# Patient Record
Sex: Male | Born: 1960 | Race: Black or African American | Marital: Married | Smoking: Former smoker
Health system: Southern US, Community
[De-identification: ages and names within clinical notes are randomized; demographics above are authoritative.]

## PROBLEM LIST (undated history)

## (undated) DIAGNOSIS — K649 Unspecified hemorrhoids: Secondary | ICD-10-CM

## (undated) DIAGNOSIS — E785 Hyperlipidemia, unspecified: Secondary | ICD-10-CM

## (undated) DIAGNOSIS — B019 Varicella without complication: Secondary | ICD-10-CM

## (undated) DIAGNOSIS — I00 Rheumatic fever without heart involvement: Secondary | ICD-10-CM

## (undated) DIAGNOSIS — N390 Urinary tract infection, site not specified: Secondary | ICD-10-CM

## (undated) DIAGNOSIS — E119 Type 2 diabetes mellitus without complications: Secondary | ICD-10-CM

## (undated) HISTORY — DX: Varicella without complication: B01.9

## (undated) HISTORY — PX: COLONOSCOPY: SHX174

## (undated) HISTORY — DX: Hyperlipidemia, unspecified: E78.5

## (undated) HISTORY — DX: Type 2 diabetes mellitus without complications: E11.9

## (undated) HISTORY — DX: Rheumatic fever without heart involvement: I00

## (undated) HISTORY — DX: Unspecified hemorrhoids: K64.9

## (undated) HISTORY — DX: Urinary tract infection, site not specified: N39.0

---

## 2012-03-25 ENCOUNTER — Other Ambulatory Visit: Payer: Self-pay | Admitting: Occupational Medicine

## 2012-03-25 ENCOUNTER — Ambulatory Visit: Payer: Self-pay

## 2012-03-25 DIAGNOSIS — Z021 Encounter for pre-employment examination: Secondary | ICD-10-CM

## 2014-06-09 ENCOUNTER — Encounter: Payer: Self-pay | Admitting: Family

## 2014-06-09 ENCOUNTER — Telehealth: Payer: Self-pay | Admitting: Family

## 2014-06-09 ENCOUNTER — Ambulatory Visit (INDEPENDENT_AMBULATORY_CARE_PROVIDER_SITE_OTHER): Payer: 59 | Admitting: Family

## 2014-06-09 ENCOUNTER — Other Ambulatory Visit (INDEPENDENT_AMBULATORY_CARE_PROVIDER_SITE_OTHER): Payer: 59

## 2014-06-09 VITALS — BP 142/92 | HR 73 | Temp 97.5°F | Resp 18 | Wt 226.0 lb

## 2014-06-09 DIAGNOSIS — M79644 Pain in right finger(s): Secondary | ICD-10-CM

## 2014-06-09 DIAGNOSIS — Z800 Family history of malignant neoplasm of digestive organs: Secondary | ICD-10-CM | POA: Insufficient documentation

## 2014-06-09 DIAGNOSIS — R3589 Other polyuria: Secondary | ICD-10-CM | POA: Insufficient documentation

## 2014-06-09 DIAGNOSIS — R358 Other polyuria: Secondary | ICD-10-CM

## 2014-06-09 DIAGNOSIS — M79646 Pain in unspecified finger(s): Secondary | ICD-10-CM | POA: Insufficient documentation

## 2014-06-09 LAB — PSA: PSA: 0.98 ng/mL (ref 0.10–4.00)

## 2014-06-09 LAB — HEMOGLOBIN A1C: Hgb A1c MFr Bld: 6.6 % — ABNORMAL HIGH (ref 4.6–6.5)

## 2014-06-09 NOTE — Telephone Encounter (Signed)
LVM for pt to call back.

## 2014-06-09 NOTE — Patient Instructions (Addendum)
Thank you for choosing [facility].  Summary/Instructions:  Please stop by the lab on the basement level of the building for your blood work. Your results will be released to [facility] (or called to you) after review, usually within 72 hours after test completion. If any changes need to be made, you will be notified at that same time.  Referrals have been made during this visit. You should expect to hear back from our schedulers in about 10-14 days in regards to establishing an appointment with the specialists we discussed.   If your symptoms worsen or fail to improve, please contact our office for further instruction, or in case of emergency go directly to the emergency room at the closest medical facility.    Urinary Frequency The number of times a normal person urinates depends upon how much liquid they take in and how much liquid they are losing. If the temperature is hot and there is high humidity, then the person will sweat more and usually breathe a little more frequently. These factors decrease the amount of frequency of urination that would be considered normal. The amount you drink is easily determined, but the amount of fluid lost is sometimes more difficult to calculate.  Fluid is lost in two ways:  Sensible fluid loss is usually measured by the amount of urine that you get rid of. Losses of fluid can also occur with diarrhea.  Insensible fluid loss is more difficult to measure. It is caused by evaporation. Insensible loss of fluid occurs through breathing and sweating. It usually ranges from a little less than a quart to a little more than a quart of fluid a day. In normal temperatures and activity levels, the average person may urinate 4 to 7 times in a 24-hour period. Needing to urinate more often than that could indicate a problem. If one urinates 4 to 7 times in 24 hours and has large volumes each time, that could indicate a different problem from one who urinates 4 to 7  times a day and has small volumes. The time of urinating is also important. Most urinating should be done during the waking hours. Getting up at night to urinate frequently can indicate some problems. CAUSES  The bladder is the organ in your lower abdomen that holds urine. Like a balloon, it swells some as it fills up. Your nerves sense this and tell you it is time to head for the bathroom. There are a number of reasons that you might feel the need to urinate more often than usual. They include:  Urinary tract infection. This is usually associated with other signs such as burning when you urinate.  In men, problems with the prostate (a walnut-size gland that is located near the tube that carries urine out of your body). There are two reasons why the prostate can cause an increased frequency of urination:  An enlarged prostate that does not let the bladder empty well. If the bladder only half empties when you urinate, then it only has half the capacity to fill before you have to urinate again.  The nerves in the bladder become more hypersensitive with an increased size of the prostate even if the bladder empties completely.  Pregnancy.  Obesity. Excess weight is more likely to cause a problem for women than for men.  Bladder stones or other bladder problems.  Caffeine.  Alcohol.  Medications. For example, drugs that help the body get rid of extra fluid (diuretics) increase urine production. Some other medicines  must be taken with lots of fluids.  Muscle or nerve weakness. This might be the result of a spinal cord injury, a stroke, multiple sclerosis, or Parkinson disease.  Long-standing diabetes can decrease the sensation of the bladder. This loss of sensation makes it harder to sense the bladder needs to be emptied. Over a period of years, the bladder is stretched out by constant overfilling. This weakens the bladder muscles so that the bladder does not empty well and has less capacity to  fill with new urine.  Interstitial cystitis (also called painful bladder syndrome). This condition develops because the tissues that line the inside of the bladder are inflamed (inflammation is the body's way of reacting to injury or infection). It causes pain and frequent urination. It occurs in women more often than in men. DIAGNOSIS   To decide what might be causing your urinary frequency, your health care provider will probably:  Ask about symptoms you have noticed.  Ask about your overall health. This will include questions about any medications you are taking.  Do a physical examination.  Order some tests. These might include:  A blood test to check for diabetes or other health issues that could be contributing to the problem.  Urine testing. This could measure the flow of urine and the pressure on the bladder.  A test of your neurological system (the brain, spinal cord, and nerves). This is the system that senses the need to urinate.  A bladder test to check whether it is emptying completely when you urinate.  Cystoscopy. This test uses a thin tube with a tiny camera on it. It offers a look inside your urethra and bladder to see if there are problems.  Imaging tests. You might be given a contrast dye and then asked to urinate. X-rays are taken to see how your bladder is working. TREATMENT  It is important for you to be evaluated to determine if the amount or frequency that you have is unusual or abnormal. If it is found to be abnormal, the cause should be determined and this can usually be found out easily. Depending upon the cause, treatment could include medication, stimulation of the nerves, or surgery. There are not too many things that you can do as an individual to change your urinary frequency. It is important that you balance the amount of fluid intake needed to compensate for your activity and the temperature. Medical problems will be diagnosed and taken care of by your  physician. There is no particular bladder training such as Kegel exercises that you can do to help urinary frequency. This is an exercise that is usually recommended for people who have leaking of urine when they laugh, cough, or sneeze. HOME CARE INSTRUCTIONS   Take any medications your health care provider prescribed or suggested. Follow the directions carefully.  Practice any lifestyle changes that are recommended. These might include:  Drinking less fluid or drinking at different times of the day. If you need to urinate often during the night, for example, you may need to stop drinking fluids early in the evening.  Cutting down on caffeine or alcohol. They both can make you need to urinate more often than normal. Caffeine is found in coffee, tea, and sodas.  Losing weight, if that is recommended.  Keep a journal or a log. You might be asked to record how much you drink and when and where you feel the need to urinate. This will also help evaluate how well the treatment  provided by your physician is working. SEEK MEDICAL CARE IF:   Your need to urinate often gets worse.  You feel increased pain or irritation when you urinate.  You notice blood in your urine.  You have questions about any medications that your health care provider recommended.  You notice blood, pus, or swelling at the site of any test or treatment procedure.  You develop a fever of more than 100.61F (38.1C). SEEK IMMEDIATE MEDICAL CARE IF:  You develop a fever of more than 102.98F (38.9C). Document Released: 12/16/2008 Document Revised: 07/06/2013 Document Reviewed: 12/16/2008 [facility] Patient Information 2015 [place], [place]. This information is not intended to replace advice given to you by your health care provider. Make sure you discuss any questions you have with your health care provider.

## 2014-06-09 NOTE — Progress Notes (Signed)
Subjective:    [name], male    DOB: 22-Sep-1960, 54 y.o.   MRN: [id number]  Chief Complaint  Patient presents with  . Establish Care    says he needs colonoscopy, wants to make sure he is not diabetic, also has numbness in right thumb and has pain that runs through it when trying to extend arm    HPI:  Austin Meadows is a 54 y.o. male who presents today to establish care and discuss several concerns.  1) Need for colonoscopy - Has a significant family history for colon cancer; Last colonoscopy was bout 7 years ago and notes it was normal. Recommend for him to follow up in about 5 years. Would like to be referred for colonoscopy  2) Concern for diabetes - Associated symptoms of frequent urination and some fatigue has been going on for about the last year. Family history is significant for father with Type II diabetes. Also notes that his vision gets blurry on occasion when he is in front of monitors/TV too long.  3) Thumb pain - Associated symptom of pain located in the his right thumb has been going on for about 2 months. Describes the pain as shooting on occasion and notes it most when extends his thumb and arm. Pain is there for a moment and then goes away. Denies any trauma to his thumb or upper extremity.   Allergies  Allergen Reactions  . Penicillins     No current outpatient prescriptions on file prior to visit.   No current facility-administered medications on file prior to visit.    Past Medical History  Diagnosis Date  . Chicken pox   . Jaundice of newborn   . Hyperlipidemia   . Rheumatic fever   . UTI (lower urinary tract infection)     History reviewed. No pertinent past surgical history.  Family History  Problem Relation Age of Onset  . Arthritis Mother   . Colon cancer Father   . Prostate cancer Father   . Heart disease Father   . Hypertension Father   . Diabetes Father     History   Social History  . Marital Status: Married    Spouse  Name: N/A  . Number of Children: N/A  . Years of Education: N/A   Occupational History  . Chemical Operator    Social History Main Topics  . Smoking status: Former Smoker    Quit date: 06/04/1994  . Smokeless tobacco: Never Used  . Alcohol Use: 0.0 oz/week    0 Standard drinks or equivalent per week     Comment: Occasionally  . Drug Use: No  . Sexual Activity: Not on file   Other Topics Concern  . Not on file   Social History Narrative    Review of Systems  Constitutional: Positive for fatigue. Negative for fever and chills.  Eyes:       Positive for changes in his vision when looking at screens for long periods.   Endocrine: Positive for polyuria. Negative for polydipsia and polyphagia.  Musculoskeletal:       Positive for pain in right thumb.   Neurological: Negative for numbness.      Objective:    BP 142/92 mmHg  Pulse 73  Temp(Src) 97.5 F (36.4 C) (Oral)  Resp 18  Wt 226 lb (102.513 kg)  SpO2 98% Nursing note and vital signs reviewed.  Physical Exam  Constitutional: He is oriented to person, place, and time. He appears well-developed  and well-nourished. No distress.  Cardiovascular: Normal rate, regular rhythm, normal heart sounds and intact distal pulses.   Pulmonary/Chest: Effort normal and breath sounds normal.  Musculoskeletal:  Right thumb: No obvious deformity, discoloration, or edema of right thumb noted. No palpable tenderness able to be elicited. Range of motion is complete. Strength is 5+. Ligamentous testing is negative. Negative Finkelstein's. Unable to elicit symptoms described by patient.  Neurological: He is alert and oriented to person, place, and time.  Skin: Skin is warm and dry.  Psychiatric: He has a normal mood and affect. His behavior is normal. Judgment and thought content normal.       Assessment & Plan:

## 2014-06-09 NOTE — Telephone Encounter (Signed)
Please inform the patient that his PSA was normal and his A1c which checks for diabetes was 6.6 indicating that he does have Type 2 diabetes. For this I have started him on Metformin will be sent to his pharmacy, but we need to know which one. I would like him to follow up in 2 weeks to discuss this diagnosis.

## 2014-06-09 NOTE — Progress Notes (Signed)
Pre visit review using our clinic review tool, if applicable. No additional management support is needed unless otherwise documented below in the visit note. 

## 2014-06-09 NOTE — Assessment & Plan Note (Signed)
Obtain A1c and PSA to rule out diabetes and prostate as cause of urinary frequency. Discussed caffeine usage and to decrease caffeine as much as possible. Follow-up pending lab work.

## 2014-06-09 NOTE — Assessment & Plan Note (Signed)
No obvious cause for patient's sharp right thumb pain. Continue to monitor at this time with no obvious pathology. If symptoms worsen or fail to improve we'll consider imaging.

## 2014-06-09 NOTE — Assessment & Plan Note (Signed)
Patient has family history of colon cancer. Last colonoscopy was approximately 7 years ago. Refer to GI for Colonoscopy.

## 2014-06-10 MED ORDER — METFORMIN HCL 500 MG PO TABS: 500.0000 mg | ORAL_TABLET | Freq: Two times a day (BID) | ORAL | Status: DC

## 2014-06-10 NOTE — Telephone Encounter (Signed)
Pt aware of results. I have put in the pharmacy that he would like metformin sent into.

## 2014-06-10 NOTE — Telephone Encounter (Signed)
Metformin sent to listed pharmacy.

## 2014-06-24 ENCOUNTER — Ambulatory Visit (INDEPENDENT_AMBULATORY_CARE_PROVIDER_SITE_OTHER): Payer: 59 | Admitting: Family

## 2014-06-24 ENCOUNTER — Encounter: Payer: Self-pay | Admitting: Family

## 2014-06-24 DIAGNOSIS — E1165 Type 2 diabetes mellitus with hyperglycemia: Secondary | ICD-10-CM | POA: Insufficient documentation

## 2014-06-24 DIAGNOSIS — E119 Type 2 diabetes mellitus without complications: Secondary | ICD-10-CM

## 2014-06-24 LAB — GLUCOSE, POCT (MANUAL RESULT ENTRY): POC Glucose: 89 mg/dl (ref 70–99)

## 2014-06-24 MED ORDER — GLUCOSE BLOOD VI STRP: ORAL_STRIP | Status: DC

## 2014-06-24 MED ORDER — METFORMIN HCL 500 MG PO TABS: 500.0000 mg | ORAL_TABLET | Freq: Two times a day (BID) | ORAL | Status: DC

## 2014-06-24 NOTE — Assessment & Plan Note (Signed)
In office blood sugar was 89. Patient indicates some improvement with the polyuria. He is due for diabetic eye exam. Referral to ophthalmology placed. Diabetic foot exam completed. One Touch meter provided to patient with instructions on use. Discussed preventative exams and goals of preventing end organ damage by controlling blood sugar. Follow-up A1c in 3 months.

## 2014-06-24 NOTE — Progress Notes (Signed)
   Subjective:    [name], male    DOB: 10-20-1960, 54 y.o.   MRN: [id number]  Chief Complaint  Patient presents with  . Follow-up    Diabetes     HPI:  Austin Meadows is a 54 y.o. male with a PMH of Type 2 diabetes who presents today for follow up.  Previously newly diagnosed with diabetes with an A1c of 6.6, who was experiencing polyuria and started on metformin. Indicates that he takes his medication as prescribed. Denies any adverse effects of the medication. He indicates that he does have some improvement in his polyuria previously experienced.   Allergies  Allergen Reactions  . Penicillins     Current Outpatient Prescriptions on File Prior to Visit  Medication Sig Dispense Refill  . metFORMIN (GLUCOPHAGE) 500 MG tablet Take 1 tablet (500 mg total) by mouth 2 (two) times daily with a meal. 60 tablet 0   No current facility-administered medications on file prior to visit.    Review of Systems  Eyes:       Negative for changes in vision  Respiratory: Negative for apnea and shortness of breath.   Cardiovascular: Negative for chest pain, palpitations and leg swelling.  Endocrine: Negative for polydipsia and polyphagia.  Neurological: Negative for numbness.      Objective:    BP 138/88 mmHg  Pulse 78  Temp(Src) 98.6 F (37 C) (Oral)  Resp 18  Ht 5\' 11"  (1.803 m)  Wt 226 lb 1.9 oz (102.567 kg)  BMI 31.55 kg/m2  SpO2 97% Nursing note and vital signs reviewed.  Physical Exam  Constitutional: He is oriented to person, place, and time. He appears well-developed and well-nourished. No distress.  Cardiovascular: Normal rate, regular rhythm, normal heart sounds and intact distal pulses.   Pulmonary/Chest: Effort normal and breath sounds normal.  Neurological: He is alert and oriented to person, place, and time.  Diabetic foot exam - bilateral feet are free from skin breakdown, cuts, and abrasions. Toenails are neatly trimmed. Pulses are intact and appropriate.  Sensation is intact to monofilament bilaterally.  Skin: Skin is warm and dry.  Psychiatric: He has a normal mood and affect. His behavior is normal. Judgment and thought content normal.       Assessment & Plan:

## 2014-06-24 NOTE — Progress Notes (Signed)
Pre visit review using our clinic review tool, if applicable. No additional management support is needed unless otherwise documented below in the visit note. 

## 2014-06-24 NOTE — Patient Instructions (Addendum)
Thank you for choosing [facility].  Summary/Instructions:  Please take your blood sugar 1-2 times per week in the morning.  Anything less than 130 is good and less than 100 is great!  Type 2 Diabetes Mellitus Type 2 diabetes mellitus, often simply referred to as type 2 diabetes, is a long-lasting (chronic) disease. In type 2 diabetes, the pancreas does not make enough insulin (a hormone), the cells are less responsive to the insulin that is made (insulin resistance), or both. Normally, insulin moves sugars from food into the tissue cells. The tissue cells use the sugars for energy. The lack of insulin or the lack of normal response to insulin causes excess sugars to build up in the blood instead of going into the tissue cells. As a result, high blood sugar (hyperglycemia) develops. The effect of high sugar (glucose) levels can cause many complications. Type 2 diabetes was also previously called adult-onset diabetes, but it can occur at any age.  RISK FACTORS  A person is predisposed to developing type 2 diabetes if someone in the family has the disease and also has one or more of the following primary risk factors:  Overweight.  An inactive lifestyle.  A history of consistently eating high-calorie foods. Maintaining a normal weight and regular physical activity can reduce the chance of developing type 2 diabetes. SYMPTOMS  A person with type 2 diabetes may not show symptoms initially. The symptoms of type 2 diabetes appear slowly. The symptoms include:  Increased thirst (polydipsia).  Increased urination (polyuria).  Increased urination during the night (nocturia).  Weight loss. This weight loss may be rapid.  Frequent, recurring infections.  Tiredness (fatigue).  Weakness.  Vision changes, such as blurred vision.  Fruity smell to your breath.  Abdominal pain.  Nausea or vomiting.  Cuts or bruises which are slow to heal.  Tingling or numbness in the hands or  feet. DIAGNOSIS Type 2 diabetes is frequently not diagnosed until complications of diabetes are present. Type 2 diabetes is diagnosed when symptoms or complications are present and when blood glucose levels are increased. Your blood glucose level may be checked by one or more of the following blood tests:  A fasting blood glucose test. You will not be allowed to eat for at least 8 hours before a blood sample is taken.  A random blood glucose test. Your blood glucose is checked at any time of the day regardless of when you ate.  A hemoglobin A1c blood glucose test. A hemoglobin A1c test provides information about blood glucose control over the previous 3 months.  An oral glucose tolerance test (OGTT). Your blood glucose is measured after you have not eaten (fasted) for 2 hours and then after you drink a glucose-containing beverage. TREATMENT   You may need to take insulin or diabetes medicine daily to keep blood glucose levels in the desired range.  If you use insulin, you may need to adjust the dosage depending on the carbohydrates that you eat with each meal or snack. The treatment goal is to maintain the before meal blood sugar (preprandial glucose) level at 70-130 mg/dL. HOME CARE INSTRUCTIONS   Have your hemoglobin A1c level checked twice a year.  Perform daily blood glucose monitoring as directed by your health care provider.  Monitor urine ketones when you are ill and as directed by your health care provider.  Take your diabetes medicine or insulin as directed by your health care provider to maintain your blood glucose levels in the desired range.  Never run out of diabetes medicine or insulin. It is needed every day.  If you are using insulin, you may need to adjust the amount of insulin given based on your intake of carbohydrates. Carbohydrates can raise blood glucose levels but need to be included in your diet. Carbohydrates provide vitamins, minerals, and fiber which are an  essential part of a healthy diet. Carbohydrates are found in fruits, vegetables, whole grains, dairy products, legumes, and foods containing added sugars.  Eat healthy foods. You should make an appointment to see a registered dietitian to help you create an eating plan that is right for you.  Lose weight if you are overweight.  Carry a medical alert card or wear your medical alert jewelry.  Carry a 15-gram carbohydrate snack with you at all times to treat low blood glucose (hypoglycemia). Some examples of 15-gram carbohydrate snacks include:  Glucose tablets, 3 or 4.  Glucose gel, 15-gram tube.  Raisins, 2 tablespoons (24 grams).  Jelly beans, 6.  Animal crackers, 8.  Regular pop, 4 ounces (120 mL).  Gummy treats, 9.  Recognize hypoglycemia. Hypoglycemia occurs with blood glucose levels of 70 mg/dL and below. The risk for hypoglycemia increases when fasting or skipping meals, during or after intense exercise, and during sleep. Hypoglycemia symptoms can include:  Tremors or shakes.  Decreased ability to concentrate.  Sweating.  Increased heart rate.  Headache.  Dry mouth.  Hunger.  Irritability.  Anxiety.  Restless sleep.  Altered speech or coordination.  Confusion.  Treat hypoglycemia promptly. If you are alert and able to safely swallow, follow the 15:15 rule:  Take 15-20 grams of rapid-acting glucose or carbohydrate. Rapid-acting options include glucose gel, glucose tablets, or 4 ounces (120 mL) of fruit juice, regular soda, or low-fat milk.  Check your blood glucose level 15 minutes after taking the glucose.  Take 15-20 grams more of glucose if the repeat blood glucose level is still 70 mg/dL or below.  Eat a meal or snack within 1 hour once blood glucose levels return to normal.  Be alert to feeling very thirsty and urinating more frequently than usual, which are early signs of hyperglycemia. An early awareness of hyperglycemia allows for prompt  treatment. Treat hyperglycemia as directed by your health care provider.  Engage in at least 150 minutes of moderate-intensity physical activity a week, spread over at least 3 days of the week or as directed by your health care provider. In addition, you should engage in resistance exercise at least 2 times a week or as directed by your health care provider. Try to spend no more than 90 minutes at one time inactive.  Adjust your medicine and food intake as needed if you start a new exercise or sport.  Follow your sick-day plan anytime you are unable to eat or drink as usual.  Do not use any tobacco products including cigarettes, chewing tobacco, or electronic cigarettes. If you need help quitting, ask your health care provider.  Limit alcohol intake to no more than 1 drink per day for nonpregnant women and 2 drinks per day for men. You should drink alcohol only when you are also eating food. Talk with your health care provider whether alcohol is safe for you. Tell your health care provider if you drink alcohol several times a week.  Keep all follow-up visits as directed by your health care provider. This is important.  Schedule an eye exam soon after the diagnosis of type 2 diabetes and then annually.  Perform  daily skin and foot care. Examine your skin and feet daily for cuts, bruises, redness, nail problems, bleeding, blisters, or sores. A foot exam by a health care provider should be done annually.  Brush your teeth and gums at least twice a day and floss at least once a day. Follow up with your dentist regularly.  Share your diabetes management plan with your workplace or school.  Stay up-to-date with immunizations. It is recommended that people with diabetes who are over 27 years old get the pneumonia vaccine. In some cases, two separate shots may be given. Ask your health care provider if your pneumonia vaccination is up-to-date.  Learn to manage stress.  Obtain ongoing diabetes  education and support as needed.  Participate in or seek rehabilitation as needed to maintain or improve independence and quality of life. Request a physical or occupational therapy referral if you are having foot or hand numbness, or difficulties with grooming, dressing, eating, or physical activity. SEEK MEDICAL CARE IF:   You are unable to eat food or drink fluids for more than 6 hours.  You have nausea and vomiting for more than 6 hours.  Your blood glucose level is over 240 mg/dL.  There is a change in mental status.  You develop an additional serious illness.  You have diarrhea for more than 6 hours.  You have been sick or have had a fever for a couple of days and are not getting better.  You have pain during any physical activity.  SEEK IMMEDIATE MEDICAL CARE IF:  You have difficulty breathing.  You have moderate to large ketone levels. MAKE SURE YOU:  Understand these instructions.  Will watch your condition.  Will get help right away if you are not doing well or get worse. Document Released: 02/19/2005 Document Revised: 07/06/2013 Document Reviewed: 09/18/2011 [facility] Patient Information 2015 [place], [place]. This information is not intended to replace advice given to you by your health care provider. Make sure you discuss any questions you have with your health care provider.  Diabetes and Exercise Exercising regularly is important. It is not just about losing weight. It has many health benefits, such as:  Improving your overall fitness, flexibility, and endurance.  Increasing your bone density.  Helping with weight control.  Decreasing your body fat.  Increasing your muscle strength.  Reducing stress and tension.  Improving your overall health. People with diabetes who exercise gain additional benefits because exercise:  Reduces appetite.  Improves the body's use of blood sugar (glucose).  Helps lower or control blood glucose.  Decreases blood  pressure.  Helps control blood lipids (such as cholesterol and triglycerides).  Improves the body's use of the hormone insulin by:  Increasing the body's insulin sensitivity.  Reducing the body's insulin needs.  Decreases the risk for heart disease because exercising:  Lowers cholesterol and triglycerides levels.  Increases the levels of good cholesterol (such as high-density lipoproteins [HDL]) in the body.  Lowers blood glucose levels. YOUR ACTIVITY PLAN  Choose an activity that you enjoy and set realistic goals. Your health care provider or diabetes educator can help you make an activity plan that works for you. Exercise regularly as directed by your health care provider. This includes:  Performing resistance training twice a week such as push-ups, sit-ups, lifting weights, or using resistance bands.  Performing 150 minutes of cardio exercises each week such as walking, running, or playing sports.  Staying active and spending no more than 90 minutes at one time being inactive.  Even short bursts of exercise are good for you. Three 10-minute sessions spread throughout the day are just as beneficial as a single 30-minute session. Some exercise ideas include:  Taking the dog for a walk.  Taking the stairs instead of the elevator.  Dancing to your favorite song.  Doing an exercise video.  Doing your favorite exercise with a friend. RECOMMENDATIONS FOR EXERCISING WITH TYPE 1 OR TYPE 2 DIABETES   Check your blood glucose before exercising. If blood glucose levels are greater than 240 mg/dL, check for urine ketones. Do not exercise if ketones are present.  Avoid injecting insulin into areas of the body that are going to be exercised. For example, avoid injecting insulin into:  The arms when playing tennis.  The legs when jogging.  Keep a record of:  Food intake before and after you exercise.  Expected peak times of insulin action.  Blood glucose levels before and after  you exercise.  The type and amount of exercise you have done.  Review your records with your health care provider. Your health care provider will help you to develop guidelines for adjusting food intake and insulin amounts before and after exercising.  If you take insulin or oral hypoglycemic agents, watch for signs and symptoms of hypoglycemia. They include:  Dizziness.  Shaking.  Sweating.  Chills.  Confusion.  Drink plenty of water while you exercise to prevent dehydration or heat stroke. Body water is lost during exercise and must be replaced.  Talk to your health care provider before starting an exercise program to make sure it is safe for you. Remember, almost any type of activity is better than none. Document Released: 05/12/2003 Document Revised: 07/06/2013 Document Reviewed: 07/29/2012 [facility] Patient Information 2015 [place], [place]. This information is not intended to replace advice given to you by your health care provider. Make sure you discuss any questions you have with your health care provider.

## 2014-06-25 ENCOUNTER — Encounter: Payer: Self-pay | Admitting: Family

## 2014-07-22 ENCOUNTER — Ambulatory Visit (AMBULATORY_SURGERY_CENTER): Payer: Self-pay | Admitting: *Deleted

## 2014-07-22 VITALS — Ht 72.0 in | Wt 223.0 lb

## 2014-07-22 DIAGNOSIS — Z800 Family history of malignant neoplasm of digestive organs: Secondary | ICD-10-CM

## 2014-07-22 MED ORDER — NA SULFATE-K SULFATE-MG SULF 17.5-3.13-1.6 GM/177ML PO SOLN: ORAL | Status: DC

## 2014-07-22 NOTE — Progress Notes (Signed)
Patient denies any allergies to eggs or soy. Patient denies any problems with anesthesia/sedation. Patient denies any oxygen use at home and does not take any diet/weight loss medications. EMMI education assisgned to patient on colonoscopy, this was explained and instructions given to patient. 

## 2014-08-05 ENCOUNTER — Encounter: Payer: Self-pay | Admitting: Gastroenterology

## 2014-08-05 ENCOUNTER — Ambulatory Visit (AMBULATORY_SURGERY_CENTER): Payer: 59 | Admitting: Gastroenterology

## 2014-08-05 VITALS — BP 138/61 | HR 69 | Temp 97.1°F | Resp 16 | Ht 72.0 in | Wt 223.0 lb

## 2014-08-05 DIAGNOSIS — Z800 Family history of malignant neoplasm of digestive organs: Secondary | ICD-10-CM | POA: Diagnosis present

## 2014-08-05 DIAGNOSIS — Z1211 Encounter for screening for malignant neoplasm of colon: Secondary | ICD-10-CM

## 2014-08-05 MED ORDER — SODIUM CHLORIDE 0.9 % IV SOLN: 500.0000 mL | INTRAVENOUS | Status: DC

## 2014-08-05 NOTE — Patient Instructions (Signed)
YOU HAD AN ENDOSCOPIC PROCEDURE TODAY AT THE [facility]:   Refer to the procedure report that was given to you for any specific questions about what was found during the examination.  If the procedure report does not answer your questions, please call your gastroenterologist to clarify.  If you requested that your care partner not be given the details of your procedure findings, then the procedure report has been included in a sealed envelope for you to review at your convenience later.  YOU SHOULD EXPECT: Some feelings of bloating in the abdomen. Passage of more gas than usual.  Walking can help get rid of the air that was put into your GI tract during the procedure and reduce the bloating. If you had a lower endoscopy (such as a colonoscopy or flexible sigmoidoscopy) you may notice spotting of blood in your stool or on the toilet paper. If you underwent a bowel prep for your procedure, you may not have a normal bowel movement for a few days.  Please Note:  You might notice some irritation and congestion in your nose or some drainage.  This is from the oxygen used during your procedure.  There is no need for concern and it should clear up in a day or so.  SYMPTOMS TO REPORT IMMEDIATELY:   Following lower endoscopy (colonoscopy or flexible sigmoidoscopy):  Excessive amounts of blood in the stool  Significant tenderness or worsening of abdominal pains  Swelling of the abdomen that is new, acute  Fever of 100F or higher   For urgent or emergent issues, a gastroenterologist can be reached at any hour by calling [phone].   DIET: Your first meal following the procedure should be a small meal and then it is ok to progress to your normal diet. Heavy or fried foods are harder to digest and may make you feel nauseous or bloated.  Likewise, meals heavy in dairy and vegetables can increase bloating.  Drink plenty of fluids but you should avoid alcoholic beverages for 24  hours.  ACTIVITY:  You should plan to take it easy for the rest of today and you should NOT DRIVE or use heavy machinery until tomorrow (because of the sedation medicines used during the test).    FOLLOW UP: Our staff will call the number listed on your records the next business day following your procedure to check on you and address any questions or concerns that you may have regarding the information given to you following your procedure. If we do not reach you, we will leave a message.  However, if you are feeling well and you are not experiencing any problems, there is no need to return our call.  We will assume that you have returned to your regular daily activities without incident.  If any biopsies were taken you will be contacted by phone or by letter within the next 1-3 weeks.  Please call us at [phone] if you have not heard about the biopsies in 3 weeks.    SIGNATURES/CONFIDENTIALITY: You and/or your care partner have signed paperwork which will be entered into your electronic medical record.  These signatures attest to the fact that that the information above on your After Visit Summary has been reviewed and is understood.  Full responsibility of the confidentiality of this discharge information lies with you and/or your care-partner. 

## 2014-08-05 NOTE — Op Note (Signed)
[place]  [address] [place], [id number]   COLONOSCOPY PROCEDURE REPORT  PATIENT: Austin Meadows, Austin Meadows  MR#: [id number] BIRTHDATE: 19-Mar-1960 , 53  yrs. old GENDER: male ENDOSCOPIST: Ladene Artist, MD, [facility] REFERRED BY: Mauricio Po, FNP PROCEDURE DATE:  08/05/2014 PROCEDURE:   Colonoscopy, screening First Screening Colonoscopy - Avg.  risk and is 50 yrs.  old or older - No.  Prior Negative Screening - Now for repeat screening. 10 or more years since last screening  History of Adenoma - Now for follow-up colonoscopy & has been > or = to 3 yrs.  N/A  Polyps removed today? No Recommend repeat exam, <10 yrs? Yes high risk ASA CLASS:   Class II INDICATIONS:Screening for colonic neoplasia and FH Colon or Rectal Adenocarcinoma. MEDICATIONS: Monitored anesthesia care and Propofol 200 mg IV DESCRIPTION OF PROCEDURE:   After the risks benefits and alternatives of the procedure were thoroughly explained, informed consent was obtained.  The digital rectal exam revealed no abnormalities of the rectum and revealed external hemorrhoids. The LB Olympus Loaner [id number]  endoscope was introduced through the anus and advanced to the cecum, which was identified by both the appendix and ileocecal valve. No adverse events experienced.   The quality of the prep was excellent.  (Suprep was used)  The instrument was then slowly withdrawn as the colon was fully examined. Estimated blood loss is zero unless otherwise noted in this procedure report.    COLON FINDINGS: A normal appearing cecum, ileocecal valve, and appendiceal orifice were identified.  The ascending, transverse, descending, sigmoid colon, and rectum appeared unremarkable. Retroflexed views revealed internal Grade I hemorrhoids and Retroflexed views revealed external hemorrhoids. The time to cecum = 0.9 Withdrawal time = 8.8   The scope was withdrawn and the procedure completed. COMPLICATIONS: There were no immediate  complications.  ENDOSCOPIC IMPRESSION: 1.  Normal colonoscopy 2.  Internal and external hemorrhoids  RECOMMENDATIONS: 1.  Repeat Colonoscopy in 5 years.  eSigned:  Ladene Artist, MD, [facility] 08/05/2014 9:15 AM

## 2014-08-05 NOTE — Progress Notes (Signed)
Report to PACU, RN, vss, BBS= Clear.  

## 2014-08-06 ENCOUNTER — Telehealth: Payer: Self-pay | Admitting: *Deleted

## 2014-08-06 NOTE — Telephone Encounter (Signed)
  Follow up Call-  No answer, left message to call if questions or concerns.    

## 2014-11-02 ENCOUNTER — Other Ambulatory Visit: Payer: Self-pay | Admitting: Family

## 2014-11-30 ENCOUNTER — Encounter: Payer: Self-pay | Admitting: Family

## 2014-11-30 ENCOUNTER — Other Ambulatory Visit (INDEPENDENT_AMBULATORY_CARE_PROVIDER_SITE_OTHER): Payer: 59

## 2014-11-30 ENCOUNTER — Ambulatory Visit (INDEPENDENT_AMBULATORY_CARE_PROVIDER_SITE_OTHER): Payer: 59 | Admitting: Family

## 2014-11-30 VITALS — BP 128/94 | HR 85 | Temp 98.7°F | Resp 18 | Ht 71.0 in | Wt 222.6 lb

## 2014-11-30 DIAGNOSIS — R202 Paresthesia of skin: Secondary | ICD-10-CM | POA: Diagnosis not present

## 2014-11-30 DIAGNOSIS — R200 Anesthesia of skin: Secondary | ICD-10-CM

## 2014-11-30 LAB — CBC
HEMATOCRIT: 45.7 % (ref 39.0–52.0)
HEMOGLOBIN: 15.1 g/dL (ref 13.0–17.0)
MCHC: 33.2 g/dL (ref 30.0–36.0)
MCV: 83.8 fl (ref 78.0–100.0)
PLATELETS: 255 10*3/uL (ref 150.0–400.0)
RBC: 5.45 Mil/uL (ref 4.22–5.81)
RDW: 13.6 % (ref 11.5–15.5)
WBC: 7.1 10*3/uL (ref 4.0–10.5)

## 2014-11-30 LAB — TSH: TSH: 2.03 u[IU]/mL (ref 0.35–4.50)

## 2014-11-30 LAB — IBC PANEL
Iron: 84 ug/dL (ref 42–165)
Saturation Ratios: 20.2 % (ref 20.0–50.0)
Transferrin: 297 mg/dL (ref 212.0–360.0)

## 2014-11-30 LAB — FERRITIN: Ferritin: 76.2 ng/mL (ref 22.0–322.0)

## 2014-11-30 LAB — B12 AND FOLATE PANEL
Folate: 21.5 ng/mL (ref >5.9)
Vitamin B-12: 325 pg/mL (ref 211–911)

## 2014-11-30 LAB — HEMOGLOBIN A1C: Hgb A1c MFr Bld: 6 % (ref 4.6–6.5)

## 2014-11-30 NOTE — Patient Instructions (Addendum)
Thank you for choosing [facility].  Summary/Instructions:   Please stop by the lab on the basement level of the building for your blood work. Your results will be released to [place] (or called to you) after review, usually within 72 hours after test completion. If any changes need to be made, you will be notified at that same time.  If your symptoms worsen or fail to improve, please contact our office for further instruction, or in case of emergency go directly to the emergency room at the closest medical facility.   Vitamin B12 Deficiency Not having enough vitamin B12 is called a deficiency. Vitamin B12 is an important vitamin. Your body needs vitamin B12 to:   Make red blood cells.  Make DNA. This is the genetic material inside all of your cells.  Help your nerves work properly so they can carry messages from your brain to your body. CAUSES  Not eating enough foods that contain vitamin B12.  Not having enough stomach acid and digestive juices. The body needs these to absorb vitamin B12 from the food you eat.  Having certain digestive system diseases that make it hard to absorb vitamin B12. These diseases include Crohn's disease, chronic pancreatitis, and cystic fibrosis.  Having pernicious anemia, which is a condition where the body has too few red blood cells. People with this condition do not make enough of a protein called "intrinsic factor," which is needed to absorb vitamin B12.  Having a surgery in which part of the stomach or small intestine is removed.  Taking certain medicines that make it hard for the body to absorb vitamin B12. These medicines include:  Heartburn medicine (antacids and proton pump inhibitors).  A certain antibiotic medicine called neomycin, which fights infection.  Some medicines used to treat diabetes, tuberculosis, gout, and high cholesterol. RISK FACTORS Risk factors are things that make you more likely to develop a vitamin B12  deficiency. They include:  Being older than 45.  Being a vegetarian.  Being pregnant and a vegetarian or having a poor diet.  Taking certain drugs.  Being an alcoholic. SYMPTOMS You may have a vitamin B12 deficiency with no symptoms. However, a vitamin B12 deficiency can cause health problems like anemia and nerve damage. These health problems can lead to many possible symptoms, including:  Weakness.  Fatigue.  Loss of appetite.  Weight loss.  Numbness or tingling in your hands and feet.  Redness and burning of the tongue.  Confusion or memory problems.  Depression.  Dizziness.  Sensory problems, such as loss of taste, color blindness, and ringing in the ears.  Diarrhea or constipation.  Trouble walking. DIAGNOSIS Various types of tests can be given to help find the cause of your vitamin B12 deficiency. These tests include:  A complete blood count (CBC). This test gives your caregiver an overall picture of what makes up your blood.  A blood test to measure your B12 level.  A blood test to measure intrinsic factor.  An endoscopy. This procedure uses a thin tube with a camera on the end to look into your stomach or intestines. TREATMENT Treatment for vitamin B12 deficiency depends on what is causing it. Common options include:  Changing your eating and drinking habits, such as:  Eating more foods that contain vitamin B12.  Not drinking as much alcohol or any alcohol.  Taking vitamin B12 supplements. Your caregiver will tell you what dose is best for you.  Getting vitamin B12 injections. Some people get these a  few times a week. Others get them once a month. HOME CARE INSTRUCTIONS  Take all supplements as directed by your caregiver. Follow the directions carefully.  Get any injections your caregiver prescribes. Do not miss your appointments.  Eat lots of healthy foods that contain vitamin B12. Ask your caregiver if you should work with a nutritionist.  Good things to include in your diet are:  Meat.  Poultry.  Fish.  Eggs.  Fortified cereal and dairy products. This means vitamin B12 has been added to the food. Check the label on the package to be sure.  Do not abuse alcohol.  Keep all follow-up appointments. Your caregiver will need to perform blood tests to make sure your vitamin B12 deficiency is going away. SEEK MEDICAL CARE IF:  You have any questions about your treatment.  Your symptoms come back. MAKE SURE YOU:  Understand these instructions.  Will watch your condition.  Will get help right away if you are not doing well or get worse. Document Released: 05/14/2011 Document Reviewed: 05/14/2011 [facility] Patient Information 2015 [place]. This information is not intended to replace advice given to you by your health care provider. Make sure you discuss any questions you have with your health care provider.

## 2014-11-30 NOTE — Progress Notes (Signed)
Subjective:    [name], male    DOB: [birth year], 54 y.o.   MRN: [id number]  Chief Complaint  Patient presents with  . Numbness    x2 weeks, has had tingling in fingers and toes, also his tongue, at one point hands and feet were completely numb, the tingling has pretty much been non stop    HPI:  Austin Meadows is a 54 y.o. male who  has a past medical history of Chicken pox; Jaundice of newborn; Hyperlipidemia; Rheumatic fever; UTI (lower urinary tract infection); Hemorrhoid; and Diabetes mellitus without complication. and presents today for a follow up office visit.  Numbness - This is a new problem. Associated symptom of numbness and tingling located in his fingers, toes and tongue has been going on for about 2 weeks. The severity of the symptoms peaked with his feet being completely numb at one point. Reports the symptoms are constant. Modifying factors include trying to eat well and control his diabetes. Reports that his blood sugars are less than 100 on average. Does eat chicken but does not eat a lot of beef.    Lab Results  Component Value Date   HGBA1C 6.6* 06/09/2014     Allergies  Allergen Reactions  . Penicillins Other (See Comments)    Pt can't remember reaction.     Current Outpatient Prescriptions on File Prior to Visit  Medication Sig Dispense Refill  . glucose blood (ONETOUCH VERIO) test strip Use as instructed 100 each 12  . metFORMIN (GLUCOPHAGE) 500 MG tablet TAKE ONE TABLET BY MOUTH TWICE DAILY WITH MEALS 60 tablet 0  . Na Sulfate-K Sulfate-Mg Sulf SOLN Suprep= (no substitutions)-TAKE AS DIRECTED. 354 mL 0   No current facility-administered medications on file prior to visit.     Past Surgical History  Procedure Laterality Date  . Colonoscopy  ages [phone]    done at [facility] and [facility](pt unable to get records)     Review of Systems  Constitutional: Negative for fever and chills.  Eyes:       Negative for changes in  vision.  Respiratory: Negative for chest tightness and shortness of breath.   Cardiovascular: Negative for chest pain, palpitations and leg swelling.  Endocrine: Negative for polydipsia, polyphagia and polyuria.  Neurological: Positive for numbness. Negative for weakness.      Objective:    BP 128/94 mmHg  Pulse 85  Temp(Src) 98.7 F (37.1 C) (Oral)  Resp 18  Ht 5\' 11"  (1.803 m)  Wt 222 lb 9.6 oz (100.971 kg)  BMI 31.06 kg/m2  SpO2 98% Nursing note and vital signs reviewed.  Physical Exam  Constitutional: He is oriented to person, place, and time. He appears well-developed and well-nourished. No distress.  Cardiovascular: Normal rate, regular rhythm, normal heart sounds and intact distal pulses.   Pulmonary/Chest: Effort normal and breath sounds normal.  Neurological: He is alert and oriented to person, place, and time. He has normal reflexes. No cranial nerve deficit.  Upper and lower muscle extremity strength and sensation is normal with some numbess as indicated by patient.   Skin: Skin is warm and dry.  Psychiatric: He has a normal mood and affect. His behavior is normal. Judgment and thought content normal.       Assessment & Plan:   Problem List Items Addressed This Visit      Other   Numbness and tingling - Primary    Numbness and tingling in multiple extremities and his tongue  consistent with possible B12 deficiency, however cannot rule out other metabolic causes including diabetes, thyroid or iron deficiency. Obtain TSH, methylmalonic acid, IBC panel, A1c, Ferritin, CBC and B12/Folate. Continue to monitor blood sugars and current dose of metformin. Follow up pending lab work.       Relevant Orders   B12 and Folate Panel   Methylmalonic acid, serum   IBC panel   TSH   Hemoglobin A1c   CBC   Ferritin

## 2014-11-30 NOTE — Progress Notes (Signed)
Pre visit review using our clinic review tool, if applicable. No additional management support is needed unless otherwise documented below in the visit note. 

## 2014-11-30 NOTE — Assessment & Plan Note (Signed)
Numbness and tingling in multiple extremities and his tongue consistent with possible B12 deficiency, however cannot rule out other metabolic causes including diabetes, thyroid or iron deficiency. Obtain TSH, methylmalonic acid, IBC panel, A1c, Ferritin, CBC and B12/Folate. Continue to monitor blood sugars and current dose of metformin. Follow up pending lab work.

## 2014-12-01 ENCOUNTER — Telehealth: Payer: Self-pay | Admitting: Family

## 2014-12-01 NOTE — Telephone Encounter (Signed)
Pt aware of results is being scheduled for a nurse visit Friday for B12 injection. I told him the results of his A1c. He would like to know since it came down if he still should stay on metformin. Please advise

## 2014-12-01 NOTE — Telephone Encounter (Signed)
He may try a 3 month trial without the metformin and then we can recheck his A1c at that time to determine current need.

## 2014-12-01 NOTE — Telephone Encounter (Signed)
Patient called requesting his B12 lab work because he wants to come in for a B12 injection

## 2014-12-02 NOTE — Telephone Encounter (Signed)
Pt aware.

## 2014-12-03 ENCOUNTER — Ambulatory Visit (INDEPENDENT_AMBULATORY_CARE_PROVIDER_SITE_OTHER): Payer: 59

## 2014-12-03 DIAGNOSIS — E538 Deficiency of other specified B group vitamins: Secondary | ICD-10-CM

## 2014-12-03 DIAGNOSIS — Z23 Encounter for immunization: Secondary | ICD-10-CM | POA: Diagnosis not present

## 2014-12-03 LAB — METHYLMALONIC ACID, SERUM: Methylmalonic Acid, Quant: 176 nmol/L (ref 87–318)

## 2014-12-03 MED ORDER — CYANOCOBALAMIN 1000 MCG/ML IJ SOLN
1000.0000 ug | Freq: Once | INTRAMUSCULAR | Status: AC
  Administered 2014-12-03: 1000 ug via INTRAMUSCULAR

## 2014-12-13 ENCOUNTER — Telehealth: Payer: Self-pay | Admitting: Family

## 2014-12-13 DIAGNOSIS — R200 Anesthesia of skin: Secondary | ICD-10-CM

## 2014-12-13 DIAGNOSIS — R202 Paresthesia of skin: Principal | ICD-10-CM

## 2014-12-13 NOTE — Telephone Encounter (Signed)
I have placed a referral to neurology for his symptoms.We can try one additional injection to see if that helps.

## 2014-12-13 NOTE — Telephone Encounter (Signed)
Pt is still feeling tingling in hands and feet and he stated that you told to come back in for another b12 shot if the tingling still persists.  Just wanted to let you know that I scheduled a nurse visit for this injection tomorrow afternoon.

## 2014-12-14 ENCOUNTER — Ambulatory Visit (INDEPENDENT_AMBULATORY_CARE_PROVIDER_SITE_OTHER): Payer: 59

## 2014-12-14 DIAGNOSIS — E538 Deficiency of other specified B group vitamins: Secondary | ICD-10-CM | POA: Diagnosis not present

## 2014-12-14 MED ORDER — CYANOCOBALAMIN 1000 MCG/ML IJ SOLN
1000.0000 ug | Freq: Once | INTRAMUSCULAR | Status: AC
  Administered 2014-12-14: 1000 ug via INTRAMUSCULAR

## 2014-12-14 NOTE — Telephone Encounter (Signed)
Pt aware.

## 2014-12-15 ENCOUNTER — Telehealth: Payer: Self-pay | Admitting: Family

## 2014-12-15 DIAGNOSIS — E119 Type 2 diabetes mellitus without complications: Secondary | ICD-10-CM

## 2014-12-15 NOTE — Telephone Encounter (Signed)
Referral sent 

## 2014-12-15 NOTE — Telephone Encounter (Signed)
Pt has sent an appointment request through mychart for an Ophthalmology Exam. Can you put a referral in for him

## 2014-12-23 ENCOUNTER — Ambulatory Visit (INDEPENDENT_AMBULATORY_CARE_PROVIDER_SITE_OTHER): Payer: 59

## 2014-12-23 DIAGNOSIS — E538 Deficiency of other specified B group vitamins: Secondary | ICD-10-CM | POA: Diagnosis not present

## 2014-12-23 MED ORDER — CYANOCOBALAMIN 1000 MCG/ML IJ SOLN
1000.0000 ug | Freq: Once | INTRAMUSCULAR | Status: AC
  Administered 2014-12-23: 1000 ug via INTRAMUSCULAR

## 2014-12-31 ENCOUNTER — Ambulatory Visit (INDEPENDENT_AMBULATORY_CARE_PROVIDER_SITE_OTHER): Payer: 59

## 2014-12-31 DIAGNOSIS — E538 Deficiency of other specified B group vitamins: Secondary | ICD-10-CM

## 2014-12-31 MED ORDER — CYANOCOBALAMIN 1000 MCG/ML IJ SOLN
1000.0000 ug | Freq: Once | INTRAMUSCULAR | Status: AC
  Administered 2014-12-31: 1000 ug via INTRAMUSCULAR

## 2015-01-11 LAB — HM DIABETES EYE EXAM

## 2015-01-17 ENCOUNTER — Encounter: Payer: Self-pay | Admitting: Neurology

## 2015-01-17 ENCOUNTER — Ambulatory Visit (INDEPENDENT_AMBULATORY_CARE_PROVIDER_SITE_OTHER): Payer: 59 | Admitting: Neurology

## 2015-01-17 ENCOUNTER — Other Ambulatory Visit (INDEPENDENT_AMBULATORY_CARE_PROVIDER_SITE_OTHER): Payer: 59

## 2015-01-17 VITALS — BP 150/80 | HR 91 | Ht 71.0 in | Wt 221.1 lb

## 2015-01-17 DIAGNOSIS — E538 Deficiency of other specified B group vitamins: Secondary | ICD-10-CM

## 2015-01-17 DIAGNOSIS — M792 Neuralgia and neuritis, unspecified: Secondary | ICD-10-CM

## 2015-01-17 DIAGNOSIS — G609 Hereditary and idiopathic neuropathy, unspecified: Secondary | ICD-10-CM

## 2015-01-17 DIAGNOSIS — R202 Paresthesia of skin: Secondary | ICD-10-CM | POA: Diagnosis not present

## 2015-01-17 LAB — SEDIMENTATION RATE: SED RATE: 9 mm/h (ref 0–22)

## 2015-01-17 LAB — C-REACTIVE PROTEIN: CRP: 0.4 mg/dL — AB (ref 0.5–20.0)

## 2015-01-17 MED ORDER — GABAPENTIN 300 MG PO CAPS: 300.0000 mg | ORAL_CAPSULE | Freq: Three times a day (TID) | ORAL | Status: DC

## 2015-01-17 MED ORDER — CYANOCOBALAMIN 1000 MCG/ML IJ SOLN
1000.0000 ug | Freq: Once | INTRAMUSCULAR | Status: AC
  Administered 2015-01-17: 1000 ug via INTRAMUSCULAR

## 2015-01-17 NOTE — Patient Instructions (Addendum)
1.  Check blood work 2.  NCS/EMG of the right arm and leg 3.  Continue vitamin B12 injections 4.  Start Gabapentin instructions:  Day 1-3:  Morning:            Noon:   Bedtime: 300 mg   Day 4-6:  Morning: 300 mg   Noon:   Bedtime: 300 mg   Day 7-9:  Morning: 300 mg   Noon: 300 mg  Bedtime: 300 mg 5.  Return to clinic in 3 months, or sooner as needed

## 2015-01-17 NOTE — Progress Notes (Signed)
[facility] Clinic Note - Initial Visit   Date: 01/17/2015  Vasili Fok MRN: [id number] DOB: [birth year]   Dear Mauricio Po, NP:  Thank you for your kind referral of Roderic Palau for consultation of generalized paresthesias. Although his history is well known to you, please allow [name] to reiterate it for the purpose of our medical record. The patient was accompanied to the clinic by self.   History of Present Illness: Austin Meadows is a 54 y.o. right-handed African American male with diet-controlled diabetes mellitus presenting for evaluation of whole-body numbness/tingling.    Starting around September 2016, he developed gradual onset of tingling sensation involving fingers, hands, nose, tongue, lateral knees, and feet.  He had associated numbness of the feet.  Symptoms were constant at onset, worse on the right. Burning sensation is worse with pressure, such as walking or using a pen.  He has since received vitamin B12 supplementation which has improved his paresthesias by 80%. He endorses significantly reducing is meat intake over the past few years.  Currently, he only has tingling involving the toes and fingertips.  He no longer has facial paresthesias. He has difficulty with fine motor movements such as trying to use nail clippers. Denies any weakness, falls, or imbalance.    Denies any back pain or bowel/bladder problems.    Out-side paper records, electronic medical record, and images have been reviewed where available and summarized as:  Lab Results  Component Value Date   VITAMINB12 325 11/30/2014   Lab Results  Component Value Date   HGBA1C 6.0 11/30/2014   Lab Results  Component Value Date   TSH 2.03 11/30/2014    Past Medical History  Diagnosis Date  . Chicken pox   . Jaundice of newborn   . Hyperlipidemia   . Rheumatic fever   . UTI (lower urinary tract infection)   . Hemorrhoid   . Diabetes mellitus without complication ([place])     type 2      Past Surgical History  Procedure Laterality Date  . Colonoscopy  ages [phone]    done at [facility](pt unable to get records)     Medications:  Outpatient Encounter Prescriptions as of 01/17/2015  Medication Sig  . glucose blood (ONETOUCH VERIO) test strip Use as instructed  . gabapentin (NEURONTIN) 300 MG capsule Take 1 capsule (300 mg total) by mouth 3 (three) times daily.  . [DISCONTINUED] metFORMIN (GLUCOPHAGE) 500 MG tablet TAKE ONE TABLET BY MOUTH TWICE DAILY WITH MEALS  . [DISCONTINUED] Na Sulfate-K Sulfate-Mg Sulf SOLN Suprep= (no substitutions)-TAKE AS DIRECTED.  . [EXPIRED] cyanocobalamin ((VITAMIN B-12)) injection 1,000 mcg    No facility-administered encounter medications on file as of 01/17/2015.     Allergies:  Allergies  Allergen Reactions  . Penicillins Other (See Comments)    Pt can't remember reaction.    Family History: Family History  Problem Relation Age of Onset  . Arthritis Mother   . Cancer Mother     bone cancer  . Colon cancer Father 75  . Prostate cancer Father   . Heart disease Father   . Hypertension Father   . Diabetes Father   . Colon cancer Brother 70    late 56's when dx  . Cancer Brother     another cancer METS to brain  . Colon cancer Brother 18  . Colon cancer Other 65    Bill's daughter  . Healthy Son     x 4  . Healthy  Daughter     x 1    Social History: Social History  Substance Use Topics  . Smoking status: Former Smoker    Quit date: 06/04/1994  . Smokeless tobacco: Never Used  . Alcohol Use: 0.0 oz/week    0 Standard drinks or equivalent per week     Comment: Occasionally (one drink per week).  Heavier drinking in the [employer] for ~ 8 years.    Social History   Social History Narrative   Currently has 5 children; wife has 3 children.  Lives with his wife; Fun: rides a motorcycle.   Denies any religious beliefs that would effect health care.    Lives in a 2 story home.     Works  as a Child psychotherapist.  Education: associate degree.    Review of Systems:  CONSTITUTIONAL: No fevers, chills, night sweats, or weight loss.   EYES: No visual changes or eye pain ENT: No hearing changes.  No history of nose bleeds.   RESPIRATORY: No cough, wheezing and shortness of breath.   CARDIOVASCULAR: Negative for chest pain, and palpitations.   GI: Negative for abdominal discomfort, blood in stools or black stools.  No recent change in bowel habits.   GU:  No history of incontinence.   MUSCLOSKELETAL: No history of joint pain or swelling.  No myalgias.   SKIN: Negative for lesions, rash, and itching.   HEMATOLOGY/ONCOLOGY: Negative for prolonged bleeding, bruising easily, and swollen nodes.  No history of cancer.   ENDOCRINE: Negative for cold or heat intolerance, polydipsia or goiter.   PSYCH:  No depression or anxiety symptoms.   NEURO: As Above.   Vital Signs:  BP 150/80 mmHg  Pulse 91  Ht 5\' 11"  (1.803 m)  Wt 221 lb 2 oz (100.302 kg)  BMI 30.85 kg/m2  SpO2 97%   General Medical Exam:   General:  Well appearing, comfortable.   Eyes/ENT: see cranial nerve examination.   Neck: No masses appreciated.  Full range of motion without tenderness.  No carotid bruits. Respiratory:  Clear to auscultation, good air entry bilaterally.   Cardiac:  Regular rate and rhythm, no murmur.   Extremities:  No deformities, edema, or skin discoloration.  Skin:  No rashes or lesions.  Neurological Exam: MENTAL STATUS including orientation to time, place, person, recent and remote memory, attention span and concentration, language, and fund of knowledge is normal.  Speech is not dysarthric.  CRANIAL NERVES: II:  No visual field defects.  Unremarkable fundi.   III-IV-VI: Pupils equal round and reactive to light.  Normal conjugate, extra-ocular eye movements in all directions of gaze.  No nystagmus.  No ptosis.   V:  Normal facial sensation.     VII:  Normal facial symmetry and  movements. VIII:  Normal hearing and vestibular function.   IX-X:  Normal palatal movement.   XI:  Normal shoulder shrug and head rotation.   XII:  Normal tongue strength and range of motion, no deviation or fasciculation.  MOTOR:  No atrophy, fasciculations or abnormal movements.  No pronator drift.  Tone is normal.    Right Upper Extremity:    Left Upper Extremity:    Deltoid  5/5   Deltoid  5/5   Biceps  5/5   Biceps  5/5   Triceps  5/5   Triceps  5/5   Wrist extensors  5/5   Wrist extensors  5/5   Wrist flexors  5/5   Wrist flexors  5/5  Finger extensors  5/5   Finger extensors  5/5   Finger flexors  5/5   Finger flexors  5/5   Dorsal interossei  5/5   Dorsal interossei  5/5   Abductor pollicis  5/5   Abductor pollicis  5/5   Tone (Ashworth scale)  0  Tone (Ashworth scale)  0   Right Lower Extremity:    Left Lower Extremity:    Hip flexors  5/5   Hip flexors  5/5   Hip extensors  5/5   Hip extensors  5/5   Knee flexors  5/5   Knee flexors  5/5   Knee extensors  5/5   Knee extensors  5/5   Dorsiflexors  5/5   Dorsiflexors  5/5   Plantarflexors  5/5   Plantarflexors  5/5   Toe extensors  5/5   Toe extensors  5/5   Toe flexors  5/5   Toe flexors  5/5   Tone (Ashworth scale)  0  Tone (Ashworth scale)  0   MSRs:  Right                                                                 Left brachioradialis 2+  brachioradialis 2+  biceps 1+  biceps 1+  triceps 1+  triceps 1+  patellar 1+  patellar 1+  ankle jerk 0+  ankle jerk 0  Hoffman no  Hoffman no  plantar response down  plantar response down   SENSORY:  Reduced vibration at the left great toe, otherwise sensation to all modalities is intact.  No sensory level. Romberg's sign absent.   COORDINATION/GAIT: Normal finger-to- nose-finger and heel-to-shin.  Intact rapid alternating movements bilaterally.  Able to rise from a chair without using arms.  Gait mildly antalgic appearing because of feet paresthesias, stable.  Tandem  and stressed gait intact.    IMPRESSION: Mr. Trott is a 54 year-old gentleman referred for evaluation of subacute onset of generalized paresthesias.  His symptoms started relatively abruptly in September and have improved by 80% with the initiation of vitamin B12 injections (level was 325), but he admits to rarely eating red meat.  He continues to have painful paresthesias involving the fingers tips, feet, and lateral knee.  His exam is notable for generalized hyporeflexia, except for brachioradialis which is normal, and left foot sensory deficit.  Being that paresthesias do not confirm to a length dependent pattern, NCS/EMG will be ordered to be sure a polyradiculoneuropathy is not missed.  Additionally, I will check for treatable causes of neuropathy.  In the meantime, it is reasonable to continue vitamin B12 supplementation.    PLAN/RECOMMENDATIONS:  1.  Check ESR, CRP, vitamin B1, vitamin B6, copper, zinc, SPEP with IFE 2.  NCS/EMG of the right arm and leg 3.  Continue vitamin B12 supplementation, will administer injection today as he missed his dose last week 4.  Start gabapentin $RemoveBeforeDE'300mg'YnarAvyOTRFmyDi$  at bedtime and titrate to $RemoveBe'300mg'fWjeHDRUa$  three times daily 5.  Return to clinic in 3 months.   The duration of this appointment visit was 40 minutes of face-to-face time with the patient.  Greater than 50% of this time was spent in counseling, explanation of diagnosis, planning of further management, and coordination of care.   Thank you for allowing  me to participate in patient's care.  If I can answer any additional questions, I would be pleased to do so.    Sincerely,    Marynell Bies K. Posey Pronto, DO

## 2015-01-19 LAB — SPEP & IFE WITH QIG
ALPHA-1-GLOBULIN: 0.3 g/dL (ref 0.2–0.3)
Albumin ELP: 4.5 g/dL (ref 3.8–4.8)
Alpha-2-Globulin: 0.6 g/dL (ref 0.5–0.9)
Beta 2: 0.4 g/dL (ref 0.2–0.5)
Beta Globulin: 0.5 g/dL (ref 0.4–0.6)
GAMMA GLOBULIN: 1.1 g/dL (ref 0.8–1.7)
IGM, SERUM: 118 mg/dL (ref 41–251)
IgA: 235 mg/dL (ref 68–379)
IgG (Immunoglobin G), Serum: 1180 mg/dL (ref 650–1600)
Total Protein, Serum Electrophoresis: 7.3 g/dL (ref 6.1–8.1)

## 2015-01-20 ENCOUNTER — Telehealth: Payer: Self-pay | Admitting: Neurology

## 2015-01-20 LAB — VITAMIN B1: VITAMIN B1 (THIAMINE): 19 nmol/L (ref 8–30)

## 2015-01-20 LAB — COPPER, SERUM: COPPER: 99 ug/dL (ref 70–175)

## 2015-01-20 LAB — VITAMIN B6: VITAMIN B6: 30.9 ng/mL — AB (ref 2.1–21.7)

## 2015-01-20 LAB — ZINC: ZINC: 89 ug/dL (ref 60–130)

## 2015-01-20 NOTE — Telephone Encounter (Signed)
Spoke with patient. Only 2 of 7 test results have been returned. No note seen from provider on the two results that are back. Informed pt we would call him once the other tests have been resulted. Pt verbalized understanding and denied questions.

## 2015-01-20 NOTE — Telephone Encounter (Signed)
PT called and wanted to know his blood work test results/Dawn  CB# [phone]

## 2015-02-04 ENCOUNTER — Telehealth: Payer: Self-pay | Admitting: Neurology

## 2015-02-04 ENCOUNTER — Ambulatory Visit (INDEPENDENT_AMBULATORY_CARE_PROVIDER_SITE_OTHER): Payer: 59 | Admitting: Neurology

## 2015-02-04 ENCOUNTER — Other Ambulatory Visit: Payer: Self-pay | Admitting: *Deleted

## 2015-02-04 DIAGNOSIS — E538 Deficiency of other specified B group vitamins: Secondary | ICD-10-CM | POA: Diagnosis not present

## 2015-02-04 DIAGNOSIS — G610 Guillain-Barre syndrome: Secondary | ICD-10-CM

## 2015-02-04 DIAGNOSIS — G609 Hereditary and idiopathic neuropathy, unspecified: Secondary | ICD-10-CM

## 2015-02-04 DIAGNOSIS — M792 Neuralgia and neuritis, unspecified: Secondary | ICD-10-CM

## 2015-02-04 DIAGNOSIS — R202 Paresthesia of skin: Secondary | ICD-10-CM

## 2015-02-04 NOTE — Telephone Encounter (Signed)
Results have the EMG was discussed with the patient which is most suggestive of polyradiculoneuropathy needs further investigation with CSF fluid analysis.   Austin Meadows, please order CSF testing: CSF cell count and diff, protein, glucose, routine cultures, fungal cultures, cryptococcus antigen, IgG index, oligoclonal bands, myelin basic protein, flow cytology, ACE.  [name] K. Posey Pronto, DO

## 2015-02-04 NOTE — Procedures (Signed)
[facility]  [address]  [place], [place] [address] Tel: [phone] Fax:  [phone] Test Date:  02/04/2015  Patient: Austin Meadows DOB: [birth year] Physician: Narda Amber  Sex: Male Height: 6' " Ref Phys: Narda Amber  ID#: [id number] Temp: 32.4C Technician: Jerilynn Mages. Dean   Patient Complaints: This is a 54 year old gentleman presenting for evaluation of 28-month history of whole body numbness and tingling.  NCV & EMG Findings: Extensive electrodiagnostic testing of the right upper and lower extremities shows: 1. Nearly all sensory responses including the median, ulnar, sural, and superficial peroneal nerves are absent. The right median sensory response is prolonged latency (2.8 ms) and reduced amplitude (3.9 V).  2. Nearly all the motor responses including the median, ulnar, and peroneal (EDB) shows reduced amplitude (2.0 mV, 6.4 mV, 1.5 mV) and conduction velocity slowing (range 31-45 m/s). Additionally, the right median and ulnar motor responses show significantly prolonged latency (10.5 ms, 4.4 ms). The peroneal motor response recording at the tibialis anterior is within normal limits. 3. Tibial and ulnar F wave studies are markedly prolonged. Tibial H reflex studies within normal limits. 4. Chronic motor axon loss changes are patchy in distribution and seen affecting the abductor pollicis brevis, flexor digitorum longus, and anterior tibialis muscles.  There is no evidence of active denervation affecting any of the tested muscles.  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   _____________________________ Narda Amber, D.O.    Nerve Conduction Studies Anti Sensory Summary Table   Stim Site NR Peak (ms) Norm Peak (ms) P-T Amp (V) Norm P-T Amp  Right Median Anti Sensory (2nd Digit)  32.4C  Wrist NR  <3.6  >15  Right Radial Anti Sensory (Base 1st  Digit)  32.4C  Wrist    2.8 <2.7 3.9 >14  Site 2    2.8  3.6   Right Sup Peroneal Anti Sensory (Ant Lat Mall)  32.4C  12 cm NR  <4.6  >4  Right Sural Anti Sensory (Lat Mall)  32.4C  Calf NR  <4.6  >4  Right Ulnar Anti Sensory (5th Digit)  32.4C  Wrist NR  <3.1  >10   Motor Summary Table   Stim Site NR Onset (ms) Norm Onset (ms) O-P Amp (mV) Norm O-P Amp Site1 Site2 Delta-0 (ms) Dist (cm) Vel (m/s) Norm Vel (m/s)  Right Median Motor (Abd Poll Brev)  32.4C  Wrist    10.5 <4.0 2.0 >6 Elbow Wrist 6.6 30.0 45 >50  Elbow    17.1  2.1         Right Peroneal Motor (Ext Dig Brev)  32.4C  Ankle    5.8 <6.0 1.5 >2.5 B Fib Ankle 10.8 38.0 35 >40  B Fib    16.6  1.1  Poplt B Fib 3.2 10.0 31 >40  Poplt    19.8  1.0         Right Peroneal TA Motor (Tib Ant)  32.4C  Fib Head    2.7 <4.5 4.1 >3 Poplit Fib Head 1.8 10.0 56 >40  Poplit    4.5  3.7         Right Tibial Motor (Abd Hall Brev)  32.4C  Ankle    4.2 <6.0 4.5 >4 Knee Ankle 12.0 43.0 36 >40  Knee    16.2  2.6         Right Ulnar Motor (Abd Dig Minimi)  32.4C  Wrist  4.4 <3.1 6.4 >7 B Elbow Wrist 4.5 27.0 60 >50  B Elbow    8.9  6.0  A Elbow B Elbow 2.2 10.0 45 >50  A Elbow    11.1  5.9          F Wave Studies   NR F-Lat (ms) Lat Norm (ms) L-R F-Lat (ms)  Right Tibial (Mrkrs) (Abd Hallucis)  32.4C     65.76 <55   Right Ulnar (Mrkrs) (Abd Dig Min)  32.4C     36.76 <33    H Reflex Studies   NR H-Lat (ms) Lat Norm (ms) L-R H-Lat (ms)  Right Tibial (Gastroc)  32.4C     34.83 <35    EMG   Side Muscle Ins Act Fibs Psw Fasc Number Recrt Dur Dur. Amp Amp. Poly Poly. Comment  Right 1stDorInt Nml Nml Nml Nml Nml Nml Nml Nml Nml Nml Nml Nml N/A  Right AntTibialis Nml Nml Nml Nml 1- Rapid Few 1+ Few 1+ Nml Nml N/A  Right Gastroc Nml Nml Nml Nml Nml Nml Nml Nml Nml Nml Nml Nml N/A  Right Flex Dig Long Nml Nml Nml Nml 1- Rapid Some 1+ Some 1+ Nml Nml N/A  Right RectFemoris Nml Nml Nml Nml Nml Nml Nml Nml Nml Nml Nml Nml N/A    Right BicepsFemS Nml Nml Nml Nml Nml Nml Nml Nml Nml Nml Nml Nml N/A  Right GluteusMed Nml Nml Nml Nml Nml Nml Nml Nml Nml Nml Nml Nml N/A  Right Lumbo Parasp Low Nml Nml Nml Nml Nml Nml Nml Nml Nml Nml Nml Nml N/A  Right Abd Poll Brev Nml Nml Nml Nml 2- Rapid Some 1+ Some 1+ Nml Nml N/A  Right Ext Indicis Nml Nml Nml Nml Nml Nml Nml Nml Nml Nml Nml Nml N/A  Right PronatorTeres Nml Nml Nml Nml Nml Nml Nml Nml Nml Nml Nml Nml N/A  Right Biceps Nml Nml Nml Nml Nml Nml Nml Nml Nml Nml Nml Nml N/A  Right Triceps Nml Nml Nml Nml Nml Nml Nml Nml Nml Nml Nml Nml N/A  Right Deltoid Nml Nml Nml Nml Nml Nml Nml Nml Nml Nml Nml Nml N/A  Right Cervical Parasp Low 1+ Nml Nml Nml Nml Nml Nml Nml Nml Nml Nml Nml N/A      Waveforms:

## 2015-02-04 NOTE — Telephone Encounter (Signed)
Order placed and labs faxed to Hector at [facility].

## 2015-02-10 ENCOUNTER — Ambulatory Visit (INDEPENDENT_AMBULATORY_CARE_PROVIDER_SITE_OTHER): Payer: 59

## 2015-02-10 DIAGNOSIS — E538 Deficiency of other specified B group vitamins: Secondary | ICD-10-CM

## 2015-02-10 MED ORDER — CYANOCOBALAMIN 1000 MCG/ML IJ SOLN
1000.0000 ug | Freq: Once | INTRAMUSCULAR | Status: AC
  Administered 2015-02-10: 1000 ug via INTRAMUSCULAR

## 2015-02-15 ENCOUNTER — Ambulatory Visit
Admission: RE | Admit: 2015-02-15 | Discharge: 2015-02-15 | Disposition: A | Discharge status: Home / Self Care | Payer: 59 | Source: Ambulatory Visit | Attending: Neurology | Admitting: Neurology

## 2015-02-15 ENCOUNTER — Other Ambulatory Visit: Payer: Self-pay | Admitting: Neurology

## 2015-02-15 ENCOUNTER — Other Ambulatory Visit (HOSPITAL_COMMUNITY)
Admission: RE | Admit: 2015-02-15 | Discharge: 2015-02-15 | Disposition: A | Discharge status: Home / Self Care | Payer: 59 | Source: Ambulatory Visit | Attending: Neurology | Admitting: Neurology

## 2015-02-15 DIAGNOSIS — G610 Guillain-Barre syndrome: Secondary | ICD-10-CM | POA: Insufficient documentation

## 2015-02-15 DIAGNOSIS — R202 Paresthesia of skin: Secondary | ICD-10-CM

## 2015-02-15 DIAGNOSIS — G609 Hereditary and idiopathic neuropathy, unspecified: Secondary | ICD-10-CM | POA: Insufficient documentation

## 2015-02-15 DIAGNOSIS — G629 Polyneuropathy, unspecified: Secondary | ICD-10-CM | POA: Insufficient documentation

## 2015-02-15 DIAGNOSIS — M792 Neuralgia and neuritis, unspecified: Secondary | ICD-10-CM

## 2015-02-15 LAB — CSF CELL COUNT WITH DIFFERENTIAL
RBC Count, CSF: 0 cu mm
TUBE #: 3
WBC, CSF: 1 cu mm (ref 0–5)

## 2015-02-15 LAB — GLUCOSE, CSF: GLUCOSE CSF: 63 mg/dL (ref 43–76)

## 2015-02-15 LAB — PROTEIN, CSF: TOTAL PROTEIN, CSF: 121 mg/dL — AB (ref 15–45)

## 2015-02-15 NOTE — Progress Notes (Addendum)
1 SST tube drawn from left AC. Site is unremarkable and pt tolerated procedure well.  JKL RN  Discharge instructions explained to pt by [facility] RN

## 2015-02-15 NOTE — Discharge Instructions (Signed)

## 2015-02-18 LAB — CSF CULTURE: ORGANISM ID, BACTERIA: NO GROWTH

## 2015-02-18 LAB — CNS IGG SYNTHESIS RATE, CSF+BLOOD
ALBUMIN CSF: 80.2 mg/dL — AB (ref 8.0–42.0)
Albumin, Serum(Neph): 4.5 g/dL (ref 3.5–4.9)
IGG INDEX, CSF: 0.47 (ref <0.66)
IGG, CSF: 9.3 mg/dL — AB (ref 0.8–7.7)
IgG, Serum: 1110 mg/dL (ref 694–1618)
MS CNS IGG SYNTHESIS RATE: -0.7 mg/(24.h) (ref <=3.3)

## 2015-02-18 LAB — CRYPTOCOCCAL AG, LTX SCR RFLX TITER: CRYPTOCOCCAL AG SCREEN: NOT DETECTED

## 2015-02-18 LAB — CSF CULTURE W GRAM STAIN: Gram Stain: NONE SEEN

## 2015-02-18 LAB — ANGIOTENSIN CONVERTING ENZYME, CSF: ACE, CSF: 6 U/L (ref <=15)

## 2015-02-20 LAB — OLIGOCLONAL BANDS, CSF + SERM

## 2015-02-20 LAB — MYELIN BASIC PROTEIN, CSF

## 2015-02-24 ENCOUNTER — Ambulatory Visit (INDEPENDENT_AMBULATORY_CARE_PROVIDER_SITE_OTHER): Payer: 59

## 2015-02-24 DIAGNOSIS — E538 Deficiency of other specified B group vitamins: Secondary | ICD-10-CM | POA: Diagnosis not present

## 2015-02-24 MED ORDER — CYANOCOBALAMIN 1000 MCG/ML IJ SOLN
1000.0000 ug | Freq: Once | INTRAMUSCULAR | Status: AC
  Administered 2015-02-24: 1000 ug via INTRAMUSCULAR

## 2015-02-25 ENCOUNTER — Telehealth: Payer: Self-pay | Admitting: Neurology

## 2015-02-25 NOTE — Telephone Encounter (Signed)
I attempted to contact patient via phone today regarding the results of CSF testing, however there was no answer so a message was left for the patient to return my call.   Marvel Sapp K. Posey Pronto, DO

## 2015-03-01 ENCOUNTER — Telehealth: Payer: Self-pay | Admitting: Neurology

## 2015-03-01 ENCOUNTER — Other Ambulatory Visit: Payer: Self-pay | Admitting: *Deleted

## 2015-03-01 DIAGNOSIS — R202 Paresthesia of skin: Secondary | ICD-10-CM

## 2015-03-01 DIAGNOSIS — G610 Guillain-Barre syndrome: Secondary | ICD-10-CM

## 2015-03-01 DIAGNOSIS — M792 Neuralgia and neuritis, unspecified: Secondary | ICD-10-CM

## 2015-03-01 DIAGNOSIS — G609 Hereditary and idiopathic neuropathy, unspecified: Secondary | ICD-10-CM

## 2015-03-01 MED ORDER — METHYLPREDNISOLONE SODIUM SUCC 1000 MG IJ SOLR: 1000.0000 mg | Freq: Every day | INTRAMUSCULAR | Status: AC

## 2015-03-01 NOTE — Telephone Encounter (Signed)
PT called and wanted to know the test results from his spinal tap/Dawn CB# [phone]

## 2015-03-01 NOTE — Telephone Encounter (Signed)
Patient is scheduled for Jan. 4 to start infusions.  Patient notified of appointment time and date.  Instructed him to call me or short stay for any questions or concerns.

## 2015-03-01 NOTE — Telephone Encounter (Signed)
Please advise 

## 2015-03-01 NOTE — Telephone Encounter (Signed)
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  Diagnosis was diagnosis was discussed and recommendations made to start steroid infusions.  Risks and benefits discussed.  Proceed with solumedrol 1g IV x 5 days.    I will set him up for return visit in the next 4-6 weeks to determine whether he needs maintenance therapy.  Donika K. Posey Pronto, DO

## 2015-03-08 ENCOUNTER — Other Ambulatory Visit: Payer: Self-pay | Admitting: *Deleted

## 2015-03-08 ENCOUNTER — Encounter (HOSPITAL_COMMUNITY)
Admission: RE | Admit: 2015-03-08 | Discharge: 2015-03-08 | Disposition: A | Discharge status: Home / Self Care | Payer: 59 | Source: Ambulatory Visit | Attending: Neurology | Admitting: Neurology

## 2015-03-08 ENCOUNTER — Telehealth: Payer: Self-pay | Admitting: *Deleted

## 2015-03-08 DIAGNOSIS — G610 Guillain-Barre syndrome: Secondary | ICD-10-CM

## 2015-03-08 DIAGNOSIS — M792 Neuralgia and neuritis, unspecified: Secondary | ICD-10-CM

## 2015-03-08 DIAGNOSIS — R202 Paresthesia of skin: Secondary | ICD-10-CM

## 2015-03-08 DIAGNOSIS — G609 Hereditary and idiopathic neuropathy, unspecified: Secondary | ICD-10-CM | POA: Diagnosis not present

## 2015-03-08 MED ORDER — METHYLPREDNISOLONE SODIUM SUCC 1000 MG IJ SOLR: 1000.0000 mg | Freq: Once | INTRAMUSCULAR | Status: DC

## 2015-03-08 MED ORDER — METHYLPREDNISOLONE SODIUM SUCC 1000 MG IJ SOLR
1000.0000 mg | Freq: Once | INTRAMUSCULAR | Status: AC
  Administered 2015-03-08: 1000 mg via INTRAVENOUS
  Filled 2015-03-08: qty 8

## 2015-03-08 MED ORDER — METHYLPREDNISOLONE SODIUM SUCC 1000 MG IJ SOLR: 1000.0000 mg | Freq: Four times a day (QID) | INTRAMUSCULAR | Status: DC

## 2015-03-08 NOTE — Telephone Encounter (Signed)
Kylie from short stay PA office called for a PA on patient's solumedrol.  I called [employer] and they said that they do not need a PA.  Left message on short stay voicemail informing them.

## 2015-03-09 ENCOUNTER — Encounter (HOSPITAL_COMMUNITY)
Admission: RE | Admit: 2015-03-09 | Discharge: 2015-03-09 | Disposition: A | Discharge status: Home / Self Care | Payer: 59 | Source: Ambulatory Visit | Attending: Neurology | Admitting: Neurology

## 2015-03-09 DIAGNOSIS — G609 Hereditary and idiopathic neuropathy, unspecified: Secondary | ICD-10-CM | POA: Diagnosis not present

## 2015-03-09 MED ORDER — SODIUM CHLORIDE 0.9 % IV SOLN
1000.0000 mg | INTRAVENOUS | Status: DC
  Administered 2015-03-09: 1000 mg via INTRAVENOUS
  Filled 2015-03-09: qty 8

## 2015-03-10 ENCOUNTER — Encounter (HOSPITAL_COMMUNITY)
Admission: RE | Admit: 2015-03-10 | Discharge: 2015-03-10 | Disposition: A | Discharge status: Home / Self Care | Payer: 59 | Source: Ambulatory Visit | Attending: Neurology | Admitting: Neurology

## 2015-03-10 DIAGNOSIS — G609 Hereditary and idiopathic neuropathy, unspecified: Secondary | ICD-10-CM | POA: Diagnosis not present

## 2015-03-10 MED ORDER — SODIUM CHLORIDE 0.9 % IV SOLN
1000.0000 mg | INTRAVENOUS | Status: DC
  Administered 2015-03-10: 1000 mg via INTRAVENOUS
  Filled 2015-03-10: qty 8

## 2015-03-11 ENCOUNTER — Ambulatory Visit (HOSPITAL_COMMUNITY)
Admission: RE | Admit: 2015-03-11 | Discharge: 2015-03-11 | Disposition: A | Discharge status: Home / Self Care | Payer: 59 | Source: Ambulatory Visit | Attending: Neurology | Admitting: Neurology

## 2015-03-11 DIAGNOSIS — G610 Guillain-Barre syndrome: Secondary | ICD-10-CM | POA: Diagnosis not present

## 2015-03-11 DIAGNOSIS — G609 Hereditary and idiopathic neuropathy, unspecified: Secondary | ICD-10-CM | POA: Diagnosis present

## 2015-03-11 MED ORDER — METHYLPREDNISOLONE SODIUM SUCC 1000 MG IJ SOLR
1000.0000 mg | INTRAMUSCULAR | Status: DC
  Administered 2015-03-11: 1000 mg via INTRAVENOUS
  Filled 2015-03-11: qty 8

## 2015-03-14 ENCOUNTER — Ambulatory Visit (HOSPITAL_COMMUNITY)
Admission: RE | Admit: 2015-03-14 | Discharge: 2015-03-14 | Disposition: A | Discharge status: Home / Self Care | Payer: 59 | Source: Ambulatory Visit | Attending: Neurology | Admitting: Neurology

## 2015-03-14 DIAGNOSIS — G609 Hereditary and idiopathic neuropathy, unspecified: Secondary | ICD-10-CM | POA: Diagnosis not present

## 2015-03-14 DIAGNOSIS — G610 Guillain-Barre syndrome: Secondary | ICD-10-CM | POA: Diagnosis not present

## 2015-03-14 LAB — FUNGUS CULTURE W SMEAR: Smear Result: NONE SEEN

## 2015-03-14 MED ORDER — SODIUM CHLORIDE 0.9 % IV SOLN
1000.0000 mg | INTRAVENOUS | Status: DC
  Administered 2015-03-14: 1000 mg via INTRAVENOUS
  Filled 2015-03-14: qty 8

## 2015-04-11 ENCOUNTER — Encounter: Payer: Self-pay | Admitting: Neurology

## 2015-04-11 ENCOUNTER — Ambulatory Visit (INDEPENDENT_AMBULATORY_CARE_PROVIDER_SITE_OTHER): Payer: 59 | Admitting: Neurology

## 2015-04-11 VITALS — BP 150/84 | HR 77 | Wt 225.1 lb

## 2015-04-11 DIAGNOSIS — G6181 Chronic inflammatory demyelinating polyneuritis: Secondary | ICD-10-CM

## 2015-04-11 MED ORDER — METHYLPREDNISOLONE SODIUM SUCC 1000 MG IJ SOLR: 1000.0000 mg | Freq: Once | INTRAMUSCULAR | Status: DC

## 2015-04-11 NOTE — Progress Notes (Signed)
Follow-up Visit   Date: 04/11/2015   Austin Meadows MRN: [id number] DOB: 12-23-1960   Interim History: Austin Meadows is a 55 y.o. 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  The patient was accompanied to the clinic by self.  History of present illness: Starting around September 2016, he developed gradual onset of tingling sensation involving fingers, hands, nose, tongue, lateral knees, and feet. He had associated numbness of the feet. Symptoms were constant at onset, worse on the right. Burning sensation is worse with pressure, such as walking or using a pen. He has since received vitamin B12 supplementation which has improved his paresthesias by 80%. He endorses significantly reducing is meat intake over the past few years. Currently, he only has tingling involving the toes and fingertips. He no longer has facial paresthesias. He has difficulty with fine motor movements such as trying to use nail clippers. Denies any weakness, falls, or imbalance.  UPDATE 04/10/2014:  Patient underwent NCS/EMG and CSF testing which was most consistent with CIDP.  He completed 5-day course of IV methylprednisone and reports that "everything's better".   He has very tiny amount of tingling at the tips of the fingers, none in the feet.  He takes gabapentin 374m at bedtime and has noticed mild swelling of the base of his right foot. Denies any weakness or imbalance.  He has no new complaints.    Medications:  Current Outpatient Prescriptions on File Prior to Visit  Medication Sig Dispense Refill  . gabapentin (NEURONTIN) 300 MG capsule Take 1 capsule (300 mg total) by mouth 3 (three) times daily. 90 capsule 5   Current Facility-Administered Medications on File Prior to Visit  Medication Dose Route Frequency Provider Last Rate Last Dose  . methylPREDNISolone sodium succinate (SOLU-MEDROL) injection 1,000 mg  1,000 mg Intravenous Q6H  Donika K Patel, DO      . methylPREDNISolone sodium succinate (SOLU-MEDROL) injection 1,000 mg  1,000 mg Intravenous Once DAlda Berthold DO        Allergies:  Allergies  Allergen Reactions  . Influenza Vaccines Other (See Comments)    Guillain barre syndrome  . Penicillins Other (See Comments)    Pt can't remember reaction.    Review of Systems:  CONSTITUTIONAL: No fevers, chills, night sweats, or weight loss.  EYES: No visual changes or eye pain ENT: No hearing changes.  No history of nose bleeds.   RESPIRATORY: No cough, wheezing and shortness of breath.   CARDIOVASCULAR: Negative for chest pain, and palpitations.   GI: Negative for abdominal discomfort, blood in stools or black stools.  No recent change in bowel habits.   GU:  No history of incontinence.   MUSCLOSKELETAL: No history of joint pain or swelling.  No myalgias.   SKIN: Negative for lesions, rash, and itching.   ENDOCRINE: Negative for cold or heat intolerance, polydipsia or goiter.   PSYCH:  No depression or anxiety symptoms.   NEURO: As Above.   Vital Signs:  BP 150/84 mmHg  Pulse 77  Wt 225 lb 1 oz (102.088 kg)  SpO2 97% Pain Scale: 0 on a scale of 0-10   Neurological Exam: MENTAL STATUS including orientation to time, place, person, recent and remote memory, attention span and concentration, language, and fund of knowledge is normal.  Speech is not dysarthric.  CRANIAL NERVES: Pupils equal round and reactive to light.  Normal conjugate, extra-ocular eye movements in all directions of gaze. Face is  symmetric.  MOTOR:  Motor strength is 5/5 in all extremities.  No pronator drift.  Tone is normal.    MSRs:  Right                                                                 Left brachioradialis 2+  brachioradialis 2+  biceps 1+  biceps 1+  triceps 1+  triceps 1+  patellar 1+  patellar 1+  ankle jerk 0  ankle jerk 0   SENSORY:  Vibration intact throughout (improved).  COORDINATION/GAIT:  Gait narrow  based and stable.  Tandem gait intact.  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  Lab Results  Component Value Date   [id number] 532 11/30/2014   Lab Results  Component Value Date   HGBA1C 6.0 11/30/2014   Labs 01/17/2015:  ESR 9, CRP 0.4, No M protein on SPEP with IFE, zinc 89, copper 99, vitamin B6 30.9, vitamin B1 19  CSF 02/15/2015:  W1 R0 G63 P121*  No OCB, IgG index 0.47, MBP <2.0, ACE neg, crypto neg, cytology neg   IMPRESSION: Mr. Krone is a 55 year-old gentleman returning for follow-up of CIDP (diagnosed 02/2015) manifesting with subacute onset of generalized paresthesias.NCS/EMG shows severe sensorimotor polyradiculoneuropathy and CSF testing showed albuminocytologic dissociation, consistent with CIDP.  He reports having marked improvement of paresthesias with his induction dose of methylprednisolone.  He continues to have generalized hyporeflexia, except for brachioradialis which is normal, but otherwise neurological exam is great.   PLAN/RECOMMENDATIONS:  1.  Stop gabapentin to see if his leg swelling improves.  If no improvement, f/u with PCP.  2.  Start maintenance methylprednisone 1g IV monthly x 3 doses  Return to clinic in 3 months   The duration of this appointment visit was 25 minutes of face-to-face time with the patient.  Greater than 50% of this time was spent in counseling, explanation of diagnosis, planning of further management, and coordination of care.   Thank you for allowing me to participate in patient's care.  If I can answer any additional questions, I would be pleased to do so.    Sincerely,    Donika K. Posey Pronto, DO

## 2015-04-11 NOTE — Patient Instructions (Addendum)
1.  Start monthly steroid infusions 2.  Stop gabapentin.  If your swelling does not improve, follow-up with your primary care provider  Return to clinic in 3 months

## 2015-04-15 ENCOUNTER — Other Ambulatory Visit: Payer: Self-pay | Admitting: *Deleted

## 2015-04-15 DIAGNOSIS — G6181 Chronic inflammatory demyelinating polyneuritis: Secondary | ICD-10-CM

## 2015-04-15 MED ORDER — METHYLPREDNISOLONE SODIUM SUCC 1000 MG IJ SOLR: 1000.0000 mg | Freq: Once | INTRAMUSCULAR | Status: DC

## 2015-04-18 ENCOUNTER — Ambulatory Visit (HOSPITAL_COMMUNITY)
Admission: RE | Admit: 2015-04-18 | Discharge: 2015-04-18 | Disposition: A | Discharge status: Home / Self Care | Payer: 59 | Source: Ambulatory Visit | Attending: Neurology | Admitting: Neurology

## 2015-04-18 DIAGNOSIS — G6181 Chronic inflammatory demyelinating polyneuritis: Secondary | ICD-10-CM | POA: Diagnosis not present

## 2015-04-18 MED ORDER — SODIUM CHLORIDE 0.9 % IV SOLN
1000.0000 mg | Freq: Once | INTRAVENOUS | Status: DC
  Administered 2015-04-18: 1000 mg via INTRAVENOUS
  Filled 2015-04-18: qty 8

## 2015-05-13 ENCOUNTER — Other Ambulatory Visit (HOSPITAL_COMMUNITY): Payer: Self-pay | Admitting: *Deleted

## 2015-05-16 ENCOUNTER — Ambulatory Visit (HOSPITAL_COMMUNITY)
Admission: RE | Admit: 2015-05-16 | Discharge: 2015-05-16 | Disposition: A | Discharge status: Home / Self Care | Payer: 59 | Source: Ambulatory Visit | Attending: Neurology | Admitting: Neurology

## 2015-05-16 ENCOUNTER — Encounter (HOSPITAL_COMMUNITY): Payer: 59

## 2015-05-16 DIAGNOSIS — G6181 Chronic inflammatory demyelinating polyneuritis: Secondary | ICD-10-CM | POA: Diagnosis not present

## 2015-05-16 MED ORDER — SODIUM CHLORIDE 0.9 % IV SOLN
1000.0000 mg | Freq: Once | INTRAVENOUS | Status: AC
  Administered 2015-05-16: 1000 mg via INTRAVENOUS
  Filled 2015-05-16: qty 8

## 2015-06-01 ENCOUNTER — Encounter: Payer: Self-pay | Admitting: Family

## 2015-06-01 ENCOUNTER — Ambulatory Visit (INDEPENDENT_AMBULATORY_CARE_PROVIDER_SITE_OTHER): Payer: 59 | Admitting: Family

## 2015-06-01 VITALS — BP 138/84 | HR 79 | Temp 98.4°F | Ht 72.0 in | Wt 221.0 lb

## 2015-06-01 DIAGNOSIS — E119 Type 2 diabetes mellitus without complications: Secondary | ICD-10-CM | POA: Diagnosis not present

## 2015-06-01 MED ORDER — METFORMIN HCL ER 500 MG PO TB24: 500.0000 mg | ORAL_TABLET | Freq: Every day | ORAL | Status: DC

## 2015-06-01 NOTE — Assessment & Plan Note (Signed)
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 Restart metformin. Continue to monitor blood sugar at home as able. Follow-up in 3 months or sooner if needed.

## 2015-06-01 NOTE — Progress Notes (Signed)
   Subjective:    [name], male    DOB: 31-Aug-1960, 55 y.o.   MRN: [id number]  Chief Complaint  Patient presents with  . Blood Sugar Problem    A1C was 6.8 05/03/2015, pt hasn't checked CBG in over a month b/c he is out of lancets    HPI:  Austin Meadows is a 55 y.o. male who  has a past medical history of Chicken pox; Jaundice of newborn; Hyperlipidemia; Rheumatic fever; UTI (lower urinary tract infection); Hemorrhoid; and Diabetes mellitus without complication ([place]). and presents today for a follow up office visit.   1.) Elevated A1c - Recently completed blood work following Guillan-Barre which he is being treated for with IV steroid infusions and was noted to have an elevated hemoglobin A1c of 6.8. Does experience increased hunger, thirst and urination. No fatigue or numbness or tingling.  Lab Results  Component Value Date   HGBA1C 6.0 11/30/2014   Allergies  Allergen Reactions  . Influenza Vaccines Other (See Comments)    Guillain barre syndrome  . Penicillins Other (See Comments)    Pt can't remember reaction.     No current outpatient prescriptions on file prior to visit.   Current Facility-Administered Medications on File Prior to Visit  Medication Dose Route Frequency Provider Last Rate Last Dose  . methylPREDNISolone sodium succinate (SOLU-MEDROL) injection 1,000 mg  1,000 mg Intravenous Q6H Donika K Patel, DO      . methylPREDNISolone sodium succinate (SOLU-MEDROL) injection 1,000 mg  1,000 mg Intravenous Once Donika K Patel, DO      . methylPREDNISolone sodium succinate (SOLU-MEDROL) injection 1,000 mg  1,000 mg Intravenous Once Alda Berthold, DO        Review of Systems  Constitutional: Negative for fever and chills.  Endocrine: Negative for polydipsia, polyphagia and polyuria.      Objective:    BP 138/84 mmHg  Pulse 79  Temp(Src) 98.4 F (36.9 C) (Oral)  Ht 6' (1.829 m)  Wt 221 lb (100.245 kg)  BMI 29.97 kg/m2  SpO2 97% Nursing note and  vital signs reviewed.  Physical Exam  Constitutional: He is oriented to person, place, and time. He appears well-developed and well-nourished. No distress.  Cardiovascular: Normal rate, regular rhythm, normal heart sounds and intact distal pulses.   Pulmonary/Chest: Effort normal and breath sounds normal.  Neurological: He is alert and oriented to person, place, and time.  Skin: Skin is warm and dry.  Psychiatric: He has a normal mood and affect. His behavior is normal. Judgment and thought content normal.       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 Restart metformin. Continue to monitor blood sugar at home as able. Follow-up in 3 months or sooner if needed.      Relevant Medications   metFORMIN (GLUCOPHAGE XR) 500 MG 24 hr tablet

## 2015-06-01 NOTE — Patient Instructions (Signed)
Thank you for choosing [facility].  Summary/Instructions:  Please start taking the metformin.   Your prescription(s) have been submitted to your pharmacy or been printed and provided for you. Please take as directed and contact our office if you believe you are having problem(s) with the medication(s) or have any questions.  If your symptoms worsen or fail to improve, please contact our office for further instruction, or in case of emergency go directly to the emergency room at the closest medical facility.

## 2015-06-10 ENCOUNTER — Other Ambulatory Visit (HOSPITAL_COMMUNITY): Payer: Self-pay | Admitting: *Deleted

## 2015-06-13 ENCOUNTER — Encounter (HOSPITAL_COMMUNITY)
Admission: RE | Admit: 2015-06-13 | Discharge: 2015-06-13 | Disposition: A | Discharge status: Home / Self Care | Payer: 59 | Source: Ambulatory Visit | Attending: Neurology | Admitting: Neurology

## 2015-06-13 DIAGNOSIS — G610 Guillain-Barre syndrome: Secondary | ICD-10-CM | POA: Diagnosis not present

## 2015-06-13 DIAGNOSIS — G609 Hereditary and idiopathic neuropathy, unspecified: Secondary | ICD-10-CM | POA: Insufficient documentation

## 2015-06-13 MED ORDER — SODIUM CHLORIDE 0.9 % IV SOLN
1000.0000 mg | INTRAVENOUS | Status: DC
Start: 2015-06-13 — End: 2015-06-17
  Administered 2015-06-13: 1000 mg via INTRAVENOUS
  Filled 2015-06-13: qty 8

## 2015-07-20 ENCOUNTER — Ambulatory Visit: Payer: 59 | Admitting: Neurology

## 2015-07-21 ENCOUNTER — Ambulatory Visit: Payer: 59 | Admitting: Neurology

## 2015-11-18 ENCOUNTER — Other Ambulatory Visit: Payer: Self-pay | Admitting: Family

## 2016-03-14 ENCOUNTER — Other Ambulatory Visit (HOSPITAL_COMMUNITY): Payer: Self-pay | Admitting: Emergency Medicine

## 2016-03-14 DIAGNOSIS — R52 Pain, unspecified: Secondary | ICD-10-CM

## 2016-03-19 ENCOUNTER — Encounter: Payer: Self-pay | Admitting: Family

## 2016-03-19 ENCOUNTER — Ambulatory Visit (INDEPENDENT_AMBULATORY_CARE_PROVIDER_SITE_OTHER): Payer: 59 | Admitting: Family

## 2016-03-19 DIAGNOSIS — S39012A Strain of muscle, fascia and tendon of lower back, initial encounter: Secondary | ICD-10-CM | POA: Diagnosis not present

## 2016-03-19 MED ORDER — DICLOFENAC SODIUM 2 % TD SOLN: 1.0000 "application " | Freq: Two times a day (BID) | TRANSDERMAL | 1 refills | Status: DC | PRN

## 2016-03-19 MED ORDER — IBUPROFEN-FAMOTIDINE 800-26.6 MG PO TABS: 1.0000 | ORAL_TABLET | Freq: Three times a day (TID) | ORAL | 1 refills | Status: DC | PRN

## 2016-03-19 MED ORDER — TIZANIDINE HCL 4 MG PO TABS: 4.0000 mg | ORAL_TABLET | Freq: Four times a day (QID) | ORAL | 0 refills | Status: DC | PRN

## 2016-03-19 NOTE — Assessment & Plan Note (Addendum)
Symptoms and exam consistent with low back strain. Treat conservatively with ice/moist heat, home exercise therapy, and start Pennsaid and Duexis. Start Zanaflex as needed for muscle spasm.  Encouraged proper lifting technique. Follow-up if symptoms worsen or do not improve for potential therapy and/or imaging if indicated.

## 2016-03-19 NOTE — Patient Instructions (Addendum)
Thank you for choosing [facility]!  Ice / moist heat x 20 minutes every 2 hours and as needed or following activity  Pennsaid - Approximately 1/2 packet to the affected site twice daily.  Duexis - 1 tablet 3 times per day for the next 5-7 days and then as needed.  Exercises 1-2 times per day as instructed.   You will receive a call from [facility] regarding your Pennsaid/Duexis/Vimovo. The medication will be mailed to you and should cost you no more than $10 per item or possibly free depending upon your insurance.   You will receive a call to schedule your imaging, appointment or physical therapy.  Your prescription(s) have been submitted to your pharmacy or been printed and provided for you. Please take as directed and contact our office if you believe you are having problem(s) with the medication(s) or have any questions.  If your symptoms worsen or fail to improve, please contact our office for further instruction, or in case of emergency go directly to the emergency room at the closest medical facility.    Low Back Strain Rehab Ask your health care provider which exercises are safe for you. Do exercises exactly as told by your health care provider and adjust them as directed. It is normal to feel mild stretching, pulling, tightness, or discomfort as you do these exercises, but you should stop right away if you feel sudden pain or your pain gets worse. Do not begin these exercises until told by your health care provider. Stretching and range of motion exercises These exercises warm up your muscles and joints and improve the movement and flexibility of your back. These exercises also help to relieve pain, numbness, and tingling. Exercise A: Single knee to chest 1. Lie on your back on a firm surface with both legs straight. 2. Bend one of your knees. Use your hands to move your knee up toward your chest until you feel a gentle stretch in your lower back and buttock.  Hold  your leg in this position by holding onto the front of your knee.  Keep your other leg as straight as possible. 3. Hold for __________ seconds. 4. Slowly return to the starting position. 5. Repeat with your other leg. Repeat __________ times. Complete this exercise __________ times a day. Exercise B: Prone extension on elbows 1. Lie on your abdomen on a firm surface. 2. Prop yourself up on your elbows. 3. Use your arms to help lift your chest up until you feel a gentle stretch in your abdomen and your lower back.  This will place some of your body weight on your elbows. If this is uncomfortable, try stacking pillows under your chest.  Your hips should stay down, against the surface that you are lying on. Keep your hip and back muscles relaxed. 4. Hold for __________ seconds. 5. Slowly relax your upper body and return to the starting position. Repeat __________ times. Complete this exercise __________ times a day. Strengthening exercises These exercises build strength and endurance in your back. Endurance is the ability to use your muscles for a long time, even after they get tired. Exercise C: Pelvic tilt 1. Lie on your back on a firm surface. Bend your knees and keep your feet flat. 2. Tense your abdominal muscles. Tip your pelvis up toward the ceiling and flatten your lower back into the floor.  To help with this exercise, you may place a small towel under your lower back and try to push your back  into the towel. 3. Hold for __________ seconds. 4. Let your muscles relax completely before you repeat this exercise. Repeat __________ times. Complete this exercise __________ times a day. Exercise D: Alternating arm and leg raises 1. Get on your hands and knees on a firm surface. If you are on a hard floor, you may want to use padding to cushion your knees, such as an exercise mat. 2. Line up your arms and legs. Your hands should be below your shoulders, and your knees should be below your  hips. 3. Lift your left leg behind you. At the same time, raise your right arm and straighten it in front of you.  Do not lift your leg higher than your hip.  Do not lift your arm higher than your shoulder.  Keep your abdominal and back muscles tight.  Keep your hips facing the ground.  Do not arch your back.  Keep your balance carefully, and do not hold your breath. 4. Hold for __________ seconds. 5. Slowly return to the starting position and repeat with your right leg and your left arm. Repeat __________ times. Complete this exercise __________times a day. Exercise J: Single leg lower with bent knees 1. Lie on your back on a firm surface. 2. Tense your abdominal muscles and lift your feet off the floor, one foot at a time, so your knees and hips are bent in an "L" shape (at about 90 degrees).  Your knees should be over your hips and your lower legs should be parallel to the floor. 3. Keeping your abdominal muscles tense and your knee bent, slowly lower one of your legs so your toe touches the ground. 4. Lift your leg back up to return to the starting position.  Do not hold your breath.  Do not let your back arch. Keep your back flat against the ground. 5. Repeat with your other leg. Repeat __________ times. Complete this exercise __________ times a day. Posture and body mechanics   Body mechanics refers to the movements and positions of your body while you do your daily activities. Posture is part of body mechanics. Good posture and healthy body mechanics can help to relieve stress in your body's tissues and joints. Good posture means that your spine is in its natural S-curve position (your spine is neutral), your shoulders are pulled back slightly, and your head is not tipped forward. The following are general guidelines for applying improved posture and body mechanics to your everyday activities. Standing   When standing, keep your spine neutral and your feet about hip-width  apart. Keep a slight bend in your knees. Your ears, shoulders, and hips should line up.  When you do a task in which you stand in one place for a long time, place one foot up on a stable object that is 2-4 inches (5-10 cm) high, such as a footstool. This helps keep your spine neutral. Sitting  When sitting, keep your spine neutral and keep your feet flat on the floor. Use a footrest, if necessary, and keep your thighs parallel to the floor. Avoid rounding your shoulders, and avoid tilting your head forward.  When working at a desk or a computer, keep your desk at a height where your hands are slightly lower than your elbows. Slide your chair under your desk so you are close enough to maintain good posture.  When working at a computer, place your monitor at a height where you are looking straight ahead and you do not have to  tilt your head forward or downward to look at the screen. Resting   When lying down and resting, avoid positions that are most painful for you.  If you have pain with activities such as sitting, bending, stooping, or squatting (flexion-based activities), lie in a position in which your body does not bend very much. For example, avoid curling up on your side with your arms and knees near your chest (fetal position).  If you have pain with activities such as standing for a long time or reaching with your arms (extension-based activities), lie with your spine in a neutral position and bend your knees slightly. Try the following positions:  Lying on your side with a pillow between your knees.  Lying on your back with a pillow under your knees. Lifting   When lifting objects, keep your feet at least shoulder-width apart and tighten your abdominal muscles.  Bend your knees and hips and keep your spine neutral. It is important to lift using the strength of your legs, not your back. Do not lock your knees straight out.  Always ask for help to lift heavy or awkward  objects. This information is not intended to replace advice given to you by your health care provider. Make sure you discuss any questions you have with your health care provider. Document Released: 02/19/2005 Document Revised: 10/27/2015 Document Reviewed: 12/01/2014 Elsevier Interactive Patient Education  2017 [employer].

## 2016-03-19 NOTE — Progress Notes (Signed)
Subjective:    [name], male    DOB: Jun 01, 1960, 56 y.o.   MRN: [id number]  Chief Complaint  Patient presents with  . Back Pain    x2 days has had bad back pain in lower right side, pennsaid that his wife had was helping    HPI:  Austin Meadows is a 56 y.o. male who  has a past medical history of Chicken pox; Diabetes mellitus without complication ([place]); Hemorrhoid; Hyperlipidemia; Jaundice of newborn; Rheumatic fever; and UTI (lower urinary tract infection). and presents today for an office visit.    This is a new problem. Associated symptom of pain located in his lower back on the right side has been going on for about 2 days. No trauma or injury. Pain is described as achy.  Modifying factors include Motrin and Pennsaid which seemed to help. No radiculopathy. Denies any changes to bowel or saddle anesthesia.   Allergies  Allergen Reactions  . Influenza Vaccines Other (See Comments)    Guillain barre syndrome  . Penicillins Other (See Comments)    Pt can't remember reaction.      Outpatient Medications Prior to Visit  Medication Sig Dispense Refill  . metFORMIN (GLUCOPHAGE-XR) 500 MG 24 hr tablet TAKE ONE TABLET BY MOUTH ONCE DAILY WITH BREAKFAST 90 tablet 1  . methylPREDNISolone sodium succinate (SOLU-MEDROL) injection 1,000 mg     . methylPREDNISolone sodium succinate (SOLU-MEDROL) injection 1,000 mg     . methylPREDNISolone sodium succinate (SOLU-MEDROL) injection 1,000 mg      No facility-administered medications prior to visit.      Past Surgical History:  Procedure Laterality Date  . COLONOSCOPY  ages [age]   done at [facility] and [facility](pt unable to get records)    Past Medical History:  Diagnosis Date  . Chicken pox   . Diabetes mellitus without complication ([place])    type 2  . Hemorrhoid   . Hyperlipidemia   . Jaundice of newborn   . Rheumatic fever   . UTI (lower urinary tract infection)       Review of Systems    Constitutional: Negative for chills and fever.  Musculoskeletal: Positive for back pain.  Neurological: Negative for weakness and numbness.      Objective:    BP 138/78 (BP Location: Left Arm, Patient Position: Sitting, Cuff Size: Large)   Pulse 81   Temp 98.4 F (36.9 C) (Oral)   Resp 16   Ht 6' (1.829 m)   Wt 229 lb (103.9 kg)   SpO2 96%   BMI 31.06 kg/m  Nursing note and vital signs reviewed.  Physical Exam  Constitutional: He is oriented to person, place, and time. He appears well-developed and well-nourished. No distress.  Cardiovascular: Normal rate, regular rhythm, normal heart sounds and intact distal pulses.   Pulmonary/Chest: Effort normal and breath sounds normal.  Musculoskeletal:  Low back - no obvious deformity, discoloration, or edema. Mild tenderness and spasm located on right side lumbar paraspinal musculature. No crepitus or deformity. Range of motion within normal limits in all directions with some mild discomfort noted in lateral bending. Distal pulses and sensation are intact and appropriate. Negative straight leg raising Faber's. Positive Thomas test on the left.  Neurological: He is alert and oriented to person, place, and time.  Skin: Skin is warm and dry.  Psychiatric: He has a normal mood and affect. His behavior is normal. Judgment and thought content normal.       Assessment &  Plan:   Problem List Items Addressed This Visit      Musculoskeletal and Integument   Low back strain    Symptoms and exam consistent with low back strain. Treat conservatively with ice/moist heat, home exercise therapy, and start Pennsaid and Duexis. Start Zanaflex as needed for muscle spasm.  Encouraged proper lifting technique. Follow-up if symptoms worsen or do not improve for potential therapy and/or imaging if indicated.      Relevant Medications   Ibuprofen-Famotidine 800-26.6 MG TABS   Diclofenac Sodium (PENNSAID) 2 % SOLN   tiZANidine (ZANAFLEX) 4 MG tablet        I am having [name] start on Ibuprofen-Famotidine, Diclofenac Sodium, and tiZANidine. I am also having him maintain his metFORMIN. We will stop administering methylPREDNISolone sodium succinate, methylPREDNISolone sodium succinate, and methylPREDNISolone sodium succinate.   Meds ordered this encounter  Medications  . Ibuprofen-Famotidine 800-26.6 MG TABS    Sig: Take 1 tablet by mouth 3 (three) times daily as needed.    Dispense:  90 tablet    Refill:  1    Order Specific Question:   Supervising Provider    Answer:   Pricilla Holm A [id number]  . Diclofenac Sodium (PENNSAID) 2 % SOLN    Sig: Place 1 application onto the skin 2 (two) times daily as needed.    Dispense:  112 g    Refill:  1    Order Specific Question:   Supervising Provider    Answer:   Pricilla Holm A [id number]  . tiZANidine (ZANAFLEX) 4 MG tablet    Sig: Take 1 tablet (4 mg total) by mouth every 6 (six) hours as needed for muscle spasms.    Dispense:  30 tablet    Refill:  0    Order Specific Question:   Supervising Provider    Answer:   Pricilla Holm A [id number]     Follow-up: Return in about 3 weeks (around 04/09/2016), or if symptoms worsen or fail to improve.  Mauricio Po, FNP

## 2016-03-23 ENCOUNTER — Encounter (HOSPITAL_COMMUNITY): Payer: Self-pay

## 2016-03-23 ENCOUNTER — Ambulatory Visit (HOSPITAL_COMMUNITY)
Admission: RE | Admit: 2016-03-23 | Discharge: 2016-03-23 | Disposition: A | Discharge status: Home / Self Care | Payer: 59 | Source: Ambulatory Visit | Attending: Emergency Medicine | Admitting: Emergency Medicine

## 2016-03-26 ENCOUNTER — Inpatient Hospital Stay (HOSPITAL_COMMUNITY): Admission: RE | Admit: 2016-03-26 | Payer: 59 | Source: Ambulatory Visit

## 2016-04-23 ENCOUNTER — Other Ambulatory Visit: Payer: Self-pay | Admitting: Family

## 2016-04-23 DIAGNOSIS — S39012A Strain of muscle, fascia and tendon of lower back, initial encounter: Secondary | ICD-10-CM

## 2016-04-24 IMAGING — XA DG FLUORO GUIDE LUMBAR PUNCTURE
1 series · 1 of 1 positions shown · non-contrast
Comparison: none

CLINICAL DATA: Paresthesias. Neuropathic pain.
Polyradiculoneuropathy.

[Series 1: ortho standard · 1 of 1 slices shown]
[im 1/1]
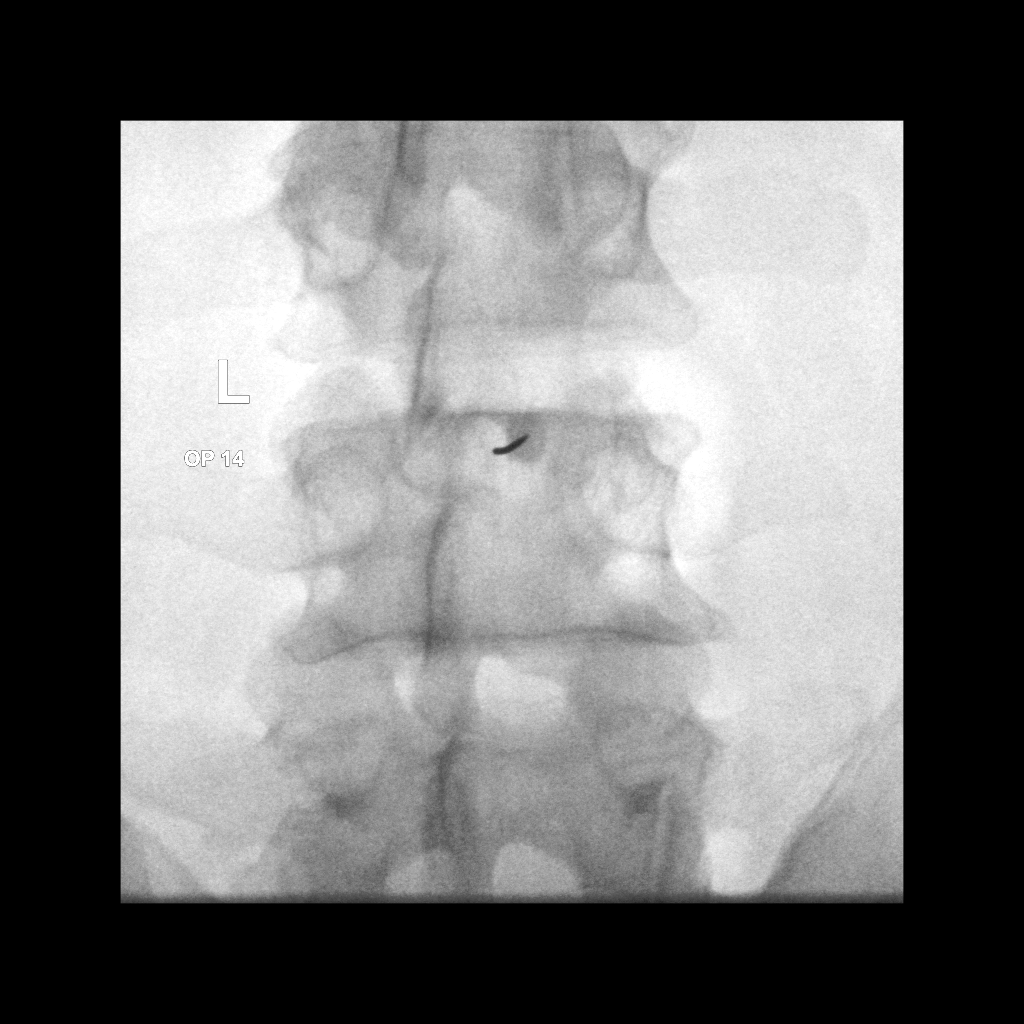

[1 of 1 positions shown; findings below may reference images not displayed]

EXAM:
DIAGNOSTIC LUMBAR PUNCTURE UNDER FLUOROSCOPIC GUIDANCE

FLUOROSCOPY TIME:  Radiation Exposure Index (as provided by the
fluoroscopic device): 39.51 microGray*m^2

Fluoroscopy Time (in minutes and seconds):  0 minutes 8 seconds

PROCEDURE:
Informed consent was obtained from the patient prior to the
procedure, including potential complications of headache, allergy,
and pain. With the patient prone, the lower back was prepped with
Betadine. 1% Lidocaine was used for local anesthesia. Lumbar
puncture was performed at the L3-4 level using a 3.5 inch, 20 gauge
needle via a right interlaminar approach with return of clear CSF
with an opening pressure of 14 cm water (measured in the left
lateral decubitus position). 13.5 ml of CSF were obtained for
laboratory studies. The patient tolerated the procedure well and
there were no apparent complications.
IMPRESSION: Successful fluoroscopic guided lumbar puncture.

## 2016-06-01 ENCOUNTER — Other Ambulatory Visit: Payer: Self-pay | Admitting: Family

## 2016-06-19 ENCOUNTER — Telehealth: Payer: Self-pay | Admitting: Family

## 2016-06-19 NOTE — Telephone Encounter (Signed)
Austin Meadows from [facility] called with questions regarding the pts medication for DUEXIS 800-26.6 MG TABS. She needed to know what it was prescribed and if the pt has used anything prior. Please advise.  Phone: [phone] Fax: [phone]

## 2016-06-20 ENCOUNTER — Encounter: Payer: Self-pay | Admitting: Family

## 2016-06-20 ENCOUNTER — Other Ambulatory Visit (INDEPENDENT_AMBULATORY_CARE_PROVIDER_SITE_OTHER): Payer: 59

## 2016-06-20 ENCOUNTER — Ambulatory Visit (INDEPENDENT_AMBULATORY_CARE_PROVIDER_SITE_OTHER): Payer: 59 | Admitting: Family

## 2016-06-20 VITALS — BP 124/88 | HR 76 | Temp 98.4°F | Resp 16 | Ht 72.0 in | Wt 225.6 lb

## 2016-06-20 DIAGNOSIS — S39012A Strain of muscle, fascia and tendon of lower back, initial encounter: Secondary | ICD-10-CM

## 2016-06-20 DIAGNOSIS — E119 Type 2 diabetes mellitus without complications: Secondary | ICD-10-CM | POA: Diagnosis not present

## 2016-06-20 DIAGNOSIS — Z7289 Other problems related to lifestyle: Secondary | ICD-10-CM

## 2016-06-20 LAB — COMPREHENSIVE METABOLIC PANEL
ALK PHOS: 50 U/L (ref 39–117)
ALT: 28 U/L (ref 0–53)
AST: 18 U/L (ref 0–37)
Albumin: 4.7 g/dL (ref 3.5–5.2)
BILIRUBIN TOTAL: 0.5 mg/dL (ref 0.2–1.2)
BUN: 20 mg/dL (ref 6–23)
CALCIUM: 10 mg/dL (ref 8.4–10.5)
CO2: 29 meq/L (ref 19–32)
CREATININE: 0.85 mg/dL (ref 0.40–1.50)
Chloride: 101 mEq/L (ref 96–112)
GFR: 120.94 mL/min (ref >60.00)
GLUCOSE: 102 mg/dL — AB (ref 70–99)
Potassium: 4.7 mEq/L (ref 3.5–5.1)
Sodium: 137 mEq/L (ref 135–145)
TOTAL PROTEIN: 7.7 g/dL (ref 6.0–8.3)

## 2016-06-20 LAB — MICROALBUMIN / CREATININE URINE RATIO
Creatinine,U: 141.3 mg/dL
MICROALB/CREAT RATIO: 0.5 mg/g (ref 0.0–30.0)
Microalb, Ur: <0.7 mg/dL (ref 0.0–1.9)

## 2016-06-20 LAB — HEMOGLOBIN A1C: Hgb A1c MFr Bld: 6.8 % — ABNORMAL HIGH (ref 4.6–6.5)

## 2016-06-20 MED ORDER — IBUPROFEN-FAMOTIDINE 800-26.6 MG PO TABS: 1.0000 | ORAL_TABLET | Freq: Three times a day (TID) | ORAL | 3 refills | Status: DC | PRN

## 2016-06-20 MED ORDER — METFORMIN HCL ER 500 MG PO TB24: ORAL_TABLET | ORAL | 1 refills | Status: DC

## 2016-06-20 NOTE — Assessment & Plan Note (Signed)
Low back strain with occasional exacerbations well managed with current dosage of Duexis. No adverse side effects. Encouraged continued ice/moist heat, home exercise therapy, and medications as needed. Follow-up if symptoms worsen or do not improve.

## 2016-06-20 NOTE — Assessment & Plan Note (Signed)
Type 2 diabetes appears adequately controlled current medication regimen and no adverse side effects or hypoglycemic readings. Diabetic foot exam completed today. Declines Pneumovax. Encouraged complete diabetic eye exam independently. Obtain hemoglobin A1c and urine microalbumin. Obtain complete metabolic profile to check kidney and liver and electrolytes function. Continue to monitor blood sugars at home. Continue current dosage of metformin pending A1c results.

## 2016-06-20 NOTE — Patient Instructions (Signed)
Thank you for choosing [facility].  SUMMARY AND INSTRUCTIONS:  Please continue to take your medications as prescribed.   Medication:  Your prescription(s) have been submitted to your pharmacy or been printed and provided for you. Please take as directed and contact our office if you believe you are having problem(s) with the medication(s) or have any questions.  Labs:  Please stop by the lab on the lower level of the building for your blood work. Your results will be released to [facility] (or called to you) after review, usually within 72 hours after test completion. If any changes need to be made, you will be notified at that same time.  1.) The lab is open from 7:30am to 5:30 pm Monday-Friday 2.) No appointment is necessary 3.) Fasting (if needed) is 6-8 hours after food and drink; black coffee and water are okay   Follow up:  If your symptoms worsen or fail to improve, please contact our office for further instruction, or in case of emergency go directly to the emergency room at the closest medical facility.

## 2016-06-20 NOTE — Progress Notes (Signed)
 Subjective:    [name], male    DOB: 02/26/1961, 55 y.o.   MRN: [id number]  Chief Complaint  Patient presents with  . Medication Refill    metformin and duexis     HPI:  Austin Meadows is a 55 y.o. male who  has a past medical history of Chicken pox; Diabetes mellitus without complication (HCC); Hemorrhoid; Hyperlipidemia; Jaundice of newborn; Rheumatic fever; and UTI (lower urinary tract infection). and presents today for a follow up office visit.   1.) Diabetes - Currently maintained on metformin. Reports taking medication as prescribed and denies adverse side effects. Blood sugars at home have been well controlled. Working on following a low/carbohydrate modify diet. No numbness or tingling. No hypoglycemia.   Lab Results  Component Value Date   HGBA1C 6.0 11/30/2014    2.) Low back - Continues to experience the associated symptom of low back pain that is currently managed with Duexis that he reports taking as prescribed and denies adverse side effects. Symptoms are well controlled with the current medication regimen. He is able to function normally and complete his activities of daily living.   Allergies  Allergen Reactions  . Influenza Vaccines Other (See Comments)    Guillain barre syndrome  . Penicillins Other (See Comments)    Pt can't remember reaction.      Outpatient Medications Prior to Visit  Medication Sig Dispense Refill  . tiZANidine (ZANAFLEX) 4 MG tablet Take 1 tablet (4 mg total) by mouth every 6 (six) hours as needed for muscle spasms. 30 tablet 0  . Diclofenac Sodium (PENNSAID) 2 % SOLN Place 1 application onto the skin 2 (two) times daily as needed. 112 g 1  . DUEXIS 800-26.6 MG TABS TAKE ONE TABLET BY MOUTH THREE TIMES DAILY AS NEEDED 90 tablet 0  . metFORMIN (GLUCOPHAGE-XR) 500 MG 24 hr tablet Take one tablet by mouth once daily with. Needs office visit for more refills 30 tablet 0   No facility-administered medications prior to visit.      Review of Systems  Constitutional: Negative for chills and fever.  Eyes:       Negative for changes in vision.  Respiratory: Negative for chest tightness and shortness of breath.   Cardiovascular: Negative for chest pain, palpitations and leg swelling.  Endocrine: Negative for polydipsia, polyphagia and polyuria.  Musculoskeletal: Positive for back pain.  Neurological: Negative for dizziness, weakness, light-headedness, numbness and headaches.      Objective:    BP 124/88 (BP Location: Left Arm, Patient Position: Sitting, Cuff Size: Large)   Pulse 76   Temp 98.4 F (36.9 C) (Oral)   Resp 16   Ht 6' (1.829 m)   Wt 225 lb 9.6 oz (102.3 kg)   SpO2 94%   BMI 30.60 kg/m  Nursing note and vital signs reviewed.  Physical Exam  Constitutional: He is oriented to person, place, and time. He appears well-developed and well-nourished. No distress.  Cardiovascular: Normal rate, regular rhythm, normal heart sounds and intact distal pulses.   Pulmonary/Chest: Effort normal and breath sounds normal.  Neurological: He is alert and oriented to person, place, and time.  Diabetic foot exam - bilateral feet are free from skin breakdown, cuts, and abrasions. Toenails are neatly trimmed. Pulses are intact and appropriate. Sensation is intact to monofilament bilaterally.  Skin: Skin is warm and dry.  Psychiatric: He has a normal mood and affect. His behavior is normal. Judgment and thought content normal.         Assessment & Plan:   Problem List Items Addressed This Visit      Endocrine   Type 2 diabetes mellitus (HCC) - Primary    Type 2 diabetes appears adequately controlled current medication regimen and no adverse side effects or hypoglycemic readings. Diabetic foot exam completed today. Declines Pneumovax. Encouraged complete diabetic eye exam independently. Obtain hemoglobin A1c and urine microalbumin. Obtain complete metabolic profile to check kidney and liver and electrolytes function.  Continue to monitor blood sugars at home. Continue current dosage of metformin pending A1c results.      Relevant Medications   metFORMIN (GLUCOPHAGE-XR) 500 MG 24 hr tablet   Other Relevant Orders   Comp Met (CMET)   Urine Microalbumin w/creat. ratio   Hemoglobin A1c     Musculoskeletal and Integument   Low back strain    Low back strain with occasional exacerbations well managed with current dosage of Duexis. No adverse side effects. Encouraged continued ice/moist heat, home exercise therapy, and medications as needed. Follow-up if symptoms worsen or do not improve.      Relevant Medications   Ibuprofen-Famotidine (DUEXIS) 800-26.6 MG TABS    Other Visit Diagnoses    Other problems related to lifestyle       Relevant Orders   Hepatitis C antibody       I have discontinued Mr. Swanton's Diclofenac Sodium. I have also changed his DUEXIS to Ibuprofen-Famotidine. Additionally, I am having him maintain his tiZANidine and metFORMIN.   Meds ordered this encounter  Medications  . metFORMIN (GLUCOPHAGE-XR) 500 MG 24 hr tablet    Sig: Take one tablet by mouth once daily with. Needs office visit for more refills    Dispense:  90 tablet    Refill:  1    Order Specific Question:   Supervising Provider    Answer:   CRAWFORD, ELIZABETH A [[id number]]  . Ibuprofen-Famotidine (DUEXIS) 800-26.6 MG TABS    Sig: Take 1 tablet by mouth 3 (three) times daily as needed.    Dispense:  90 tablet    Refill:  3    Order Specific Question:   Supervising Provider    Answer:   CRAWFORD, ELIZABETH A [[id number]]     Follow-up: Return in about 6 months (around 12/20/2016), or if symptoms worsen or fail to improve.  Calone, Gregory, FNP   

## 2016-06-21 ENCOUNTER — Encounter: Payer: Self-pay | Admitting: Family

## 2016-06-21 LAB — HEPATITIS C ANTIBODY: HCV Ab: NEGATIVE

## 2016-06-21 NOTE — Telephone Encounter (Signed)
Medication has been sent to another pharmacy.

## 2017-01-11 ENCOUNTER — Other Ambulatory Visit: Payer: Self-pay | Admitting: Family

## 2017-01-11 NOTE — Telephone Encounter (Signed)
Pt called in and needs refills on his metFORMIN (GLUCOPHAGE-XR) 500 MG 24 hr tablet [[id number]]  Has an est appt with [facility] in jan

## 2017-01-14 MED ORDER — METFORMIN HCL ER 500 MG PO TB24: ORAL_TABLET | ORAL | 0 refills | Status: DC

## 2017-01-14 NOTE — Telephone Encounter (Signed)
erx done

## 2017-02-04 ENCOUNTER — Other Ambulatory Visit (INDEPENDENT_AMBULATORY_CARE_PROVIDER_SITE_OTHER): Payer: 59

## 2017-02-04 ENCOUNTER — Ambulatory Visit (INDEPENDENT_AMBULATORY_CARE_PROVIDER_SITE_OTHER)
Admission: RE | Admit: 2017-02-04 | Discharge: 2017-02-04 | Disposition: A | Discharge status: Home / Self Care | Payer: 59 | Source: Ambulatory Visit | Attending: Nurse Practitioner | Admitting: Nurse Practitioner

## 2017-02-04 ENCOUNTER — Encounter: Payer: Self-pay | Admitting: Nurse Practitioner

## 2017-02-04 ENCOUNTER — Ambulatory Visit (INDEPENDENT_AMBULATORY_CARE_PROVIDER_SITE_OTHER): Payer: 59 | Admitting: Nurse Practitioner

## 2017-02-04 VITALS — BP 140/82 | HR 74 | Temp 98.5°F | Resp 16 | Ht 72.0 in | Wt 231.0 lb

## 2017-02-04 DIAGNOSIS — M25512 Pain in left shoulder: Secondary | ICD-10-CM

## 2017-02-04 DIAGNOSIS — Z1159 Encounter for screening for other viral diseases: Secondary | ICD-10-CM

## 2017-02-04 DIAGNOSIS — Z23 Encounter for immunization: Secondary | ICD-10-CM

## 2017-02-04 DIAGNOSIS — Z114 Encounter for screening for human immunodeficiency virus [HIV]: Secondary | ICD-10-CM | POA: Diagnosis not present

## 2017-02-04 DIAGNOSIS — E119 Type 2 diabetes mellitus without complications: Secondary | ICD-10-CM | POA: Diagnosis not present

## 2017-02-04 LAB — HEMOGLOBIN A1C: HEMOGLOBIN A1C: 6.7 % — AB (ref 4.6–6.5)

## 2017-02-04 MED ORDER — LANCETS MISC: 3 refills | Status: DC

## 2017-02-04 MED ORDER — MELOXICAM 7.5 MG PO TABS: 7.5000 mg | ORAL_TABLET | Freq: Every day | ORAL | 0 refills | Status: DC

## 2017-02-04 NOTE — Assessment & Plan Note (Signed)
-   Hemoglobin A1c; Future - Basic metabolic panel; Future Lancets refilled. Continue metformin at current dosage pending lab results.

## 2017-02-04 NOTE — Progress Notes (Signed)
Subjective:    [name], male    DOB: Jul 07, 1960, 56 y.o.   MRN: [id number]  HPI Austin Meadows is a 56 yo male who presents today to establish care. He is transferring to me from another provider in the same clinic. He presents today with chief complaint of shoulder pain.  Left shoulder pain- This is an acute problem. The problem began about a week and a half ago. The pain is a constant aching pain in his left shoulder. The pain does not radiate. The pain is worse when he lays on the shoulder or with activity. He denies any injuries or heavy lifting prior. He does report lifting heavy weights in the gym this past year, but has not been to the gym in about 2 months. He had some leftover duexis and pennsaid which he used with some pain reduction.  He denies weakness, numbness, tingling, radiation of pain.  Diabetes- Maintained on metformin. Reports daily compliance with no adverse effects. He does check his blood sugars occasionally at home but no recently as he ran out of lancet. He reports polyuria. He denies diaphoresis, palpitations, tremor, polydipsia, polyphagia, dysuria, hematuria. He has not been for routine eye exams but does plan to follow up with an ophthalmologist when he gets new insurance coverage this coming January.  Review of Systems  See HPI  Past Medical History:  Diagnosis Date  . Chicken pox   . Diabetes mellitus without complication ([place])    type 2  . Hemorrhoid   . Hyperlipidemia   . Jaundice of newborn   . Rheumatic fever   . UTI (lower urinary tract infection)      Social History   Socioeconomic History  . Marital status: Married    Spouse name: Not on file  . Number of children: 5  . Years of education: 47  . Highest education level: Not on file  Social Needs  . Financial resource strain: Not on file  . Food insecurity - worry: Not on file  . Food insecurity - inability: Not on file  . Transportation needs - medical: Not on file  .  Transportation needs - non-medical: Not on file  Occupational History  . Occupation: Diplomatic Services operational officer  Tobacco Use  . Smoking status: Former Smoker    Last attempt to quit: 06/04/1994    Years since quitting: 22.6  . Smokeless tobacco: Never Used  Substance and Sexual Activity  . Alcohol use: Yes    Alcohol/week: 0.0 oz    Comment: Occasionally (one drink per week).  Heavier drinking in the [employer] for ~ 8 years.   . Drug use: No  . Sexual activity: Not on file  Other Topics Concern  . Not on file  Social History Narrative   Currently has 5 children; wife has 3 children.  Lives with his wife; Fun: rides a motorcycle.   Denies any religious beliefs that would effect health care.    Lives in a 2 story home.     Works as a Diplomatic Services operational officer.  Education: associate degree.    Past Surgical History:  Procedure Laterality Date  . COLONOSCOPY  ages [age]   done at [facility] and [facility](pt unable to get records)    Family History  Problem Relation Age of Onset  . Arthritis Mother   . Cancer Mother        bone cancer  . Colon cancer Father 22  . Prostate cancer Father   . Heart  disease Father   . Hypertension Father   . Diabetes Father   . Colon cancer Brother 62       late 66's when dx  . Cancer Brother        another cancer METS to brain  . Colon cancer Brother 19  . Colon cancer Other 71       Bill's daughter  . Healthy Son        x 4  . Healthy Daughter        x 1    Allergies  Allergen Reactions  . Influenza Vaccines Other (See Comments)    Guillain barre syndrome  . Penicillins Other (See Comments)    Pt can't remember reaction.    Current Outpatient Medications on File Prior to Visit  Medication Sig Dispense Refill  . metFORMIN (GLUCOPHAGE-XR) 500 MG 24 hr tablet Take one tablet by mouth once daily with. Needs office visit for more refills 90 tablet 0   No current facility-administered medications on file prior to visit.     BP 140/82  (BP Location: Right Arm, Patient Position: Sitting, Cuff Size: Large)   Pulse 74   Temp 98.5 F (36.9 C) (Oral)   Resp 16   Ht 6' (1.829 m)   Wt 231 lb (104.8 kg)   SpO2 98%   BMI 31.33 kg/m       Objective:   Physical Exam  Constitutional: He is oriented to person, place, and time. He appears well-developed and well-nourished. No distress.  HENT:  Head: Normocephalic and atraumatic.  Cardiovascular: Normal rate, regular rhythm, normal heart sounds and intact distal pulses.  Pulmonary/Chest: Effort normal and breath sounds normal.  Musculoskeletal:       Left shoulder: He exhibits decreased range of motion. He exhibits no bony tenderness, no swelling, no deformity and normal strength.  ROM limited by pain.  Neurological: He is alert and oriented to person, place, and time. Coordination normal.  Skin: Skin is warm and dry.  Psychiatric: He has a normal mood and affect. Judgment and thought content normal.      Assessment & Plan:  Return for CPE in January.  Screening for HIV (human immunodeficiency virus) - HIV antibody; Future  Acute pain of left shoulder - meloxicam (MOBIC) 7.5 MG tablet; Take 1 tablet (7.5 mg total) by mouth daily.  Dispense: 14 tablet; Refill: 0 - DG Shoulder Left; Future Instructed not to combine mobic with duexis, pennsaid. He will follow up with sports medicine provider in our clinic for shoulder pain.  Need for Tdap vaccination - Tdap vaccine greater than or equal to 7yo IM  Need for 23-polyvalent pneumococcal polysaccharide vaccine - Pneumococcal polysaccharide vaccine 23-valent greater than or equal to 2yo subcutaneous/IM  Need for hepatitis C screening test - Hepatitis C Antibody; Future

## 2017-02-04 NOTE — Patient Instructions (Addendum)
Please head downstairs for lab work/x-rays.  For your shoulder pain, I have sent a prescription for mobic 7.5 mg daily to your pharmacy. You can take 1 tablet daily for up to 2 weeks, stop sooner if your pain gets better. I would recommend you make an appointment with Dr Tamala Julian, our sports medicine doctor in the clinic, for further management of your shoulder pain.  DO NOT USE duexis, pennsaid and mobic together.  I have sent a refill of your lancets so that you can check your blood sugars at home.  Ill see you back in January for your annual physical, or sooner if you need me.  It was nice to meet you. Thanks for letting me take care of you today :)

## 2017-02-05 ENCOUNTER — Encounter: Payer: Self-pay | Admitting: Family Medicine

## 2017-02-05 ENCOUNTER — Ambulatory Visit (INDEPENDENT_AMBULATORY_CARE_PROVIDER_SITE_OTHER): Payer: 59 | Admitting: Family Medicine

## 2017-02-05 VITALS — BP 142/82 | HR 76 | Temp 98.6°F | Ht 72.0 in | Wt 231.0 lb

## 2017-02-05 DIAGNOSIS — M7502 Adhesive capsulitis of left shoulder: Secondary | ICD-10-CM | POA: Diagnosis not present

## 2017-02-05 LAB — BASIC METABOLIC PANEL
BUN: 16 mg/dL (ref 6–23)
CHLORIDE: 103 meq/L (ref 96–112)
CO2: 26 mEq/L (ref 19–32)
Calcium: 9.6 mg/dL (ref 8.4–10.5)
Creatinine, Ser: 0.86 mg/dL (ref 0.40–1.50)
GFR: 119.04 mL/min (ref >60.00)
Glucose, Bld: 111 mg/dL — ABNORMAL HIGH (ref 70–99)
POTASSIUM: 4.3 meq/L (ref 3.5–5.1)
Sodium: 138 mEq/L (ref 135–145)

## 2017-02-05 LAB — HIV ANTIBODY (ROUTINE TESTING W REFLEX): HIV: NONREACTIVE

## 2017-02-05 LAB — HEPATITIS C ANTIBODY
Hepatitis C Ab: NONREACTIVE
SIGNAL TO CUT-OFF: 0.02 (ref <1.00)

## 2017-02-05 NOTE — Progress Notes (Signed)
Austin Meadows - 56 y.o. male MRN [id number]  Date of birth: 1960-04-30  SUBJECTIVE:  Including CC & ROS.  Chief Complaint  Patient presents with  . Left Shoulder Pain    present for over 2 weeks-denies injury. Denies loss of motion. He sleeps primarly on his left side.     Austin Meadows is a 56 y.o. male that is .presenting with left shoulder pain. The pain started about 2 weeks ago. Denies any inciting event. Has a history of diabetes. The pain was localized to the anterior lateral shoulder. He reports that he had troubles with his range of motion but has had some improvement since taking the medication. The pain is minimal currently. He had some radiation to his elbow. Denies any radicular type symptoms. Denies any history of surgery on his shoulder. No numbness or tingling.  Patient was seen yesterday and started on meloxicam with duexis and provided Pennsaid.  Independent review of the left shoulder x-ray from 12/3 shows mild degenerative changes within the glenohumeral joint.   Review of Systems  Constitutional: Negative for fever.  Musculoskeletal: Negative for gait problem and joint swelling.  Skin: Negative for color change.  Neurological: Negative for weakness and numbness.  Hematological: Negative for adenopathy.  Psychiatric/Behavioral: Negative for agitation.    HISTORY: Past Medical, Surgical, Social, and Family History Reviewed & Updated per EMR.   Pertinent Historical Findings include:  Past Medical History:  Diagnosis Date  . Chicken pox   . Diabetes mellitus without complication ([place])    type 2  . Hemorrhoid   . Hyperlipidemia   . Jaundice of newborn   . Rheumatic fever   . UTI (lower urinary tract infection)     Past Surgical History:  Procedure Laterality Date  . COLONOSCOPY  ages [id number]   done at [facility](pt unable to get records)    Allergies  Allergen Reactions  . Influenza Vaccines Other (See Comments)    Guillain barre  syndrome  . Penicillins Other (See Comments)    Pt can't remember reaction.    Family History  Problem Relation Age of Onset  . Arthritis Mother   . Cancer Mother        bone cancer  . Colon cancer Father 43  . Prostate cancer Father   . Heart disease Father   . Hypertension Father   . Diabetes Father   . Colon cancer Brother 70       late 75's when dx  . Cancer Brother        another cancer METS to brain  . Colon cancer Brother 61  . Colon cancer Other 44       Bill's daughter  . Healthy Son        x 4  . Healthy Daughter        x 1     Social History   Socioeconomic History  . Marital status: Married    Spouse name: Not on file  . Number of children: 5  . Years of education: 75  . Highest education level: Not on file  Social Needs  . Financial resource strain: Not on file  . Food insecurity - worry: Not on file  . Food insecurity - inability: Not on file  . Transportation needs - medical: Not on file  . Transportation needs - non-medical: Not on file  Occupational History  . Occupation: Diplomatic Services operational officer  Tobacco Use  . Smoking status: Former Smoker    Last  attempt to quit: 06/04/1994    Years since quitting: 22.6  . Smokeless tobacco: Never Used  Substance and Sexual Activity  . Alcohol use: Yes    Alcohol/week: 0.0 oz    Comment: Occasionally (one drink per week).  Heavier drinking in the [employer] for ~ 8 years.   . Drug use: No  . Sexual activity: Not on file  Other Topics Concern  . Not on file  Social History Narrative   Currently has 5 children; wife has 3 children.  Lives with his wife; Fun: rides a motorcycle.   Denies any religious beliefs that would effect health care.    Lives in a 2 story home.     Works as a Diplomatic Services operational officer.  Education: associate degree.     PHYSICAL EXAM:  VS: BP (!) 142/82 (BP Location: Left Arm, Patient Position: Sitting, Cuff Size: Normal)   Pulse 76   Temp 98.6 F (37 C) (Oral)   Ht 6' (1.829 m)   Wt 231 lb  (104.8 kg)   SpO2 98%   BMI 31.33 kg/m  Physical Exam Gen: NAD, alert, cooperative with exam, well-appearing ENT: normal lips, normal nasal mucosa,  Eye: normal EOM, normal conjunctiva and lids CV:  no edema, +2 pedal pulses   Resp: no accessory muscle use, non-labored,  Skin: no rashes, no areas of induration  Neuro: normal tone, normal sensation to touch Psych:  normal insight, alert and oriented MSK:  Left Shoulder: Inspection reveals no abnormalities, atrophy or asymmetry. Palpation is normal with no tenderness over [facility] joint External rotation is limited compared to the right side. Rotator cuff strength normal throughout. No signs of impingement with negative Hawkin's tests, empty can sign. Speeds and Yergason's tests normal. Neurovascularly intact   ASSESSMENT & PLAN:   Adhesive capsulitis of left shoulder X-ray does show some mild glenohumeral arthritis but his symptoms are more suggestive of an early capsulitis. He has normal strength with rotator cuff testing - Counseled on exercises - If pain worsens or he has limitations in his range of motion then would consider an intra-articular injection. Could consider physical therapy go forward.

## 2017-02-05 NOTE — Assessment & Plan Note (Signed)
X-ray does show some mild glenohumeral arthritis but his symptoms are more suggestive of an early capsulitis. He has normal strength with rotator cuff testing - Counseled on exercises - If pain worsens or he has limitations in his range of motion then would consider an intra-articular injection. Could consider physical therapy go forward.

## 2017-02-05 NOTE — Patient Instructions (Signed)
Thank you for coming in,   Please try the exercises on a regular basis.   If the range of motion is getting worse or the pain returns then we can do an injection.    Please feel free to call with any questions or concerns at any time, at [phone]. --Dr. Raeford Razor

## 2017-02-14 ENCOUNTER — Ambulatory Visit: Payer: 59 | Admitting: Family Medicine

## 2017-03-15 ENCOUNTER — Ambulatory Visit: Payer: Self-pay | Admitting: Nurse Practitioner

## 2017-04-12 ENCOUNTER — Other Ambulatory Visit (INDEPENDENT_AMBULATORY_CARE_PROVIDER_SITE_OTHER): Payer: BLUE CROSS/BLUE SHIELD

## 2017-04-12 ENCOUNTER — Encounter: Payer: Self-pay | Admitting: Nurse Practitioner

## 2017-04-12 ENCOUNTER — Ambulatory Visit: Payer: BLUE CROSS/BLUE SHIELD | Admitting: Nurse Practitioner

## 2017-04-12 VITALS — BP 130/80 | HR 76 | Temp 98.4°F | Resp 16 | Ht 72.0 in | Wt 229.0 lb

## 2017-04-12 DIAGNOSIS — Z125 Encounter for screening for malignant neoplasm of prostate: Secondary | ICD-10-CM

## 2017-04-12 DIAGNOSIS — Z5181 Encounter for therapeutic drug level monitoring: Secondary | ICD-10-CM

## 2017-04-12 DIAGNOSIS — E119 Type 2 diabetes mellitus without complications: Secondary | ICD-10-CM

## 2017-04-12 DIAGNOSIS — Z0000 Encounter for general adult medical examination without abnormal findings: Secondary | ICD-10-CM | POA: Insufficient documentation

## 2017-04-12 LAB — LIPID PANEL
Cholesterol: 248 mg/dL — ABNORMAL HIGH (ref 0–200)
HDL: 44.1 mg/dL (ref >39.00)
NonHDL: 203.44
Total CHOL/HDL Ratio: 6
Triglycerides: 242 mg/dL — ABNORMAL HIGH (ref 0.0–149.0)
VLDL: 48.4 mg/dL — ABNORMAL HIGH (ref 0.0–40.0)

## 2017-04-12 LAB — CBC WITH DIFFERENTIAL/PLATELET
BASOS PCT: 1 %
Basophils Absolute: 60 cells/uL (ref 0–200)
Eosinophils Absolute: 180 cells/uL (ref 15–500)
Eosinophils Relative: 3 %
HCT: 42.7 % (ref 38.5–50.0)
Hemoglobin: 14.7 g/dL (ref 13.2–17.1)
Lymphs Abs: 1722 cells/uL (ref 850–3900)
MCH: 28.1 pg (ref 27.0–33.0)
MCHC: 34.4 g/dL (ref 32.0–36.0)
MCV: 81.5 fL (ref 80.0–100.0)
MONOS PCT: 10 %
MPV: 11.1 fL (ref 7.5–12.5)
Neutro Abs: 3438 cells/uL (ref 1500–7800)
Neutrophils Relative %: 57.3 %
Platelets: 255 10*3/uL (ref 140–400)
RBC: 5.24 10*6/uL (ref 4.20–5.80)
RDW: 13.3 % (ref 11.0–15.0)
Total Lymphocyte: 28.7 %
WBC: 6 10*3/uL (ref 3.8–10.8)
WBCMIX: 600 {cells}/uL (ref 200–950)

## 2017-04-12 LAB — PSA: PSA: 0.8 ng/mL (ref 0.10–4.00)

## 2017-04-12 LAB — LDL CHOLESTEROL, DIRECT: LDL DIRECT: 176 mg/dL

## 2017-04-12 MED ORDER — METFORMIN HCL ER 500 MG PO TB24: ORAL_TABLET | ORAL | 1 refills | Status: DC

## 2017-04-12 NOTE — Patient Instructions (Addendum)
Please head downstairs for lab work.  Please return in about 6 months for routine follow up.  Please work on exercise as we discussed. I'd like for you to start incorporating exercise into your daily schedule. Start at 10 minutes a day, working up to 30 minutes five times a week. Keep up the good work on eating a healthy diet!  It was nice to see you today. Thanks for letting me take care of you:)   Preventive Care 40-64 Years, Male Preventive care refers to lifestyle choices and visits with your health care provider that can promote health and wellness. What does preventive care include?  A yearly physical exam. This is also called an annual well check.  Dental exams once or twice a year.  Routine eye exams. Ask your health care provider how often you should have your eyes checked.  Personal lifestyle choices, including: ? Daily care of your teeth and gums. ? Regular physical activity. ? Eating a healthy diet. ? Avoiding tobacco and drug use. ? Limiting alcohol use. ? Practicing safe sex. ? Taking low-dose aspirin every day starting at age 58. What happens during an annual well check? The services and screenings done by your health care provider during your annual well check will depend on your age, overall health, lifestyle risk factors, and family history of disease. Counseling Your health care provider may ask you questions about your:  Alcohol use.  Tobacco use.  Drug use.  Emotional well-being.  Home and relationship well-being.  Sexual activity.  Eating habits.  Work and work Statistician.  Screening You may have the following tests or measurements:  Height, weight, and BMI.  Blood pressure.  Lipid and cholesterol levels. These may be checked every 5 years, or more frequently if you are over 86 years old.  Skin check.  Lung cancer screening. You may have this screening every year starting at age 31 if you have a 30-pack-year history of smoking and  currently smoke or have quit within the past 15 years.  Fecal occult blood test (FOBT) of the stool. You may have this test every year starting at age 12.  Flexible sigmoidoscopy or colonoscopy. You may have a sigmoidoscopy every 5 years or a colonoscopy every 10 years starting at age 70.  Prostate cancer screening. Recommendations will vary depending on your family history and other risks.  Hepatitis C blood test.  Hepatitis B blood test.  Sexually transmitted disease (STD) testing.  Diabetes screening. This is done by checking your blood sugar (glucose) after you have not eaten for a while (fasting). You may have this done every 1-3 years.  Discuss your test results, treatment options, and if necessary, the need for more tests with your health care provider. Vaccines Your health care provider may recommend certain vaccines, such as:  Influenza vaccine. This is recommended every year.  Tetanus, diphtheria, and acellular pertussis (Tdap, Td) vaccine. You may need a Td booster every 10 years.  Varicella vaccine. You may need this if you have not been vaccinated.  Zoster vaccine. You may need this after age 43.  Measles, mumps, and rubella (MMR) vaccine. You may need at least one dose of MMR if you were born in 1957 or later. You may also need a second dose.  Pneumococcal 13-valent conjugate (PCV13) vaccine. You may need this if you have certain conditions and have not been vaccinated.  Pneumococcal polysaccharide (PPSV23) vaccine. You may need one or two doses if you smoke cigarettes or if you  have certain conditions.  Meningococcal vaccine. You may need this if you have certain conditions.  Hepatitis A vaccine. You may need this if you have certain conditions or if you travel or work in places where you may be exposed to hepatitis A.  Hepatitis B vaccine. You may need this if you have certain conditions or if you travel or work in places where you may be exposed to hepatitis  B.  Haemophilus influenzae type b (Hib) vaccine. You may need this if you have certain risk factors.  Talk to your health care provider about which screenings and vaccines you need and how often you need them. This information is not intended to replace advice given to you by your health care provider. Make sure you discuss any questions you have with your health care provider. Document Released: 03/18/2015 Document Revised: 11/09/2015 Document Reviewed: 12/21/2014 Elsevier Interactive Patient Education  [employer].

## 2017-04-12 NOTE — Progress Notes (Signed)
Name: Austin Meadows   MRN: [id number]    DOB: 24-Aug-1960   Date:04/12/2017       Progress Note  Subjective  Chief Complaint  Chief Complaint  Patient presents with  . Establish Care    CPE refill of metformin    HPI  Patient presents for annual CPE.  USPSTF grade A and B recommendations:  Diet: Breakfast: spinach and eggs, Lunch: salad, grilled chicken, Dinner: soup, stew, casserole; Snacks- peanut butter crackers; Drinks-mostly water, occasionally soda Exercise: no routine exercise , was going to the gym but stopped  Depression: no concerns for anxiety or depression Depression screen [facility] 2/9 04/12/2017  Decreased Interest 0  Down, Depressed, Hopeless 0  PHQ - 2 Score 0   Hypertension: BP Readings from Last 3 Encounters:  04/12/17 130/80  02/05/17 (!) 142/82  02/04/17 140/82   Obesity: Wt Readings from Last 3 Encounters:  04/12/17 229 lb (103.9 kg)  02/05/17 231 lb (104.8 kg)  02/04/17 231 lb (104.8 kg)   BMI Readings from Last 3 Encounters:  04/12/17 31.06 kg/m  02/05/17 31.33 kg/m  02/04/17 31.33 kg/m    Lipids:  No results found for: CHOL No results found for: HDL No results found for: LDLCALC No results found for: TRIG No results found for: CHOLHDL No results found for: LDLDIRECT Glucose:  Glucose, Bld  Date Value Ref Range Status  02/04/2017 111 (H) 70 - 99 mg/dL Final  06/20/2016 102 (H) 70 - 99 mg/dL Final    Alcohol: social drink every few weeks  Tobacco use: NO, quit over 20 years ago  STD testing and prevention (chl/gon/syphilis):  Married, declines STD testing today HIV, hep C: up to date  Skin cancer: No concerning lesions or moles Colorectal cancer: colonoscopy up to date Prostate cancer: no history of prostate cancer or enlargment Lab Results  Component Value Date   PSA 0.98 06/09/2014   IPSS Questionnaire (AUA-7): Over the past month.   1)  How often have you had a sensation of not emptying your bladder completely after you finish  urinating?  0 - Not at all  2)  How often have you had to urinate again less than two hours after you finished urinating? 0 - Not at all  3)  How often have you found you stopped and started again several times when you urinated?  3- about half the time  4) How difficult have you found it to postpone urination?  0 - Not at all  5) How often have you had a weak urinary stream?  3 - About half the time  6) How often have you had to push or strain to begin urination?  0 - Not at all  7) How many times did you most typically get up to urinate from the time you went to bed until the time you got up in the morning?  1 - 1 time  Total score:  0-7 mildly symptomatic   8-19 moderately symptomatic-   20-35 severely symptomatic   Aspirin: not indicated ECG:  Not indicated  Vaccinations: up to date  Advanced Care Planning: A voluntary discussion about advance care planning including the explanation and discussion of advance directives.  Discussed health care proxy and Living will, and the patient Does not have a living will at present time. If patient does have living will, I have requested they bring this to the clinic to be scanned in to their chart.  Patient Active Problem List   Diagnosis Date Noted  .  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 Low back strain 03/19/2016  . CIDP (chronic inflammatory demyelinating polyneuropathy) ([place]) 04/11/2015  . Numbness and tingling 11/30/2014  . Type 2 diabetes mellitus ([place]) 06/24/2014  . Polyuria 06/09/2014  . Family history of colon cancer 06/09/2014  . Thumb pain 06/09/2014    Past Surgical History:  Procedure Laterality Date  . COLONOSCOPY  ages [age]   done at [facility] and [facility](pt unable to get records)    Family History  Problem Relation Age of Onset  . Arthritis Mother   . Cancer Mother        bone cancer  . Colon cancer Father 34  . Prostate cancer Father   . Heart disease Father   . Hypertension Father    . Diabetes Father   . Colon cancer Brother 24       late 43's when dx  . Cancer Brother        another cancer METS to brain  . Colon cancer Brother 60  . Colon cancer Other 58       Bill's daughter  . Healthy Son        x 4  . Healthy Daughter        x 1    Social History   Socioeconomic History  . Marital status: Married    Spouse name: Not on file  . Number of children: 5  . Years of education: 22  . Highest education level: Not on file  Social Needs  . Financial resource strain: Not on file  . Food insecurity - worry: Not on file  . Food insecurity - inability: Not on file  . Transportation needs - medical: Not on file  . Transportation needs - non-medical: Not on file  Occupational History  . Occupation: Diplomatic Services operational officer  Tobacco Use  . Smoking status: Former Smoker    Last attempt to quit: 06/04/1994    Years since quitting: 22.8  . Smokeless tobacco: Never Used  Substance and Sexual Activity  . Alcohol use: Yes    Alcohol/week: 0.0 oz    Comment: Occasionally (one drink per week).  Heavier drinking in the [employer] for ~ 8 years.   . Drug use: No  . Sexual activity: Not on file  Other Topics Concern  . Not on file  Social History Narrative   Currently has 5 children; wife has 3 children.  Lives with his wife; Fun: rides a motorcycle.   Denies any religious beliefs that would effect health care.    Lives in a 2 story home.     Works as a Diplomatic Services operational officer.  Education: associate degree.     Current Outpatient Medications:  .  Lancets MISC, Use to test blood sugars 2 times daily., Disp: 100 each, Rfl: 3 .  meloxicam (MOBIC) 7.5 MG tablet, Take 1 tablet (7.5 mg total) by mouth daily., Disp: 14 tablet, Rfl: 0 .  metFORMIN (GLUCOPHAGE-XR) 500 MG 24 hr tablet, Take one tablet by mouth once daily with. Needs office visit for more refills, Disp: 90 tablet, Rfl: 0  Allergies  Allergen Reactions  . Influenza Vaccines Other (See Comments)    Guillain barre  syndrome  . Penicillins Other (See Comments)    Pt can't remember reaction.     ROS Constitutional: Negative for fever or weight change.  Respiratory: Negative for cough and shortness of breath.   Cardiovascular: Negative for chest pain or palpitations.  Gastrointestinal: Negative for abdominal pain, no  bowel changes.  Musculoskeletal: Negative for gait problem or joint swelling.  Skin: Negative for rash.  Neurological: Negative for dizziness or headache.  No other specific complaints in a complete review of systems (except as listed in HPI above).   Objective  Vitals:   04/12/17 1054  BP: 130/80  Pulse: 76  Resp: 16  Temp: 98.4 F (36.9 C)  TempSrc: Oral  SpO2: 97%  Weight: 229 lb (103.9 kg)  Height: 6' (1.829 m)    Body mass index is 31.06 kg/m.  Physical Exam Vital signs reviewed Constitutional: Patient appears well-developed and well-nourished. No distress.  HENT: Head: Normocephalic and atraumatic. Ears: Bialteral TMs ok, no erythema or effusion; Nose: Nose normal. Mouth/Throat: Oropharynx is clear and moist. No oropharyngeal exudate.  Eyes: Conjunctivae and EOM are normal. Pupils are equal, round, and reactive to light. No scleral icterus.  Neck: Normal range of motion. Neck supple. No JVD present. No thyromegaly present.  Cardiovascular: Normal rate, regular rhythm and normal heart sounds.  No murmur heard. No BLE edema. Pulmonary/Chest: Effort normal and breath sounds normal. No respiratory distress. Abdominal: Soft. Bowel sounds are normal, no distension. There is no tenderness. no masses Musculoskeletal: Normal range of motion, no joint effusions. No gross deformities Neurological: he is alert and oriented to person, place, and time. No cranial nerve deficit. Coordination, balance, strength, speech and gait are normal.  Skin: Skin is warm and dry. No rash noted. No erythema.  Psychiatric: Patient has a normal mood and affect. behavior is normal. Judgment and  thought content normal.  PHQ2/9: Depression screen PHQ 2/9 04/12/2017  Decreased Interest 0  Down, Depressed, Hopeless 0  PHQ - 2 Score 0   Fall Risk: Fall Risk  04/12/2017 04/11/2015 01/17/2015  Falls in the past year? No No No   Assessment & Plan RTC in about 6 months for routine follow up-diabetes  Encounter for medication monitoring Annual med monitoring - CBC with Differential/Platelet; Future

## 2017-04-12 NOTE — Assessment & Plan Note (Signed)
-   Lipid panel; Future - metFORMIN (GLUCOPHAGE-XR) 500 MG 24 hr tablet; Take one tablet by mouth once daily with. Needs office visit for more refills  Dispense: 90 tablet; Refill: 1

## 2017-04-12 NOTE — Assessment & Plan Note (Signed)
-  Prostate cancer screening and PSA options (with potential risks and benefits of testing vs not testing) were discussed along with recent recs/guidelines. -USPSTF grade A and B recommendations reviewed with patient; age-appropriate recommendations, preventive care, screening tests, etc discussed and encouraged; healthy living encouraged; see AVS for patient education given to patient -Discussed importance of 150 minutes of physical activity weekly, eat two servings of fish weekly, eat one serving of tree nuts ( cashews, pistachios, pecans, almonds.[name] Kitchen) every other day, eat 6 servings of fruit/vegetables daily and drink plenty of water and avoid sweet beverages.  -Reviewed Health Maintenance: he plans to schedule annual eye exam on his own, otherwise up to date 1. Screening for prostate cancer - PSA; Future

## 2017-04-15 ENCOUNTER — Other Ambulatory Visit: Payer: Self-pay | Admitting: Nurse Practitioner

## 2017-04-15 DIAGNOSIS — E782 Mixed hyperlipidemia: Secondary | ICD-10-CM

## 2017-04-15 MED ORDER — ATORVASTATIN CALCIUM 20 MG PO TABS: 20.0000 mg | ORAL_TABLET | Freq: Every day | ORAL | 2 refills | Status: DC

## 2017-05-02 ENCOUNTER — Ambulatory Visit: Payer: Self-pay

## 2017-05-02 NOTE — Telephone Encounter (Signed)
Phone call from pt.  Reported he has had change in bowel pattern since starting Atorvastatin.  Stated that he normally has daily BM without difficulty.  Reported he started the Atorvastatin about 8 days ago, and has only been able to have 2-3 BM's over the past week.  Stated the Atorvastatin is the only change in his medication.  Reported his diet is rich with vegetables; stated he eats more vegetables than meats.  Feels he drinks about 48+ oz. of H20 today.  Only drinks one cup of coffee/ day, and rarely drinks sodas.  Reports has a job that he is very physically active in, and typically exercises regularly.  Did report his exercise routine has not been consistent just in past 2 weeks.  Reviewed home care suggestions per protocol.  Will make PCP aware of his concern with constipation, since starting the Atorvastatin. Encourage to call back if symptoms worsen.  Verb. Understanding and agrees with plan.      Reason for Disposition . Treating constipation with Over-The-Counter (OTC) medicines, questions about  Answer Assessment - Initial Assessment Questions 1. STOOL PATTERN OR FREQUENCY: "How often do you pass bowel movements (BMs)?"  (Normal range: tid to q 3 days)  "When was the last BM passed?"       Usually goes daily 2. STRAINING: "Do you have to strain to have a BM?"      Some straining at this time, which is unusual 3. RECTAL PAIN: "Does your rectum hurt when the stool comes out?" If so, ask: "Do you have hemorrhoids? How bad is the pain?"  (Scale 1-10; or mild, moderate, severe)     Some rectal pain when he starts to have stool, then eases off 4. STOOL COMPOSITION: "Are the stools hard?"      Consistency is hard 5. BLOOD ON STOOLS: "Has there been any blood on the toilet tissue or on the surface of the BM?" If so, ask: "When was the last time?"      No bleeding 6. CHRONIC CONSTIPATION: "Is this a new problem for you?"  If no, ask: How long have you had this problem?" (days, weeks, months)   New problem since started the statin 7. CHANGES IN DIET: "Have there been any recent changes in your diet?"      No new changes in diet ; usually eats more vegetables than meat  8. MEDICATIONS: "Have you been taking any new medications?"     Only Atorvastatin 9. LAXATIVES: "Have you been using any laxatives or enemas?"  If yes, ask "What, how often, and when was the last time?"     No 10. CAUSE: "What do you think is causing the constipation?"        New medication - statin 11. OTHER SYMPTOMS: "Do you have any other symptoms?" (e.g., abdominal pain, fever, vomiting)       Denies abdominal pain, bloating, nausea or vomiting.  12. PREGNANCY: "Is there any chance you are pregnant?" "When was your last menstrual period?"       n/a  Protocols used: CONSTIPATION-A-AH

## 2017-05-09 NOTE — Telephone Encounter (Signed)
Can we call him to see how his constipation is doing? If he is still feeling constipated, I would like for him to stop the atorvastatin to see if this might be the cause

## 2017-05-09 NOTE — Telephone Encounter (Signed)
LVM with the response below.

## 2017-06-12 ENCOUNTER — Other Ambulatory Visit: Payer: BLUE CROSS/BLUE SHIELD

## 2017-07-29 ENCOUNTER — Other Ambulatory Visit: Payer: Self-pay | Admitting: Nurse Practitioner

## 2017-07-29 DIAGNOSIS — E782 Mixed hyperlipidemia: Secondary | ICD-10-CM

## 2017-07-30 ENCOUNTER — Telehealth: Payer: Self-pay | Admitting: Nurse Practitioner

## 2017-07-30 NOTE — Telephone Encounter (Signed)
Copied from [place] [id number]. Topic: Quick Communication - See Telephone Encounter >> Jul 30, 2017  4:10 PM Arletha Grippe wrote: CRM for notification. See Telephone encounter for: 07/30/17. Pt is requesting rx duexis  [facility]  Cb is [phone] Not on med list

## 2017-07-31 NOTE — Telephone Encounter (Signed)
Patient requesting refill for Duexis. This medication was last refilled/ ordered by Dr. Elna Breslow on 06/20/16 and is currently not on his medication profile.  LOV 04/22/17 PCP Caesar Chestnut, NP This medication was mentioned but not prescribed by her at the time, related to left shoulder pain.  Pharmacy [facility] Routing to office for consideration.

## 2017-08-01 NOTE — Telephone Encounter (Signed)
Pls advise if ok to send,.../lmb

## 2017-08-02 MED ORDER — IBUPROFEN-FAMOTIDINE 800-26.6 MG PO TABS: 1.0000 | ORAL_TABLET | Freq: Three times a day (TID) | ORAL | 0 refills | Status: DC

## 2017-08-02 NOTE — Telephone Encounter (Signed)
Notified pt w/Ashleigh response.[name] KitchenJohny Meadows

## 2017-08-02 NOTE — Telephone Encounter (Signed)
Sent one refill to [facility]. Please make sure he is taking this on as needed schedule only and follow up for OV for worsening pain

## 2017-11-05 ENCOUNTER — Other Ambulatory Visit: Payer: Self-pay | Admitting: Nurse Practitioner

## 2017-11-10 ENCOUNTER — Other Ambulatory Visit: Payer: Self-pay | Admitting: Nurse Practitioner

## 2017-11-10 DIAGNOSIS — E782 Mixed hyperlipidemia: Secondary | ICD-10-CM

## 2017-11-10 DIAGNOSIS — E119 Type 2 diabetes mellitus without complications: Secondary | ICD-10-CM

## 2017-12-02 ENCOUNTER — Telehealth: Payer: Self-pay | Admitting: Nurse Practitioner

## 2017-12-02 NOTE — Telephone Encounter (Signed)
Pt informed of below. He would like something similar to replace Duexis. He states he has acid reflux and heartburn in the evening at times when he eats pizza or spaghetti and he uses Prilosec for this. Please advise.

## 2017-12-02 NOTE — Telephone Encounter (Signed)
Received notification that his insurance will no longer pay for duexis. Does he need something in place?

## 2017-12-04 NOTE — Telephone Encounter (Signed)
Since he is having symptoms, I would like for him to come in for OV so we can discuss and come up with best treatment plan for him.

## 2017-12-05 MED ORDER — OMEPRAZOLE 40 MG PO CPDR: 40.0000 mg | DELAYED_RELEASE_CAPSULE | Freq: Every day | ORAL | 0 refills | Status: DC

## 2017-12-05 MED ORDER — MELOXICAM 15 MG PO TABS: 15.0000 mg | ORAL_TABLET | Freq: Every day | ORAL | 0 refills | Status: DC | PRN

## 2017-12-05 NOTE — Telephone Encounter (Signed)
I have sent a prescription for mobic, antiinflammatory, and prilosec for acid. He is overdue for diabetes follow up, please schedule follow up visit

## 2017-12-05 NOTE — Telephone Encounter (Addendum)
Pt informed of below. OV scheduled for 12/09/17 @ 9:30. Pt aware.

## 2017-12-05 NOTE — Addendum Note (Signed)
Addended by: Lance Sell on: 12/05/2017 10:03 AM   Modules accepted: Orders

## 2017-12-05 NOTE — Telephone Encounter (Signed)
He is not having new sxs. He just wants a replacement for Duexis.

## 2017-12-12 ENCOUNTER — Encounter: Payer: Self-pay | Admitting: Nurse Practitioner

## 2017-12-12 ENCOUNTER — Ambulatory Visit: Payer: BLUE CROSS/BLUE SHIELD | Admitting: Nurse Practitioner

## 2017-12-12 ENCOUNTER — Other Ambulatory Visit (INDEPENDENT_AMBULATORY_CARE_PROVIDER_SITE_OTHER): Payer: BLUE CROSS/BLUE SHIELD

## 2017-12-12 VITALS — BP 132/80 | HR 83 | Ht 72.0 in | Wt 230.0 lb

## 2017-12-12 DIAGNOSIS — E119 Type 2 diabetes mellitus without complications: Secondary | ICD-10-CM | POA: Diagnosis not present

## 2017-12-12 DIAGNOSIS — N529 Male erectile dysfunction, unspecified: Secondary | ICD-10-CM

## 2017-12-12 DIAGNOSIS — E7800 Pure hypercholesterolemia, unspecified: Secondary | ICD-10-CM

## 2017-12-12 DIAGNOSIS — E785 Hyperlipidemia, unspecified: Secondary | ICD-10-CM | POA: Insufficient documentation

## 2017-12-12 LAB — COMPREHENSIVE METABOLIC PANEL
ALT: 33 U/L (ref 0–53)
AST: 19 U/L (ref 0–37)
Albumin: 4.4 g/dL (ref 3.5–5.2)
Alkaline Phosphatase: 60 U/L (ref 39–117)
BUN: 20 mg/dL (ref 6–23)
CALCIUM: 10 mg/dL (ref 8.4–10.5)
CHLORIDE: 100 meq/L (ref 96–112)
CO2: 30 mEq/L (ref 19–32)
Creatinine, Ser: 0.93 mg/dL (ref 0.40–1.50)
GFR: 108.42 mL/min (ref >60.00)
GLUCOSE: 165 mg/dL — AB (ref 70–99)
Potassium: 4.5 mEq/L (ref 3.5–5.1)
Sodium: 138 mEq/L (ref 135–145)
Total Bilirubin: 0.3 mg/dL (ref 0.2–1.2)
Total Protein: 7.7 g/dL (ref 6.0–8.3)

## 2017-12-12 LAB — LIPID PANEL
Cholesterol: 194 mg/dL (ref 0–200)
HDL: 48.8 mg/dL (ref >39.00)
LDL CALC: 106 mg/dL — AB (ref 0–99)
NONHDL: 145.07
TRIGLYCERIDES: 194 mg/dL — AB (ref 0.0–149.0)
Total CHOL/HDL Ratio: 4
VLDL: 38.8 mg/dL (ref 0.0–40.0)

## 2017-12-12 LAB — MICROALBUMIN / CREATININE URINE RATIO
Creatinine,U: 90.5 mg/dL
MICROALB/CREAT RATIO: 0.8 mg/g (ref 0.0–30.0)

## 2017-12-12 LAB — CBC
HEMATOCRIT: 44.4 % (ref 39.0–52.0)
HEMOGLOBIN: 14.8 g/dL (ref 13.0–17.0)
MCHC: 33.3 g/dL (ref 30.0–36.0)
MCV: 83.2 fl (ref 78.0–100.0)
PLATELETS: 231 10*3/uL (ref 150.0–400.0)
RBC: 5.34 Mil/uL (ref 4.22–5.81)
RDW: 13.9 % (ref 11.5–15.5)
WBC: 6.1 10*3/uL (ref 4.0–10.5)

## 2017-12-12 LAB — TSH: TSH: 1.41 u[IU]/mL (ref 0.35–4.50)

## 2017-12-12 LAB — TESTOSTERONE: TESTOSTERONE: 199.76 ng/dL — AB (ref 300.00–890.00)

## 2017-12-12 LAB — HEMOGLOBIN A1C: Hgb A1c MFr Bld: 7.7 % — ABNORMAL HIGH (ref 4.6–6.5)

## 2017-12-12 NOTE — Progress Notes (Signed)
Austin Meadows is a 57 y.o. male with the following history as recorded in EpicCare:  Patient Active Problem List   Diagnosis Date Noted  . Routine general medical examination at a health care facility 04/12/2017  . 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 CIDP (chronic inflammatory demyelinating polyneuropathy) ([place]) 04/11/2015  . Type 2 diabetes mellitus ([place]) 06/24/2014  . Family history of colon cancer 06/09/2014    Current Outpatient Medications  Medication Sig Dispense Refill  . atorvastatin (LIPITOR) 20 MG tablet TAKE 1 TABLET BY MOUTH ONCE DAILY 90 tablet 0  . DUEXIS 800-26.6 MG TABS Take 1 tablet by mouth 3 (three) times daily. 90 tablet 1  . Lancets MISC Use to test blood sugars 2 times daily. 100 each 3  . meloxicam (MOBIC) 15 MG tablet Take 1 tablet (15 mg total) by mouth daily as needed for pain. 30 tablet 0  . metFORMIN (GLUCOPHAGE-XR) 500 MG 24 hr tablet TAKE 1 TABLET BY MOUTH ONCE DAILY -  NEEDS  OFFICE  VISIT  FOR  MORE  REFILLS 90 tablet 0  . omeprazole (PRILOSEC) 40 MG capsule Take 1 capsule (40 mg total) by mouth daily. (Patient taking differently: Take 40 mg by mouth as needed. ) 30 capsule 0   No current facility-administered medications for this visit.     Allergies: Influenza vaccines and Penicillins  Past Medical History:  Diagnosis Date  . Chicken pox   . Diabetes mellitus without complication ([place])    type 2  . Hemorrhoid   . Hyperlipidemia   . Jaundice of newborn   . Rheumatic fever   . UTI (lower urinary tract infection)     Past Surgical History:  Procedure Laterality Date  . COLONOSCOPY  ages [age]   done at [facility] and [facility](pt unable to get records)    Family History  Problem Relation Age of Onset  . Arthritis Mother   . Cancer Mother        bone cancer  . Colon cancer Father 88  . Prostate cancer Father   . Heart disease Father   . Hypertension Father   . Diabetes Father   . Colon cancer Brother 26        late 71's when dx  . Cancer Brother        another cancer METS to brain  . Colon cancer Brother 64  . Colon cancer Other 61       Bill's daughter  . Healthy Son        x 4  . Healthy Daughter        x 1    Social History   Tobacco Use  . Smoking status: Former Smoker    Last attempt to quit: 06/04/1994    Years since quitting: 23.5  . Smokeless tobacco: Never Used  Substance Use Topics  . Alcohol use: Yes    Alcohol/week: 0.0 standard drinks    Comment: Occasionally (one drink per week).  Heavier drinking in the [employer] for ~ 8 years.      Subjective:  Austin Meadows is here today for regular diabetes follow up, will follow up on cholesterol and discuss complaint of ED as well.  Diabetes- maintained on metformin 500 daily Reports daily medication compliance without adverse medication effects. Reports sometimes he checks glucose at home, recent readings are around 90s  Denies tremor, diaphoresis, polyuria, polydipsia, polyphagia.  Lab Results  Component Value Date   HGBA1C 6.7 (H) 02/04/2017   HLD-  maintained on atorvastatin 20 daily, which started after his last lipid panel showed elevated cholesterol levels at CPE in February, he  Did not return for repeat labs. He has been taking the atorvastatin daily as directed without noted adverse effects, initially had some constipation when he first started the statin but has improved  Lab Results  Component Value Date   CHOL 248 (H) 04/12/2017   HDL 44.10 04/12/2017   LDLDIRECT 176.0 04/12/2017   TRIG 242.0 (H) 04/12/2017   CHOLHDL 6 04/12/2017   ED- this is not a new problem, has noticed for some time, years maybe, but has been worse over past year or so. No past evaluation for ED, asking to have his testosterone level checked today No fevers, chills, abdominal pain, nausea, vomiting, dysuria, hematuria, urinary hesitancy, rectal bleeding  ROS- See HPI  Objective:  Vitals:   12/12/17 0919  BP: 132/80  Pulse: 83  SpO2: 96%   Weight: 230 lb (104.3 kg)  Height: 6' (1.829 m)    General: Well developed, well nourished, in no acute distress  Skin : Warm and dry.  Head: Normocephalic and atraumatic  Eyes: Sclera and conjunctiva clear; pupils round and reactive to light; extraocular movements intact  Oropharynx: Pink, moist, supple.  Neck: Supple Lungs: Respirations unlabored; clear to auscultation bilaterally without wheeze, rales, rhonchi  CVS exam: normal rate, regular rhythm, normal S1, S2, no murmurs, rubs, clicks or gallops.   Extremities: No edema, cyanosis  Vessels: Symmetric bilaterally  Neurologic: Alert and oriented; speech intact; face symmetrical; moves all extremities well; CNII-XII intact without focal deficit    Assessment:  1. Type 2 diabetes mellitus without complication, without long-term current use of insulin ([place])   2. Hypercholesteremia   3. Erectile dysfunction, unspecified erectile dysfunction type     Plan:   Return in about 6 months (around 06/13/2018) for CPE, DM follow up.  Orders Placed This Encounter  Procedures  . Hemoglobin A1c    Standing Status:   Future    Number of Occurrences:   1    Standing Expiration Date:   12/13/2018  . CBC    Standing Status:   Future    Number of Occurrences:   1    Standing Expiration Date:   12/13/2018  . Microalbumin / creatinine urine ratio    Standing Status:   Future    Number of Occurrences:   1    Standing Expiration Date:   12/13/2018  . Testosterone    Standing Status:   Future    Number of Occurrences:   1    Standing Expiration Date:   12/12/2018  . TSH    Standing Status:   Future    Number of Occurrences:   1    Standing Expiration Date:   12/13/2018  . Ambulatory referral to Nutrition and Diabetic Education    Referral Priority:   Routine    Referral Type:   Consultation    Referral Reason:   Specialty Services Required    Number of Visits Requested:   1    Requested Prescriptions    No prescriptions requested or  ordered in this encounter

## 2017-12-12 NOTE — Assessment & Plan Note (Signed)
We discussed the multifactorial etiology of ED and that his chronic medical conditions and daily medications could contribute to this problem. Return precautions and additional information printed on AVS Encouraged to work on healthy diet and exercise  Labs today PSA WNl 04/12/17 F/U with further recommendations pending lab results, could trial viagra if labs are all normal - CBC; Future - Testosterone; Future - TSH; Future

## 2017-12-12 NOTE — Patient Instructions (Addendum)
Please head downstairs for lab work If any of your test results are critically abnormal, you will be contacted right away. Otherwise, I will contact you as soon as possible with any changes or recommendations.   Erectile Dysfunction Erectile dysfunction (ED) is the inability to get or keep an erection in order to have sexual intercourse. Erectile dysfunction may include:  Inability to get an erection.  Lack of enough hardness of the erection to allow penetration.  Loss of the erection before sex is finished.  What are the causes? This condition may be caused by:  Certain medicines, such as: ? Pain relievers. ? Antihistamines. ? Antidepressants. ? Blood pressure medicines. ? Water pills (diuretics). ? Ulcer medicines. ? Muscle relaxants. ? Drugs.  Excessive drinking.  Psychological causes, such as: ? Anxiety. ? Depression. ? Sadness. ? Exhaustion. ? Performance fear. ? Stress.  Physical causes, such as: ? Artery problems. This may include diabetes, smoking, liver disease, or atherosclerosis. ? High blood pressure. ? Hormonal problems, such as low testosterone. ? Obesity. ? Nerve problems. This may include back or pelvic injuries, diabetes mellitus, multiple sclerosis, or Parkinson disease.  What are the signs or symptoms? Symptoms of this condition include:  Inability to get an erection.  Lack of enough hardness of the erection to allow penetration.  Loss of the erection before sex is finished.  Normal erections at some times, but with frequent unsatisfactory episodes.  Low sexual satisfaction in either partner due to erection problems.  A curved penis occurring with erection. The curve may cause pain or the penis may be too curved to allow for intercourse.  Never having nighttime erections.  How is this diagnosed? This condition is often diagnosed by:  Performing a physical exam to find other diseases or specific problems with the penis.  Asking you  detailed questions about the problem.  Performing blood tests to check for diabetes mellitus or to measure hormone levels.  Performing other tests to check for underlying health conditions.  Performing an ultrasound exam to check for scarring.  Performing a test to check blood flow to the penis.  Doing a sleep study at home to measure nighttime erections.  How is this treated? This condition may be treated by:  Medicine taken by mouth to help you achieve an erection (oral medicine).  Hormone replacement therapy to replace low testosterone levels.  Medicine that is injected into the penis. Your health care provider may instruct you how to give yourself these injections at home.  Vacuum pump. This is a pump with a ring on it. The pump and ring are placed on the penis and used to create pressure that helps the penis become erect.  Penile implant surgery. In this procedure, you may receive: ? An inflatable implant. This consists of cylinders, a pump, and a reservoir. The cylinders can be inflated with a fluid that helps to create an erection, and they can be deflated after intercourse. ? A semi-rigid implant. This consists of two silicone rubber rods. The rods provide some rigidity. They are also flexible, so the penis can both curve downward in its normal position and become straight for sexual intercourse.  Blood vessel surgery, to improve blood flow to the penis. During this procedure, a blood vessel from a different part of the body is placed into the penis to allow blood to flow around (bypass) damaged or blocked blood vessels.  Lifestyle changes, such as exercising more, losing weight, and quitting smoking.  Follow these instructions at home:  Medicines  Take over-the-counter and prescription medicines only as told by your health care provider. Do not increase the dosage without first discussing it with your health care provider.  If you are using self-injections, perform  injections as directed by your health care provider. Make sure to avoid any veins that are on the surface of the penis. After giving an injection, apply pressure to the injection site for 5 minutes. General instructions  Exercise regularly, as directed by your health care provider. Work with your health care provider to lose weight, if needed.  Do not use any products that contain nicotine or tobacco, such as cigarettes and e-cigarettes. If you need help quitting, ask your health care provider.  Before using a vacuum pump, read the instructions that come with the pump and discuss any questions with your health care provider.  Keep all follow-up visits as told by your health care provider. This is important. Contact a health care provider if:  You feel nauseous.  You vomit. Get help right away if:  You are taking oral or injectable medicines and you have an erection that lasts longer than 4 hours. If your health care provider is unavailable, go to the nearest emergency room for evaluation. An erection that lasts much longer than 4 hours can result in permanent damage to your penis.  You have severe pain in your groin or abdomen.  You develop redness or severe swelling of your penis.  You have redness spreading up into your groin or lower abdomen.  You are unable to urinate.  You experience chest pain or a rapid heart beat (palpitations) after taking oral medicines. Summary  Erectile dysfunction (ED) is the inability to get or keep an erection during sexual intercourse. This problem can usually be treated successfully.  This condition is diagnosed based on a physical exam, your symptoms, and tests to determine the cause. Treatment varies depending on the cause, and may include medicines, hormone therapy, surgery, or vacuum pump.  You may need follow-up visits to make sure that you are using your medicines or devices correctly.  Get help right away if you are taking or injecting  medicines and you have an erection that lasts longer than 4 hours. This information is not intended to replace advice given to you by your health care provider. Make sure you discuss any questions you have with your health care provider. Document Released: 02/17/2000 Document Revised: 03/07/2016 Document Reviewed: 03/07/2016 Elsevier Interactive Patient Education  2017 [employer].

## 2017-12-12 NOTE — Assessment & Plan Note (Signed)
Continue metformin at current dosage Update labs Recommend annual eye exam with ophthalmology Agreeable to NTR referral - Hemoglobin A1c; Future - CBC; Future - Microalbumin / creatinine urine ratio; Future - Ambulatory referral to Nutrition and Diabetic Education

## 2017-12-12 NOTE — Assessment & Plan Note (Signed)
Update lipid panel, CMET- orders have already been placed Banana and glass of OJ this am Agreeable to NTR referral - Ambulatory referral to Nutrition and Diabetic Education

## 2017-12-13 ENCOUNTER — Other Ambulatory Visit: Payer: Self-pay | Admitting: Nurse Practitioner

## 2017-12-13 DIAGNOSIS — R7989 Other specified abnormal findings of blood chemistry: Secondary | ICD-10-CM

## 2017-12-13 DIAGNOSIS — E119 Type 2 diabetes mellitus without complications: Secondary | ICD-10-CM

## 2017-12-13 DIAGNOSIS — N529 Male erectile dysfunction, unspecified: Secondary | ICD-10-CM

## 2017-12-13 MED ORDER — METFORMIN HCL ER 500 MG PO TB24: 500.0000 mg | ORAL_TABLET | Freq: Two times a day (BID) | ORAL | 2 refills | Status: DC

## 2017-12-19 ENCOUNTER — Encounter: Payer: Self-pay | Admitting: Nurse Practitioner

## 2018-01-02 ENCOUNTER — Encounter: Payer: Self-pay | Admitting: Nurse Practitioner

## 2018-01-28 DIAGNOSIS — N5201 Erectile dysfunction due to arterial insufficiency: Secondary | ICD-10-CM | POA: Diagnosis not present

## 2018-02-24 ENCOUNTER — Other Ambulatory Visit: Payer: Self-pay | Admitting: Nurse Practitioner

## 2018-02-24 DIAGNOSIS — E782 Mixed hyperlipidemia: Secondary | ICD-10-CM

## 2018-02-24 MED ORDER — ATORVASTATIN CALCIUM 20 MG PO TABS: 20.0000 mg | ORAL_TABLET | Freq: Every day | ORAL | 1 refills | Status: DC

## 2018-02-24 NOTE — Telephone Encounter (Signed)
Requested medication (s) are due for refill today: yes  Requested medication (s) are on the active medication list: yes  Last refill:  11/11/17 for 90 tabs  Future visit scheduled: yes  Notes to clinic:  antilipid - statins failed  Requested Prescriptions  Pending Prescriptions Disp Refills   atorvastatin (LIPITOR) 20 MG tablet 90 tablet 0    Sig: Take 1 tablet (20 mg total) by mouth daily.     Cardiovascular:  Antilipid - Statins Failed - 02/24/2018 12:17 PM      Failed - LDL in normal range and within 360 days    LDL Cholesterol  Date Value Ref Range Status  12/12/2017 106 (H) 0 - 99 mg/dL Final         Failed - Triglycerides in normal range and within 360 days    Triglycerides  Date Value Ref Range Status  12/12/2017 194.0 (H) 0.0 - 149.0 mg/dL Final    Comment:    Normal:  <150 mg/dLBorderline High:  150 - 199 mg/dL         Passed - Total Cholesterol in normal range and within 360 days    Cholesterol  Date Value Ref Range Status  12/12/2017 194 0 - 200 mg/dL Final    Comment:    ATP III Classification       Desirable:  < 200 mg/dL               Borderline High:  200 - 239 mg/dL          High:  > = 240 mg/dL         Passed - HDL in normal range and within 360 days    HDL  Date Value Ref Range Status  12/12/2017 48.80 >39.00 mg/dL Final         Passed - Patient is not pregnant      Passed - Valid encounter within last 12 months    Recent Outpatient Visits          2 months ago Type 2 diabetes mellitus without complication, without long-term current use of insulin ([place])   [place], Ashleigh N, NP   10 months ago Routine general medical examination at a health care facility   [place], Delphia Grates, NP   1 year ago Adhesive capsulitis of left shoulder   [place], Enid Baas, MD   1 year ago Acute pain of left shoulder   [place], Delphia Grates, NP   1 year ago Type 2 diabetes mellitus without complication, without long-term current use of insulin ([place])   [place], FNP      Future Appointments            In 3 weeks [place], Delphia Grates, NP [place], [place]

## 2018-02-24 NOTE — Telephone Encounter (Signed)
Copied from [place] [id number]. Topic: Quick Communication - Rx Refill/Question >> Feb 24, 2018 11:20 AM Virl Axe D wrote: Medication: atorvastatin (LIPITOR) 20 MG tablet   Has the patient contacted their pharmacy? Yes.   (Agent: If no, request that the patient contact the pharmacy for the refill.) (Agent: If yes, when and what did the pharmacy advise?)  Preferred Pharmacy (with phone number or street name): [facility] ([address]), [place] - [place] [phone] (Phone) [phone] (Fax)    Agent: Please be advised that RX refills may take up to 3 business days. We ask that you follow-up with your pharmacy.

## 2018-03-21 ENCOUNTER — Encounter: Payer: Self-pay | Admitting: Nurse Practitioner

## 2018-03-21 ENCOUNTER — Ambulatory Visit (INDEPENDENT_AMBULATORY_CARE_PROVIDER_SITE_OTHER): Payer: BLUE CROSS/BLUE SHIELD | Admitting: Nurse Practitioner

## 2018-03-21 VITALS — BP 140/80 | HR 77 | Ht 72.0 in | Wt 230.0 lb

## 2018-03-21 DIAGNOSIS — E119 Type 2 diabetes mellitus without complications: Secondary | ICD-10-CM

## 2018-03-21 LAB — POCT GLYCOSYLATED HEMOGLOBIN (HGB A1C): Hemoglobin A1C: 7 % — AB (ref 4.0–5.6)

## 2018-03-21 NOTE — Assessment & Plan Note (Signed)
A1c has improved Continue metformin at current dosage Referred him to dietician on 10/10 but he never scheduled appointment RTC in about 4 months for annual CPE, diabetes follow up Lab Results  Component Value Date   HGBA1C 7.0 (A) 03/21/2018

## 2018-03-21 NOTE — Progress Notes (Signed)
Austin Meadows is a 58 y.o. male with the following history as recorded in EpicCare:  Patient Active Problem List   Diagnosis Date Noted  . Erectile dysfunction 12/12/2017  . Hypercholesteremia 12/12/2017  . Routine general medical examination at a health care facility 04/12/2017  . 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 CIDP (chronic inflammatory demyelinating polyneuropathy) ([place]) 04/11/2015  . Type 2 diabetes mellitus ([place]) 06/24/2014  . Family history of colon cancer 06/09/2014    Current Outpatient Medications  Medication Sig Dispense Refill  . atorvastatin (LIPITOR) 20 MG tablet Take 1 tablet (20 mg total) by mouth daily. 90 tablet 1  . DUEXIS 800-26.6 MG TABS Take 1 tablet by mouth 3 (three) times daily. 90 tablet 1  . Lancets MISC Use to test blood sugars 2 times daily. 100 each 3  . meloxicam (MOBIC) 15 MG tablet Take 1 tablet (15 mg total) by mouth daily as needed for pain. 30 tablet 0  . metFORMIN (GLUCOPHAGE-XR) 500 MG 24 hr tablet Take 1 tablet (500 mg total) by mouth 2 (two) times daily. 180 tablet 2  . omeprazole (PRILOSEC) 40 MG capsule Take 1 capsule (40 mg total) by mouth daily. (Patient taking differently: Take 40 mg by mouth as needed. ) 30 capsule 0   No current facility-administered medications for this visit.     Allergies: Influenza vaccines and Penicillins  Past Medical History:  Diagnosis Date  . Chicken pox   . Diabetes mellitus without complication ([place])    type 2  . Hemorrhoid   . Hyperlipidemia   . Jaundice of newborn   . Rheumatic fever   . UTI (lower urinary tract infection)     Past Surgical History:  Procedure Laterality Date  . COLONOSCOPY  ages [age]   done at [facility](pt unable to get records)    Family History  Problem Relation Age of Onset  . Arthritis Mother   . Cancer Mother        bone cancer  . Colon cancer Father 23  . Prostate cancer Father   . Heart disease Father   . Hypertension  Father   . Diabetes Father   . Colon cancer Brother 51       late 48's when dx  . Cancer Brother        another cancer METS to brain  . Colon cancer Brother 61  . Colon cancer Other 62       Bill's daughter  . Healthy Son        x 4  . Healthy Daughter        x 1    Social History   Tobacco Use  . Smoking status: Former Smoker    Last attempt to quit: 06/04/1994    Years since quitting: 23.8  . Smokeless tobacco: Never Used  Substance Use Topics  . Alcohol use: Yes    Alcohol/week: 0.0 standard drinks    Comment: Occasionally (one drink per week).  Heavier drinking in the [employer] for ~ 8 years.      Subjective:  Austin Meadows is here today for F/U of Diabetes- maintained on metformin, which was increased to 500mg  BID at last OV on 12/12/17 due to elevated A1c Reports tolerating medication adjustment well, no noted adverse effects.  Denies hypoglycemia, tremor, diaphoresis, polyuria, polydipsia, polyphagia, says he has been feeling well.  Lab Results  Component Value Date   HGBA1C 7.7 (H) 12/12/2017    ROS- See HPI  Objective:  Vitals:   03/21/18 0900  BP: 140/80  Pulse: 77  SpO2: 96%  Weight: 230 lb (104.3 kg)  Height: 6' (1.829 m)    General: Well developed, well nourished, in no acute distress  Skin : Warm and dry.  Head: Normocephalic and atraumatic  Eyes: Sclera and conjunctiva clear; pupils round and reactive to light; extraocular movements intact  Oropharynx: Pink, supple. No suspicious lesions  Neck: Supple without thyromegaly, adenopathy  Lungs: Respirations unlabored; clear to auscultation bilaterally without wheeze, rales, rhonchi  CVS exam: normal rate, regular rhythm, normal S1, S2, no murmurs, rubs, clicks or gallops.   Extremities: No edema, cyanosis, clubbing  Vessels: Symmetric bilaterally  Neurologic: Alert and oriented; speech intact; face symmetrical; moves all extremities well; CNII-XII intact without focal deficit  Psychiatric: Normal mood and  affect.   Assessment:  1. Type 2 diabetes mellitus without complication, without long-term current use of insulin ([place])     Plan:   No follow-ups on file.  Orders Placed This Encounter  Procedures  . POCT HgB A1C    Requested Prescriptions    No prescriptions requested or ordered in this encounter

## 2018-03-21 NOTE — Patient Instructions (Addendum)
Please return in about 3-4 months for annual physical with fasting lab work.  Good job on A1c!!!

## 2018-04-28 ENCOUNTER — Other Ambulatory Visit: Payer: Self-pay | Admitting: Nurse Practitioner

## 2018-04-28 MED ORDER — MELOXICAM 15 MG PO TABS: 15.0000 mg | ORAL_TABLET | Freq: Every day | ORAL | 2 refills | Status: DC | PRN

## 2018-04-28 NOTE — Telephone Encounter (Signed)
Copied from [place] [id number]. Topic: Quick Communication - Rx Refill/Question >> Apr 28, 2018 10:34 AM Alfredia Ferguson R wrote: Medication: meloxicam (MOBIC) 15 MG tablet  Has the patient contacted their pharmacy? Yes  Preferred Pharmacy (with phone number or street name): [facility] ([address] [place] - [place] [phone] (Phone) [phone] (Fax)    Agent: Please be advised that RX refills may take up to 3 business days. We ask that you follow-up with your pharmacy.

## 2018-06-02 ENCOUNTER — Encounter: Payer: BLUE CROSS/BLUE SHIELD | Admitting: Nurse Practitioner

## 2018-06-04 ENCOUNTER — Encounter: Payer: BLUE CROSS/BLUE SHIELD | Admitting: Nurse Practitioner

## 2018-06-26 ENCOUNTER — Other Ambulatory Visit: Payer: Self-pay | Admitting: Nurse Practitioner

## 2018-06-26 MED ORDER — OMEPRAZOLE 40 MG PO CPDR: 40.0000 mg | DELAYED_RELEASE_CAPSULE | Freq: Every day | ORAL | 0 refills | Status: DC

## 2018-06-26 MED ORDER — MELOXICAM 15 MG PO TABS: 15.0000 mg | ORAL_TABLET | Freq: Every day | ORAL | 0 refills | Status: DC | PRN

## 2018-06-26 NOTE — Telephone Encounter (Signed)
Requested medication (s) are due for refill today: omeprazole yes  Requested medication (s) are on the active medication list: yes  Last refill:  12/05/17  Future visit scheduled: no  Notes to clinic: previously seen by Caesar Chestnut  Requested medication (s) are due for refill today: meloxicam yes  Requested medication (s) are on the active medication list: yes  Last refill:  04/28/2018  Future visit scheduled: no  Notes to clinic:  previously seen by Caesar Chestnut   Requested Prescriptions  Pending Prescriptions Disp Refills   omeprazole (PRILOSEC) 40 MG capsule 30 capsule 0    Sig: Take 1 capsule (40 mg total) by mouth daily.     Gastroenterology: Proton Pump Inhibitors Passed - 06/26/2018 10:55 AM      Passed - Valid encounter within last 12 months    Recent Outpatient Visits          3 months ago Type 2 diabetes mellitus without complication, without long-term current use of insulin ([place])   [place], Delphia Grates, NP   6 months ago Type 2 diabetes mellitus without complication, without long-term current use of insulin ([place])   [place], Delphia Grates, NP   1 year ago Routine general medical examination at a health care facility   [place], [place], NP   1 year ago Adhesive capsulitis of left shoulder   [place], Jeremy E, MD   1 year ago Acute pain of left shoulder   [facility], Delphia Grates, NP            meloxicam (MOBIC) 15 MG tablet 30 tablet 2    Sig: Take 1 tablet (15 mg total) by mouth daily as needed for pain.     Analgesics:  COX2 Inhibitors Passed - 06/26/2018 10:55 AM      Passed - HGB in normal range and within 360 days    Hemoglobin  Date Value Ref Range Status  12/12/2017 14.8 13.0 - 17.0 g/dL Final         Passed - Cr in normal range and within 360 days   Creatinine, Ser  Date Value Ref Range Status  12/12/2017 0.93 0.40 - 1.50 mg/dL Final         Passed - Patient is not pregnant      Passed - Valid encounter within last 12 months    Recent Outpatient Visits          3 months ago Type 2 diabetes mellitus without complication, without long-term current use of insulin ([place])   [place], Delphia Grates, NP   6 months ago Type 2 diabetes mellitus without complication, without long-term current use of insulin ([place])   [place], Delphia Grates, NP   1 year ago Routine general medical examination at a health care facility   [place], [place], NP   1 year ago Adhesive capsulitis of left shoulder   [place], Enid Baas, MD   1 year ago Acute pain of left shoulder   [place], Delphia Grates, NP

## 2018-09-15 ENCOUNTER — Other Ambulatory Visit: Payer: Self-pay | Admitting: Family

## 2018-09-15 DIAGNOSIS — E782 Mixed hyperlipidemia: Secondary | ICD-10-CM

## 2018-09-15 MED ORDER — ATORVASTATIN CALCIUM 20 MG PO TABS: 20.0000 mg | ORAL_TABLET | Freq: Every day | ORAL | 0 refills | Status: DC

## 2018-11-17 ENCOUNTER — Other Ambulatory Visit: Payer: Self-pay | Admitting: Internal Medicine

## 2018-11-18 ENCOUNTER — Other Ambulatory Visit: Payer: Self-pay | Admitting: Family

## 2018-11-18 DIAGNOSIS — E119 Type 2 diabetes mellitus without complications: Secondary | ICD-10-CM

## 2018-11-18 MED ORDER — METFORMIN HCL ER 500 MG PO TB24: 500.0000 mg | ORAL_TABLET | Freq: Two times a day (BID) | ORAL | 1 refills | Status: DC

## 2018-11-28 ENCOUNTER — Encounter: Payer: Self-pay | Admitting: Family

## 2018-11-28 ENCOUNTER — Other Ambulatory Visit: Payer: Self-pay

## 2018-11-28 ENCOUNTER — Other Ambulatory Visit: Payer: Self-pay | Admitting: Family

## 2018-11-28 ENCOUNTER — Other Ambulatory Visit (INDEPENDENT_AMBULATORY_CARE_PROVIDER_SITE_OTHER): Payer: BC Managed Care – PPO

## 2018-11-28 ENCOUNTER — Ambulatory Visit (INDEPENDENT_AMBULATORY_CARE_PROVIDER_SITE_OTHER): Payer: BC Managed Care – PPO | Admitting: Family

## 2018-11-28 VITALS — BP 136/80 | HR 80 | Temp 97.7°F | Ht 72.0 in | Wt 223.0 lb

## 2018-11-28 DIAGNOSIS — K219 Gastro-esophageal reflux disease without esophagitis: Secondary | ICD-10-CM | POA: Insufficient documentation

## 2018-11-28 DIAGNOSIS — G6181 Chronic inflammatory demyelinating polyneuritis: Secondary | ICD-10-CM | POA: Diagnosis not present

## 2018-11-28 DIAGNOSIS — E119 Type 2 diabetes mellitus without complications: Secondary | ICD-10-CM | POA: Diagnosis not present

## 2018-11-28 DIAGNOSIS — E7800 Pure hypercholesterolemia, unspecified: Secondary | ICD-10-CM

## 2018-11-28 DIAGNOSIS — Z125 Encounter for screening for malignant neoplasm of prostate: Secondary | ICD-10-CM

## 2018-11-28 DIAGNOSIS — E782 Mixed hyperlipidemia: Secondary | ICD-10-CM

## 2018-11-28 LAB — COMPREHENSIVE METABOLIC PANEL
ALT: 30 U/L (ref 0–53)
AST: 18 U/L (ref 0–37)
Albumin: 4.6 g/dL (ref 3.5–5.2)
Alkaline Phosphatase: 64 U/L (ref 39–117)
BUN: 17 mg/dL (ref 6–23)
CO2: 28 mEq/L (ref 19–32)
Calcium: 9.6 mg/dL (ref 8.4–10.5)
Chloride: 99 mEq/L (ref 96–112)
Creatinine, Ser: 0.9 mg/dL (ref 0.40–1.50)
GFR: 105.57 mL/min (ref >60.00)
Glucose, Bld: 206 mg/dL — ABNORMAL HIGH (ref 70–99)
Potassium: 4.3 mEq/L (ref 3.5–5.1)
Sodium: 137 mEq/L (ref 135–145)
Total Bilirubin: 0.6 mg/dL (ref 0.2–1.2)
Total Protein: 7.1 g/dL (ref 6.0–8.3)

## 2018-11-28 LAB — CBC WITH DIFFERENTIAL/PLATELET
Basophils Absolute: 0 10*3/uL (ref 0.0–0.1)
Basophils Relative: 0.7 % (ref 0.0–3.0)
Eosinophils Absolute: 0.2 10*3/uL (ref 0.0–0.7)
Eosinophils Relative: 3.1 % (ref 0.0–5.0)
HCT: 43.7 % (ref 39.0–52.0)
Hemoglobin: 14.6 g/dL (ref 13.0–17.0)
Lymphocytes Relative: 29.2 % (ref 12.0–46.0)
Lymphs Abs: 1.8 10*3/uL (ref 0.7–4.0)
MCHC: 33.4 g/dL (ref 30.0–36.0)
MCV: 83.1 fl (ref 78.0–100.0)
Monocytes Absolute: 0.6 10*3/uL (ref 0.1–1.0)
Monocytes Relative: 10 % (ref 3.0–12.0)
Neutro Abs: 3.5 10*3/uL (ref 1.4–7.7)
Neutrophils Relative %: 57 % (ref 43.0–77.0)
Platelets: 235 10*3/uL (ref 150.0–400.0)
RBC: 5.26 Mil/uL (ref 4.22–5.81)
RDW: 13.6 % (ref 11.5–15.5)
WBC: 6.2 10*3/uL (ref 4.0–10.5)

## 2018-11-28 LAB — LIPID PANEL
Cholesterol: 189 mg/dL (ref 0–200)
HDL: 43.8 mg/dL (ref >39.00)
LDL Cholesterol: 110 mg/dL — ABNORMAL HIGH (ref 0–99)
NonHDL: 144.78
Total CHOL/HDL Ratio: 4
Triglycerides: 175 mg/dL — ABNORMAL HIGH (ref 0.0–149.0)
VLDL: 35 mg/dL (ref 0.0–40.0)

## 2018-11-28 LAB — PSA: PSA: 1.45 ng/mL (ref 0.10–4.00)

## 2018-11-28 LAB — HEMOGLOBIN A1C: Hgb A1c MFr Bld: 8.7 % — ABNORMAL HIGH (ref 4.6–6.5)

## 2018-11-28 MED ORDER — METFORMIN HCL ER 500 MG PO TB24: 1000.0000 mg | ORAL_TABLET | Freq: Two times a day (BID) | ORAL | 1 refills | Status: DC

## 2018-11-28 MED ORDER — OMEPRAZOLE 40 MG PO CPDR: 40.0000 mg | DELAYED_RELEASE_CAPSULE | Freq: Every day | ORAL | 3 refills | Status: DC

## 2018-11-28 MED ORDER — JARDIANCE 10 MG PO TABS: 10.0000 mg | ORAL_TABLET | Freq: Every day | ORAL | 3 refills | Status: DC

## 2018-11-28 MED ORDER — ATORVASTATIN CALCIUM 20 MG PO TABS: 20.0000 mg | ORAL_TABLET | Freq: Every day | ORAL | 3 refills | Status: DC

## 2018-11-28 NOTE — Progress Notes (Signed)
Austin Meadows is a 58 y.o. male with the following history as recorded in EpicCare:  Patient Active Problem List   Diagnosis Date Noted  . Gastroesophageal reflux disease 11/28/2018  . Erectile dysfunction 12/12/2017  . Hypercholesteremia 12/12/2017  . Routine general medical examination at a health care facility 04/12/2017  . 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 CIDP (chronic inflammatory demyelinating polyneuropathy) ([place]) 04/11/2015  . Type 2 diabetes mellitus ([place]) 06/24/2014  . Family history of colon cancer 06/09/2014    Current Outpatient Medications  Medication Sig Dispense Refill  . atorvastatin (LIPITOR) 20 MG tablet Take 1 tablet (20 mg total) by mouth daily. 90 tablet 0  . Lancets MISC Use to test blood sugars 2 times daily. 100 each 3  . meloxicam (MOBIC) 15 MG tablet Take 1 tablet (15 mg total) by mouth daily as needed for pain. Follow-up appt due in May must see provider for future refills 30 tablet 0  . metFORMIN (GLUCOPHAGE-XR) 500 MG 24 hr tablet Take 1 tablet (500 mg total) by mouth 2 (two) times daily. 60 tablet 1  . omeprazole (PRILOSEC) 40 MG capsule Take 1 capsule (40 mg total) by mouth daily. 90 capsule 3   No current facility-administered medications for this visit.     Allergies: Influenza vaccines and Penicillins  Past Medical History:  Diagnosis Date  . Chicken pox   . Diabetes mellitus without complication ([place])    type 2  . Hemorrhoid   . Hyperlipidemia   . Jaundice of newborn   . Rheumatic fever   . UTI (lower urinary tract infection)     Past Surgical History:  Procedure Laterality Date  . COLONOSCOPY  ages [age]   done at [facility] and [facility](pt unable to get records)    Family History  Problem Relation Age of Onset  . Arthritis Mother   . Cancer Mother        bone cancer  . Colon cancer Father 35  . Prostate cancer Father   . Heart disease Father   . Hypertension Father   . Diabetes Father   . Colon  cancer Brother 58       late 57's when dx  . Cancer Brother        another cancer METS to brain  . Colon cancer Brother 37  . Colon cancer Other 23       Bill's daughter  . Healthy Son        x 4  . Healthy Daughter        x 1    Social History   Tobacco Use  . Smoking status: Former Smoker    Quit date: 06/04/1994    Years since quitting: 24.5  . Smokeless tobacco: Never Used  Substance Use Topics  . Alcohol use: Yes    Alcohol/week: 0.0 standard drinks    Comment: Occasionally (one drink per week).  Heavier drinking in the [employer] for ~ 8 years.     Subjective:  P atient presents to transfer care from another provider who has left the office; History of Type 2 Diabetes, hyperlipidemia, GERD; does check his blood sugars regularly; no concerns for low blood sugar; Denies any chest pain, shortness of breath, blurred vision or headache. Needs to get scheduled with ophthalmology;    Objective:  Vitals:   11/28/18 0926  BP: 136/80  Pulse: 80  Temp: 97.7 F (36.5 C)  TempSrc: Oral  SpO2: 96%  Weight: 223 lb (101.2 kg)  Height: 6' (1.829 m)    General: Well developed, well nourished, in no acute distress  Skin : Warm and dry.  Head: Normocephalic and atraumatic  Eyes: Sclera and conjunctiva clear; pupils round and reactive to light; extraocular movements intact  Lungs: Respirations unlabored; clear to auscultation bilaterally without wheeze, rales, rhonchi  CVS exam: normal rate and regular rhythm.  Musculoskeletal: No deformities; no active joint inflammation  Neurologic: Alert and oriented; speech intact; face symmetrical; moves all extremities well; CNII-XII intact without focal deficit  Assessment:  1. Type 2 diabetes mellitus without complication, without long-term current use of insulin ([place])   2. CIDP (chronic inflammatory demyelinating polyneuropathy) ([facility])   3. Hypercholesteremia   4. Prostate cancer screening   5. Gastroesophageal reflux disease, esophagitis  presence not specified     Plan:  1. Stable; update labs today; plan for 6 month follow-up; encouraged to see his eye doctor for yearly follow-up. 2. Occurred secondary to allergic reaction with flu shot- no further complications; does not take flu shots; 3. Check lipid panel today; 4. Check PSA; 5. Stable; refill on Omeprazole 40 mg daily;   No follow-ups on file.  Orders Placed This Encounter  Procedures  . CBC w/Diff    Standing Status:   Future    Number of Occurrences:   1    Standing Expiration Date:   11/28/2019  . Comp Met (CMET)    Standing Status:   Future    Number of Occurrences:   1    Standing Expiration Date:   11/28/2019  . Lipid panel    Standing Status:   Future    Number of Occurrences:   1    Standing Expiration Date:   11/28/2019  . HgB A1c    Standing Status:   Future    Number of Occurrences:   1    Standing Expiration Date:   11/28/2019  . PSA    Standing Status:   Future    Number of Occurrences:   1    Standing Expiration Date:   11/28/2019    Requested Prescriptions   Signed Prescriptions Disp Refills  . omeprazole (PRILOSEC) 40 MG capsule 90 capsule 3    Sig: Take 1 capsule (40 mg total) by mouth daily.

## 2018-12-15 ENCOUNTER — Telehealth: Payer: Self-pay | Admitting: Family

## 2018-12-15 NOTE — Telephone Encounter (Signed)
Patient calling and states that he thinks the empagliflozin (JARDIANCE) 10 MG TABS tablet Is making his blood sugar get low. Has not been able to check blood sugar due to not having lancets or testing strips. States that he is going to send a MyChart message once he is home with the name of his meter. Please advise  CB#: [phone]

## 2018-12-16 ENCOUNTER — Telehealth: Payer: Self-pay

## 2018-12-16 NOTE — Telephone Encounter (Signed)
Copied from [place] [id number]. Topic: Conservator, museum/gallery Patient (Clinic Use ONLY) >> Dec 16, 2018  1:57 PM Marcina Millard, CMA wrote: Reason for CRM: Patient to call back and provide  name of meter he uses so we can send in test strips/lancets. >> Dec 16, 2018  3:40 PM Leward Quan A wrote: Patient called back to say that he was not able to find the old glucometer but it was a one touch meter. Asking if a new meter can be ordered please contact patient at Ph#   [phone]

## 2018-12-16 NOTE — Telephone Encounter (Signed)
Spoke with patient. He will call me back shortly to provide name of meter.

## 2018-12-16 NOTE — Telephone Encounter (Signed)
Ask him to back the Metformin down to 1 in the am and 1 in the pm. Let's keep the [place] for now. I would like to know what his readings are so please let us know what strips he needs sent.

## 2018-12-17 ENCOUNTER — Other Ambulatory Visit: Payer: Self-pay | Admitting: Family

## 2018-12-17 MED ORDER — BLOOD GLUCOSE MONITOR KIT: PACK | 0 refills | Status: DC

## 2018-12-17 NOTE — Telephone Encounter (Signed)
Script faxed in this morning to [facility] on [address].

## 2018-12-17 NOTE — Telephone Encounter (Signed)
Spoke with patient today and info given. I have asked him to call us on next Tuesday and give [name] his readings. Will call him on Tuesday if he hasn't called [name] to obtain readings.

## 2018-12-17 NOTE — Telephone Encounter (Signed)
The order for the glucometer has to be faxed to the pharmacy. Let me hear back from him early next week please with the changes we made before I am gone on vacation.

## 2018-12-23 ENCOUNTER — Telehealth: Payer: Self-pay

## 2018-12-23 NOTE — Telephone Encounter (Signed)
Copied from [place] [id number]. Topic: General - Other >> Dec 23, 2018 12:56 PM Keene Breath wrote: Reason for CRM: Patient called to ask the nurse to call him regarding his glucose log for the past 5 days that was requested by the doctor. Glucose #s , Friday morn. 106, Sat. 132, Sun. 128, Mon. 116, Tues 112. Please call to discuss at [phone] .

## 2018-12-23 NOTE — Telephone Encounter (Signed)
Those numbers look great. How is he feeling with the decreased Metformin?

## 2018-12-24 NOTE — Telephone Encounter (Signed)
Let's stay with the current regimen. I am glad to hear he is feeling good. He needs to be seen around January 2021.

## 2018-12-25 NOTE — Telephone Encounter (Signed)
Spoke with patient and instructions given.  I made him an appointment for 03/20/19.In the meantime he had made appointment to come back in March.  Did you want that one cancelled since we were bringing him back earlier or leave it as a 2 month follow-up appointment after the January appointment?

## 2019-01-19 ENCOUNTER — Ambulatory Visit: Payer: Self-pay | Admitting: *Deleted

## 2019-01-19 NOTE — Telephone Encounter (Signed)
Calls with tingling fingertips on both hands for one week. Tip of tongue feels numb, comes and goes and just started noticing about 3 days ago. Denies difficulty breathing/CP/facial asymmetry/one-sided weakness/vision changes/headaches. No problems swallowing. No fever. Fasting blood sugars when checked are 105-115. Advised to check fasting CBG daily this week. Reviewed urgent symptoms needing immediate evaluation at the ED. Stated he understood. No fever/No covid contacts/No travels. He requested appointment on his day off, Thursday or Friday. Scheduled for Friday morning.  Reason for Disposition . [1] Numbness or tingling on both sides of body AND [2] is a new symptom present > 24 hours  Answer Assessment - Initial Assessment Questions 1. SYMPTOM: "What is the main symptom you are concerned about?" (e.g., weakness, numbness)     Tingling in all fingertips started about one week 2. ONSET: "When did this start?" (minutes, hours, days; while sleeping)     One week ago 3. LAST NORMAL: "When was the last time you were normal (no symptoms)?"    One week ago 4. PATTERN "Does this come and go, or has it been constant since it started?"  "Is it present now?"     constant 5. CARDIAC SYMPTOMS: "Have you had any of the following symptoms: chest pain, difficulty breathing, palpitations?"    no 6. NEUROLOGIC SYMPTOMS: "Have you had any of the following symptoms: headache, dizziness, vision loss, double vision, changes in speech, unsteady on your feet?"     None of these 7. OTHER SYMPTOMS: "Do you have any other symptoms?"     Tip of tongue is numb comes and goes for the last 2-3 days. 8. PREGNANCY: "Is there any chance you are pregnant?" "When was your last menstrual period?"     na  Protocols used: NEUROLOGIC DEFICIT-A-AH

## 2019-01-23 ENCOUNTER — Other Ambulatory Visit: Payer: Self-pay

## 2019-01-23 ENCOUNTER — Encounter: Payer: Self-pay | Admitting: Family

## 2019-01-23 ENCOUNTER — Ambulatory Visit (INDEPENDENT_AMBULATORY_CARE_PROVIDER_SITE_OTHER): Payer: BC Managed Care – PPO | Admitting: Family

## 2019-01-23 ENCOUNTER — Other Ambulatory Visit (INDEPENDENT_AMBULATORY_CARE_PROVIDER_SITE_OTHER): Payer: BC Managed Care – PPO

## 2019-01-23 VITALS — BP 128/78 | HR 94 | Temp 98.1°F | Ht 72.0 in | Wt 213.1 lb

## 2019-01-23 DIAGNOSIS — R202 Paresthesia of skin: Secondary | ICD-10-CM | POA: Diagnosis not present

## 2019-01-23 DIAGNOSIS — E119 Type 2 diabetes mellitus without complications: Secondary | ICD-10-CM | POA: Diagnosis not present

## 2019-01-23 DIAGNOSIS — R200 Anesthesia of skin: Secondary | ICD-10-CM | POA: Diagnosis not present

## 2019-01-23 LAB — COMPREHENSIVE METABOLIC PANEL
ALT: 22 U/L (ref 0–53)
AST: 13 U/L (ref 0–37)
Albumin: 4.9 g/dL (ref 3.5–5.2)
Alkaline Phosphatase: 62 U/L (ref 39–117)
BUN: 21 mg/dL (ref 6–23)
CO2: 28 mEq/L (ref 19–32)
Calcium: 10.1 mg/dL (ref 8.4–10.5)
Chloride: 102 mEq/L (ref 96–112)
Creatinine, Ser: 0.97 mg/dL (ref 0.40–1.50)
GFR: 96.77 mL/min (ref >60.00)
Glucose, Bld: 109 mg/dL — ABNORMAL HIGH (ref 70–99)
Potassium: 4.7 mEq/L (ref 3.5–5.1)
Sodium: 137 mEq/L (ref 135–145)
Total Bilirubin: 0.5 mg/dL (ref 0.2–1.2)
Total Protein: 7.9 g/dL (ref 6.0–8.3)

## 2019-01-23 LAB — MAGNESIUM: Magnesium: 2.2 mg/dL (ref 1.5–2.5)

## 2019-01-23 LAB — VITAMIN B12: Vitamin B-12: 312 pg/mL (ref 211–911)

## 2019-01-23 LAB — HEMOGLOBIN A1C: Hgb A1c MFr Bld: 7.1 % — ABNORMAL HIGH (ref 4.6–6.5)

## 2019-01-23 NOTE — Progress Notes (Signed)
Austin Meadows is a 58 y.o. male with the following history as recorded in EpicCare:  Patient Active Problem List   Diagnosis Date Noted  . Gastroesophageal reflux disease 11/28/2018  . Erectile dysfunction 12/12/2017  . Hypercholesteremia 12/12/2017  . Routine general medical examination at a health care facility 04/12/2017  . 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 CIDP (chronic inflammatory demyelinating polyneuropathy) ([place]) 04/11/2015  . Type 2 diabetes mellitus ([place]) 06/24/2014  . Family history of colon cancer 06/09/2014    Current Outpatient Medications  Medication Sig Dispense Refill  . atorvastatin (LIPITOR) 20 MG tablet Take 1 tablet (20 mg total) by mouth daily. 90 tablet 3  . blood glucose meter kit and supplies KIT Dispense based on patient and insurance preference. Use up to four times daily as directed. (FOR ICD-9 250.00, 250.01). 1 each 0  . empagliflozin (JARDIANCE) 10 MG TABS tablet Take 10 mg by mouth daily before breakfast. 30 tablet 3  . Lancets MISC Use to test blood sugars 2 times daily. 100 each 3  . meloxicam (MOBIC) 15 MG tablet Take 1 tablet (15 mg total) by mouth daily as needed for pain. Follow-up appt due in May must see provider for future refills 30 tablet 0  . metFORMIN (GLUCOPHAGE-XR) 500 MG 24 hr tablet Take 2 tablets (1,000 mg total) by mouth 2 (two) times daily. 360 tablet 1  . omeprazole (PRILOSEC) 40 MG capsule Take 1 capsule (40 mg total) by mouth daily. 90 capsule 3  . ONETOUCH ULTRA test strip USE TO CHECK GLUCOSE UP TO 4 TIMES DAILY AS DIRECTED     No current facility-administered medications for this visit.     Allergies: Influenza vaccines and Penicillins  Past Medical History:  Diagnosis Date  . Chicken pox   . Diabetes mellitus without complication ([place])    type 2  . Hemorrhoid   . Hyperlipidemia   . Jaundice of newborn   . Rheumatic fever   . UTI (lower urinary tract infection)     Past Surgical History:  Procedure  Laterality Date  . COLONOSCOPY  ages [age]   done at [facility] and [facility](pt unable to get records)    Family History  Problem Relation Age of Onset  . Arthritis Mother   . Cancer Mother        bone cancer  . Colon cancer Father 38  . Prostate cancer Father   . Heart disease Father   . Hypertension Father   . Diabetes Father   . Colon cancer Brother 53       late 42's when dx  . Cancer Brother        another cancer METS to brain  . Colon cancer Brother 53  . Colon cancer Other 88       Bill's daughter  . Healthy Son        x 4  . Healthy Daughter        x 1    Social History   Tobacco Use  . Smoking status: Former Smoker    Quit date: 06/04/1994    Years since quitting: 24.6  . Smokeless tobacco: Never Used  Substance Use Topics  . Alcohol use: Yes    Alcohol/week: 0.0 standard drinks    Comment: Occasionally (one drink per week).  Heavier drinking in the [employer] for ~ 8 years.     Subjective:  "Tingling" in tips of fingers x 2 weeks; no known injury to neck; feels like  symptoms have progressed to involve the tips of toes and tongue; no known injury to neck or low back; no loss of sense of taste or smell, no difficulty breathing; At last OV, metformin was increased to 1000 mg bid and Jardiance 10 mg was added; patient has lost 10 pounds since last OV; notes fasting sugar this am was 102;     Objective:  Vitals:   01/23/19 1045  BP: 128/78  Pulse: 94  Temp: 98.1 F (36.7 C)  TempSrc: Oral  SpO2: 97%  Weight: 213 lb 1.3 oz (96.7 kg)  Height: 6' (1.829 m)    General: Well developed, well nourished, in no acute distress  Skin : Warm and dry.  Head: Normocephalic and atraumatic  Oropharynx: Pink, supple. No suspicious lesions  Neck: Supple without thyromegaly, adenopathy  Lungs: Respirations unlabored; clear to auscultation bilaterally without wheeze, rales, rhonchi  CVS exam: normal rate and regular rhythm.  Extremities: No edema, cyanosis,  clubbing  Vessels: Symmetric bilaterally  Neurologic: Alert and oriented; speech intact; face symmetrical; moves all extremities well; CNII-XII intact without focal deficit   Assessment:  1. Numbness and tingling   2. Type 2 diabetes mellitus without complication, without long-term current use of insulin ([place])     Plan:  1. ? Medication reaction; hold Jardiance for now; check CMP, B12, magnesium today; follow-up to be determined; 2. Patient has lost 10 pounds since last OV- congratulated patient on lifestyle changes; continue working on weight loss goals.   No follow-ups on file.  Orders Placed This Encounter  Procedures  . Comp Met (CMET)    Standing Status:   Future    Number of Occurrences:   1    Standing Expiration Date:   01/23/2020  . B12    Standing Status:   Future    Number of Occurrences:   1    Standing Expiration Date:   01/23/2020  . Magnesium    Standing Status:   Future    Number of Occurrences:   1    Standing Expiration Date:   01/23/2020  . HgB A1c    Standing Status:   Future    Number of Occurrences:   1    Standing Expiration Date:   01/23/2020    Requested Prescriptions    No prescriptions requested or ordered in this encounter

## 2019-02-02 ENCOUNTER — Telehealth: Payer: Self-pay

## 2019-02-02 NOTE — Telephone Encounter (Signed)
Spoke with patient and info given. He will call back on Friday and touch basis.

## 2019-02-02 NOTE — Telephone Encounter (Signed)
The only other option I have is to back the Metformin back to 500 mg bid ( not 1000 bid) and re-start the Jardiance. If the symptoms persist with that, we will have to get him to neurology. Let me hear back from him by the end of the week.

## 2019-02-02 NOTE — Telephone Encounter (Signed)
Copied from [place] [id number]. Topic: General - Other >> Feb 02, 2019  8:35 AM Carolyn Stare wrote: Pt said he is still having the tingling and has not taking the jaudiance in a week per Mickel Baas req

## 2019-02-06 ENCOUNTER — Encounter: Payer: Self-pay | Admitting: Neurology

## 2019-02-06 ENCOUNTER — Other Ambulatory Visit: Payer: Self-pay | Admitting: Family

## 2019-02-06 DIAGNOSIS — R200 Anesthesia of skin: Secondary | ICD-10-CM

## 2019-02-06 MED ORDER — GABAPENTIN 100 MG PO CAPS: ORAL_CAPSULE | ORAL | 0 refills | Status: DC

## 2019-02-06 NOTE — Telephone Encounter (Signed)
Can try Neurontin; start with 1 at night and can increase to 2 at night if needed;

## 2019-02-06 NOTE — Telephone Encounter (Signed)
Spoke with patient today.  He is still having tingling in his hands, bottom of feet and into his toes.  I told him I would go ahead and have you do referral for neurology.  Side note he hasn't been taking his Jardiance on a regular basis but said he would get back on it today. States his readings have been good though with averaging with the highest of 115.

## 2019-02-06 NOTE — Telephone Encounter (Signed)
Spoke with patient and info given.  He has been taking the Metformin 500 mg BID.  In the meantime neurology has called him but can't see him until 03/16/19. He wanted to know if you recommend something for him to take with the nerve pain until he is able to get in.

## 2019-02-06 NOTE — Telephone Encounter (Signed)
If his sugar is at 115 ( highest), he doesn't need Jardiance; can just take the Metformin. Please clarify what dosage he has been taking.

## 2019-02-06 NOTE — Addendum Note (Signed)
Addended by: Sherlene Shams on: 02/06/2019 05:36 PM   Modules accepted: Orders

## 2019-02-09 ENCOUNTER — Encounter: Payer: Self-pay | Admitting: Family

## 2019-02-09 ENCOUNTER — Telehealth: Payer: Self-pay | Admitting: Family

## 2019-02-09 NOTE — Telephone Encounter (Signed)
No. This is the first I am hearing of this. I need to call him today so I will get more info and see if he is bringing his forms in or having them faxed.

## 2019-02-09 NOTE — Telephone Encounter (Signed)
Message sent to patient via my-chart today with info.

## 2019-02-09 NOTE — Telephone Encounter (Signed)
I have not seen these forms, Are you aware of them?

## 2019-02-09 NOTE — Telephone Encounter (Signed)
Spoke with patient today. 

## 2019-02-09 NOTE — Telephone Encounter (Signed)
Patient called wanting to speak with [facility]. He is in the process of filing Short term disability due to his health issues. He would like a call back to discuss this.

## 2019-02-16 DIAGNOSIS — Z0279 Encounter for issue of other medical certificate: Secondary | ICD-10-CM

## 2019-02-16 NOTE — Telephone Encounter (Signed)
Forms have been signed, Faxed, sent to scan &Charged for.   Original has been mailed to patient & he has been informed.

## 2019-02-16 NOTE — Telephone Encounter (Signed)
Forms have been completed & Placed in Laura's box to be review and signed.

## 2019-03-16 ENCOUNTER — Encounter: Payer: Self-pay | Admitting: Neurology

## 2019-03-16 ENCOUNTER — Ambulatory Visit: Payer: BC Managed Care – PPO | Admitting: Neurology

## 2019-03-16 ENCOUNTER — Other Ambulatory Visit: Payer: Self-pay

## 2019-03-16 VITALS — BP 147/98 | HR 92 | Resp 16 | Ht 71.0 in | Wt 212.6 lb

## 2019-03-16 DIAGNOSIS — G6181 Chronic inflammatory demyelinating polyneuritis: Secondary | ICD-10-CM

## 2019-03-16 MED ORDER — GABAPENTIN 300 MG PO CAPS: 300.0000 mg | ORAL_CAPSULE | Freq: Two times a day (BID) | ORAL | 2 refills | Status: DC

## 2019-03-16 MED ORDER — SODIUM CHLORIDE 0.9 % IV SOLN: 1000.0000 mg | INTRAVENOUS | Status: AC

## 2019-03-16 NOTE — Progress Notes (Signed)
[facility] Clinic Note - Initial Visit   Date: 03/16/19  Austin Meadows MRN: [id number] DOB: 03/25/1960   Dear Austin Mourning, FNP:  Thank you for your kind referral of Austin Meadows for consultation of paresthesias. Although his history is well known to you, please allow [name] to reiterate it for the purpose of our medical record. The patient was accompanied to the clinic by self.   History of Present Illness: [name] is a 59 y.o. right-handed male with diabetes mellitus, hyperlipidemia, and history of CIDP (2016) presenting for evaluation of paresthesias.   He was previously seen by me in February 2017 for CIDP and managed with solumedrol which markedly improved symptoms.   In November  2020, he was started in [place] and after being on it for two weeks, he developed tingling in the hands, nose, lips, tongue, and feet.  He stopped the medication, but symptoms continue to progress into December, but since mid-December, tingling and numbness of the face has improved.  He continues to have numbness/tingling in the hands and feet.  He describes his feet, as if he is walking on marbles.  He can manipulate car pedals safely.  He denies weakness.  He has significant imbalance and always walks with shoes because it helps stabilize him.  He has not been working for the past month due to severity of balance and reduced sensation making motor movement difficult.    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  The electrophysiologic findings are most consistent with a severe sensorimotor polyradiculoneuropathy, axon loss and demyelinating in type.  Past Medical History:  Diagnosis Date  . Chicken pox   . Diabetes mellitus without complication ([place])    type 2  . Hemorrhoid   . Hyperlipidemia   . Jaundice of newborn   . Rheumatic fever   . UTI (lower urinary tract infection)     Past Surgical History:  Procedure Laterality Date  . COLONOSCOPY  ages [phone]   done at [facility] and [facility](pt unable to get records)     Medications:  Outpatient Encounter Medications as of 03/16/2019  Medication Sig  . atorvastatin (LIPITOR) 20 MG tablet Take 1 tablet (20 mg total) by mouth daily.  . blood glucose meter kit and supplies KIT Dispense based on patient and insurance preference. Use up to four times daily as directed. (FOR ICD-9 250.00, 250.01).  [name] Kitchen empagliflozin (JARDIANCE) 10 MG TABS tablet Take 10 mg by mouth daily before breakfast.  . gabapentin (NEURONTIN) 100 MG capsule Take 1 at night; increase to  2 at night as directed  . Lancets MISC Use to test blood sugars 2 times daily.  . meloxicam (MOBIC) 15 MG tablet Take 1 tablet (15 mg total) by mouth daily as needed for pain. Follow-up appt due in May must see provider for future refills  . metFORMIN (GLUCOPHAGE-XR) 500 MG 24 hr tablet Take 2 tablets (1,000 mg total) by mouth 2 (two) times daily.  [name] Kitchen omeprazole (  PRILOSEC) 40 MG capsule Take 1 capsule (40 mg total) by mouth daily.  Glory Rosebush ULTRA test strip USE TO CHECK GLUCOSE UP TO 4 TIMES DAILY AS DIRECTED   No facility-administered encounter medications on file as of 03/16/2019.    Allergies:  Allergies  Allergen Reactions  . Influenza Vaccines Other (See Comments)    Guillain barre syndrome  . Penicillins Other (See Comments)    Pt can't remember reaction.    Family History: Family History  Problem Relation Age of Onset  .  Arthritis Mother   . Cancer Mother        bone cancer  . Colon cancer Father 7  . Prostate cancer Father   . Heart disease Father   . Hypertension Father   . Diabetes Father   . Colon cancer Brother 10       late 46's when dx  . Cancer Brother        another cancer METS to brain  . Colon cancer Brother 36  . Colon cancer Other 29       Bill's daughter  . Healthy Son        x 4  . Healthy Daughter        x 1    Social History: Social History   Tobacco Use  . Smoking status: Former Smoker    Quit date: 06/04/1994    Years since quitting: 24.7  . Smokeless tobacco: Never Used  Substance Use Topics  . Alcohol use: Yes    Alcohol/week: 0.0 standard drinks    Comment: Occasionally (one drink per week).  Heavier drinking in the [employer] for ~ 8 years.   . Drug use: No   Social History   Social History Narrative   Currently has 5 children; wife has 3 children.  Lives with his wife; Fun: rides a motorcycle.   Denies any religious beliefs that would effect health care.    Lives in a 2 story home.     Works as a Diplomatic Services operational officer.  Education: associate degree.    Vital Signs:  BP (!) 147/98 (BP Location: Left Arm, Patient Position: Sitting)   Pulse 92   Resp 16   Ht 5' 11" (1.803 m)   Wt 212 lb 9.6 oz (96.4 kg)   SpO2 95%   BMI 29.65 kg/m    Neurological Exam: MENTAL STATUS including orientation to time, place, person, recent and remote memory, attention span and concentration, language, and fund of knowledge is normal.  Speech is not dysarthric.  CRANIAL NERVES: II:  No visual field defects.   III-IV-VI: Pupils equal round and reactive to light.  Normal conjugate, extra-ocular eye movements in all directions of gaze.  No nystagmus.  No ptosis.   V:  Normal facial sensation to temperature and pin prick   VII:  Normal facial symmetry and movements.   VIII:  Normal hearing and vestibular function.   IX-X:  Normal palatal movement.   XI:  Normal shoulder shrug and  head rotation.   XII:  Normal tongue strength and range of motion, no deviation or fasciculation.  MOTOR:  No atrophy, fasciculations or abnormal movements.  No pronator drift.   Upper Extremity:  Right  Left  Deltoid  5/5   5/5   Biceps  5/5   5/5   Triceps  5/5   5/5   Infraspinatus 5/5  5/5  Medial pectoralis 5/5  5/5  Wrist extensors  5/5   5/5   Wrist  flexors  5/5   5/5   Finger extensors  5/5   5/5   Finger flexors  5/5   5/5   Dorsal interossei  5/5   5/5   Abductor pollicis  5/5   5/5   Tone (Ashworth scale)  0  0   Lower Extremity:  Right  Left  Hip flexors  5/5   5/5   Hip extensors  5/5   5/5   Adductor 5/5  5/5  Abductor 5/5  5/5  Knee flexors  5/5   5/5   Knee extensors  5/5   5/5   Dorsiflexors  5/5   5/5   Plantarflexors  5/5   5/5   Toe extensors  5/5   5/5   Toe flexors  5/5   5/5   Tone (Ashworth scale)  0  0   MSRs:  Right        Left                  brachioradialis 2+  2+  biceps 2+  2+  triceps 2+  2+  patellar 2+  2+  ankle jerk 0  0  Hoffman no  no  plantar response down  down   SENSORY:  Absent vibration at the great toe bilaterally, trace at the ankles, intact at the knees and MCP bilaterally.  Temperature is reduced in the feet.  Pin prick is intact throughout. Romberg's sign positive.[name] Kitchen   COORDINATION/GAIT: Normal finger-to- nose-finger.  Intact rapid alternating movements bilaterally.  Able to rise from a chair without using arms.  Gait is mildly-wide based, somewhat unsteady, unassisted.   IMPRESSION: Chronic inflammatory demyelinating polyradiculoneuropathy (diagnosed 2017) without relapse until November 2020.  Symptoms are very similar to what he experienced in 2017 and he responded extremely well to steroids. I will start induction dose of Solumedrol 1g x 5 days.  Side effects discussed, including monitoring for hyperglycemia Consider NCS/EMG, if no improvement  Return to clinic in 6 weeks   Thank you for allowing me to  participate in patient's care.  If I can answer any additional questions, I would be pleased to do so.    Sincerely,    Donika K. Posey Pronto, DO

## 2019-03-16 NOTE — Patient Instructions (Addendum)
Start solumedrol infusion 1g for 5 days.  Monitor your blood sugar on the days of your infusion.  Increase gabapentin to 300mg  twice daily  Return to clinic in 6 weeks

## 2019-03-19 ENCOUNTER — Telehealth: Payer: Self-pay | Admitting: Neurology

## 2019-03-19 ENCOUNTER — Telehealth: Payer: Self-pay

## 2019-03-19 ENCOUNTER — Ambulatory Visit (HOSPITAL_COMMUNITY)
Admission: RE | Admit: 2019-03-19 | Discharge: 2019-03-19 | Disposition: A | Discharge status: Home / Self Care | Payer: BC Managed Care – PPO | Source: Ambulatory Visit | Attending: Internal Medicine | Admitting: Internal Medicine

## 2019-03-19 ENCOUNTER — Other Ambulatory Visit: Payer: Self-pay

## 2019-03-19 DIAGNOSIS — G6181 Chronic inflammatory demyelinating polyneuritis: Secondary | ICD-10-CM | POA: Insufficient documentation

## 2019-03-19 MED ORDER — SODIUM CHLORIDE 0.9 % IV SOLN
INTRAVENOUS | Status: DC | PRN
  Administered 2019-03-19: 250 mL via INTRAVENOUS

## 2019-03-19 MED ORDER — SODIUM CHLORIDE 0.9 % IV SOLN
1000.0000 mg | Freq: Once | INTRAVENOUS | Status: AC
  Administered 2019-03-19: 12:00:00 1000 mg via INTRAVENOUS
  Filled 2019-03-19: qty 8

## 2019-03-19 NOTE — Telephone Encounter (Signed)
Solumedrol to give given as consecutive days (q24h) for 5 days.

## 2019-03-19 NOTE — Progress Notes (Signed)
PATIENT CARE CENTER NOTE  Diagnosis:  Chronic inflammatory demyelinating polyradiculoneuropathy   Provider: Joella Prince, MD   Procedure: Solu-medrol infusion   Note: Patient received Solu-medrol infusion via PIV. Tolerated well with no adverse reaction. Vital signs stable. Discharge instructions given. Patient is to get 4 more dose of solu-medrol for a total of 5 doses. Provider wants the patient to get these doses consecutively. Patient advised but says he would rather schedule his doses 1-2 days apart and would inform provider of his preference. Patient alert, oriented and ambulatory at discharge.

## 2019-03-19 NOTE — Telephone Encounter (Signed)
Austin Meadows is aware.  

## 2019-03-19 NOTE — Telephone Encounter (Signed)
What should they do about weekend dosages? Patient thought it was weekly, and the center is closed on the weekends

## 2019-03-19 NOTE — Telephone Encounter (Signed)
It's OK if the infusion skips 1-2 days, such as over the weekend.  He can continue again on Monday.

## 2019-03-19 NOTE — Discharge Instructions (Signed)
Methylprednisolone Solution for Injection What is this medicine? METHYLPREDNISOLONE (meth ill pred NISS oh lone) is a corticosteroid. It is commonly used to treat inflammation of the skin, joints, lungs, and other organs. Common conditions treated include asthma, allergies, and arthritis. It is also used for other conditions, such as blood disorders and diseases of the adrenal glands. This medicine may be used for other purposes; ask your health care provider or pharmacist if you have questions. COMMON BRAND NAME(S): A-Methapred, Solu-Medrol What should I tell my health care provider before I take this medicine? They need to know if you have any of these conditions:  Cushing's syndrome  eye disease, vision problems  diabetes  glaucoma  heart disease  high blood pressure  infection (especially a virus infection such as chickenpox, cold sores, or herpes)  liver disease  mental illness  myasthenia gravis  osteoporosis  recently received or scheduled to receive a vaccine  seizures  stomach or intestine problems  thyroid disease  an unusual or allergic reaction to lactose, methylprednisolone, other medicines, foods, dyes, or preservatives  pregnant or trying to get pregnant  breast-feeding How should I use this medicine? This medicine is for injection or infusion into a vein. It is also for injection into a muscle. It is given by a health care professional in a hospital or clinic setting. Talk to your pediatrician regarding the use of this medicine in children. While this drug may be prescribed for selected conditions, precautions do apply. Overdosage: If you think you have taken too much of this medicine contact a poison control center or emergency room at once. NOTE: This medicine is only for you. Do not share this medicine with others. What if I miss a dose? This does not apply. What may interact with this medicine? Do not take this medicine with any of the following  medications:  alefacept  echinacea  iopamidol  live virus vaccines  metyrapone  mifepristone This medicine may also interact with the following medications:  amphotericin B  aspirin and aspirin-like medicines  certain antibiotics like erythromycin, clarithromycin, troleandomycin  certain medicines for diabetes  certain medicines for fungal infection like ketoconazole  certain medicines for seizures like carbamazepine, phenobarbital, phenytoin  certain medicines that treat or prevent blood clots like warfarin  cyclosporine  digoxin  diuretics  male hormones, like estrogens and birth control pills  isoniazid  NSAIDS, medicines for pain and inflammation, like ibuprofen or naproxen  other medicines for myasthenia gravis  rifampin  vaccines This list may not describe all possible interactions. Give your health care provider a list of all the medicines, herbs, non-prescription drugs, or dietary supplements you use. Also tell them if you smoke, drink alcohol, or use illegal drugs. Some items may interact with your medicine. What should I watch for while using this medicine? Tell your doctor or healthcare professional if your symptoms do not start to get better or if they get worse. Do not stop taking except on your doctor's advice. You may develop a severe reaction. Your doctor will tell you how much medicine to take. Your condition will be monitored carefully while you are receiving this medicine. This medicine may increase your risk of getting an infection. Tell your doctor or health care professional if you are around anyone with measles or chickenpox, or if you develop sores or blisters that do not heal properly. This medicine may increase blood sugar. Ask your healthcare provider if changes in diet or medicines are needed if you have diabetes. Tell  your doctor or health care professional right away if you have any change in your eyesight. Using this medicine for a  long time may increase your risk of low bone mass. Talk to your doctor about bone health. What side effects may I notice from receiving this medicine? Side effects that you should report to your doctor or health care professional as soon as possible:  allergic reactions like skin rash, itching or hives, swelling of the face, lips, or tongue  bloody or tarry stools  hallucination, loss of contact with reality  muscle cramps  muscle pain  palpitations  signs and symptoms of high blood sugar such as being more thirsty or hungry or having to urinate more than normal. You may also feel very tired or have blurry vision.  signs and symptoms of infection like fever or chills; cough; sore throat; pain or trouble passing urine  trouble passing urine Side effects that usually do not require medical attention (report to your doctor or health care professional if they continue or are bothersome):  changes in emotions or mood  constipation  diarrhea  excessive hair growth on the face or body  headache  nausea, vomiting  pain, redness, or irritation at site where injected  trouble sleeping  weight gain This list may not describe all possible side effects. Call your doctor for medical advice about side effects. You may report side effects to FDA at 1-800-FDA-1088. Where should I keep my medicine? This drug is given in a hospital or clinic and will not be stored at home. NOTE: This sheet is a summary. It may not cover all possible information. If you have questions about this medicine, talk to your doctor, pharmacist, or health care provider.  2020 Elsevier/Gold Standard (2017-11-21 09:12:19)

## 2019-03-19 NOTE — Telephone Encounter (Signed)
Patient called to report he is spacing out his infusions to every other day because of it causes his A1C to increase. He said he wants Dr. Posey Pronto to be aware he's following her orders but with a day in between each treatment.

## 2019-03-19 NOTE — Telephone Encounter (Signed)
Austin Meadows from the [name] called because the patient is getting his Solumedrol and they are unsure how far apart these are to be scheduled. The OV note states:  Start solumedrol infusion 1g for 5 days.  Monitor your blood sugar on the days of your infusion.  Please advise

## 2019-03-20 ENCOUNTER — Ambulatory Visit: Payer: BC Managed Care – PPO | Admitting: Family

## 2019-03-20 NOTE — Telephone Encounter (Signed)
Noted, okay to do infusion every other day like that.

## 2019-03-20 NOTE — Telephone Encounter (Signed)
Pt called informed its ok to do infusions every other day. He stated that he is thankful for [name]. And is feeling good,

## 2019-03-23 ENCOUNTER — Ambulatory Visit (HOSPITAL_COMMUNITY)
Admission: RE | Admit: 2019-03-23 | Discharge: 2019-03-23 | Disposition: A | Discharge status: Home / Self Care | Payer: BC Managed Care – PPO | Source: Ambulatory Visit | Attending: Internal Medicine | Admitting: Internal Medicine

## 2019-03-23 ENCOUNTER — Other Ambulatory Visit: Payer: Self-pay

## 2019-03-23 DIAGNOSIS — G6181 Chronic inflammatory demyelinating polyneuritis: Secondary | ICD-10-CM | POA: Diagnosis not present

## 2019-03-23 MED ORDER — SODIUM CHLORIDE 0.9 % IV SOLN
1000.0000 mg | Freq: Once | INTRAVENOUS | Status: AC
  Administered 2019-03-23: 1000 mg via INTRAVENOUS
  Filled 2019-03-23: qty 8

## 2019-03-23 MED ORDER — SODIUM CHLORIDE 0.9 % IV SOLN: INTRAVENOUS | Status: DC | PRN

## 2019-03-23 NOTE — Discharge Instructions (Signed)
Methylprednisolone Solution for Injection What is this medicine? METHYLPREDNISOLONE (meth ill pred NISS oh lone) is a corticosteroid. It is commonly used to treat inflammation of the skin, joints, lungs, and other organs. Common conditions treated include asthma, allergies, and arthritis. It is also used for other conditions, such as blood disorders and diseases of the adrenal glands. This medicine may be used for other purposes; ask your health care provider or pharmacist if you have questions. COMMON BRAND NAME(S): A-Methapred, Solu-Medrol What should I tell my health care provider before I take this medicine? They need to know if you have any of these conditions:  Cushing's syndrome  eye disease, vision problems  diabetes  glaucoma  heart disease  high blood pressure  infection (especially a virus infection such as chickenpox, cold sores, or herpes)  liver disease  mental illness  myasthenia gravis  osteoporosis  recently received or scheduled to receive a vaccine  seizures  stomach or intestine problems  thyroid disease  an unusual or allergic reaction to lactose, methylprednisolone, other medicines, foods, dyes, or preservatives  pregnant or trying to get pregnant  breast-feeding How should I use this medicine? This medicine is for injection or infusion into a vein. It is also for injection into a muscle. It is given by a health care professional in a hospital or clinic setting. Talk to your pediatrician regarding the use of this medicine in children. While this drug may be prescribed for selected conditions, precautions do apply. Overdosage: If you think you have taken too much of this medicine contact a poison control center or emergency room at once. NOTE: This medicine is only for you. Do not share this medicine with others. What if I miss a dose? This does not apply. What may interact with this medicine? Do not take this medicine with any of the following  medications:  alefacept  echinacea  iopamidol  live virus vaccines  metyrapone  mifepristone This medicine may also interact with the following medications:  amphotericin B  aspirin and aspirin-like medicines  certain antibiotics like erythromycin, clarithromycin, troleandomycin  certain medicines for diabetes  certain medicines for fungal infection like ketoconazole  certain medicines for seizures like carbamazepine, phenobarbital, phenytoin  certain medicines that treat or prevent blood clots like warfarin  cyclosporine  digoxin  diuretics  male hormones, like estrogens and birth control pills  isoniazid  NSAIDS, medicines for pain and inflammation, like ibuprofen or naproxen  other medicines for myasthenia gravis  rifampin  vaccines This list may not describe all possible interactions. Give your health care provider a list of all the medicines, herbs, non-prescription drugs, or dietary supplements you use. Also tell them if you smoke, drink alcohol, or use illegal drugs. Some items may interact with your medicine. What should I watch for while using this medicine? Tell your doctor or healthcare professional if your symptoms do not start to get better or if they get worse. Do not stop taking except on your doctor's advice. You may develop a severe reaction. Your doctor will tell you how much medicine to take. Your condition will be monitored carefully while you are receiving this medicine. This medicine may increase your risk of getting an infection. Tell your doctor or health care professional if you are around anyone with measles or chickenpox, or if you develop sores or blisters that do not heal properly. This medicine may increase blood sugar. Ask your healthcare provider if changes in diet or medicines are needed if you have diabetes. Tell  your doctor or health care professional right away if you have any change in your eyesight. Using this medicine for a  long time may increase your risk of low bone mass. Talk to your doctor about bone health. What side effects may I notice from receiving this medicine? Side effects that you should report to your doctor or health care professional as soon as possible:  allergic reactions like skin rash, itching or hives, swelling of the face, lips, or tongue  bloody or tarry stools  hallucination, loss of contact with reality  muscle cramps  muscle pain  palpitations  signs and symptoms of high blood sugar such as being more thirsty or hungry or having to urinate more than normal. You may also feel very tired or have blurry vision.  signs and symptoms of infection like fever or chills; cough; sore throat; pain or trouble passing urine  trouble passing urine Side effects that usually do not require medical attention (report to your doctor or health care professional if they continue or are bothersome):  changes in emotions or mood  constipation  diarrhea  excessive hair growth on the face or body  headache  nausea, vomiting  pain, redness, or irritation at site where injected  trouble sleeping  weight gain This list may not describe all possible side effects. Call your doctor for medical advice about side effects. You may report side effects to FDA at 1-800-FDA-1088. Where should I keep my medicine? This drug is given in a hospital or clinic and will not be stored at home. NOTE: This sheet is a summary. It may not cover all possible information. If you have questions about this medicine, talk to your doctor, pharmacist, or health care provider.  2020 Elsevier/Gold Standard (2017-11-21 09:12:19)

## 2019-03-23 NOTE — Progress Notes (Signed)
                   Patient Care Center Note   Diagnosis:  Chronic inflammatory demyelinating polyradiculoneuropathy   Provider: Joella Prince, MD   Procedure: Solu-medrol infusion   Note: Patient received solu-medrol infusion via PIV. Tolerated well. Vitals signs are stable. Discharge instructions given. Alert,oriented and ambulatory at discharge.   Otho Bellows, RN

## 2019-03-25 ENCOUNTER — Other Ambulatory Visit: Payer: Self-pay

## 2019-03-25 ENCOUNTER — Ambulatory Visit (HOSPITAL_COMMUNITY)
Admission: RE | Admit: 2019-03-25 | Discharge: 2019-03-25 | Disposition: A | Discharge status: Home / Self Care | Payer: BC Managed Care – PPO | Source: Ambulatory Visit | Attending: Internal Medicine | Admitting: Internal Medicine

## 2019-03-25 DIAGNOSIS — G6181 Chronic inflammatory demyelinating polyneuritis: Secondary | ICD-10-CM | POA: Diagnosis not present

## 2019-03-25 MED ORDER — SODIUM CHLORIDE 0.9 % IV SOLN
INTRAVENOUS | Status: DC | PRN
  Administered 2019-03-25: 250 mL via INTRAVENOUS

## 2019-03-25 MED ORDER — SODIUM CHLORIDE 0.9 % IV SOLN
1000.0000 mg | Freq: Once | INTRAVENOUS | Status: AC
  Administered 2019-03-25: 1000 mg via INTRAVENOUS
  Filled 2019-03-25: qty 8

## 2019-03-25 NOTE — Discharge Instructions (Signed)
Methylprednisolone Solution for Injection What is this medicine? METHYLPREDNISOLONE (meth ill pred NISS oh lone) is a corticosteroid. It is commonly used to treat inflammation of the skin, joints, lungs, and other organs. Common conditions treated include asthma, allergies, and arthritis. It is also used for other conditions, such as blood disorders and diseases of the adrenal glands. This medicine may be used for other purposes; ask your health care provider or pharmacist if you have questions. COMMON BRAND NAME(S): A-Methapred, Solu-Medrol What should I tell my health care provider before I take this medicine? They need to know if you have any of these conditions:  Cushing's syndrome  eye disease, vision problems  diabetes  glaucoma  heart disease  high blood pressure  infection (especially a virus infection such as chickenpox, cold sores, or herpes)  liver disease  mental illness  myasthenia gravis  osteoporosis  recently received or scheduled to receive a vaccine  seizures  stomach or intestine problems  thyroid disease  an unusual or allergic reaction to lactose, methylprednisolone, other medicines, foods, dyes, or preservatives  pregnant or trying to get pregnant  breast-feeding How should I use this medicine? This medicine is for injection or infusion into a vein. It is also for injection into a muscle. It is given by a health care professional in a hospital or clinic setting. Talk to your pediatrician regarding the use of this medicine in children. While this drug may be prescribed for selected conditions, precautions do apply. Overdosage: If you think you have taken too much of this medicine contact a poison control center or emergency room at once. NOTE: This medicine is only for you. Do not share this medicine with others. What if I miss a dose? This does not apply. What may interact with this medicine? Do not take this medicine with any of the following  medications:  alefacept  echinacea  iopamidol  live virus vaccines  metyrapone  mifepristone This medicine may also interact with the following medications:  amphotericin B  aspirin and aspirin-like medicines  certain antibiotics like erythromycin, clarithromycin, troleandomycin  certain medicines for diabetes  certain medicines for fungal infection like ketoconazole  certain medicines for seizures like carbamazepine, phenobarbital, phenytoin  certain medicines that treat or prevent blood clots like warfarin  cyclosporine  digoxin  diuretics  male hormones, like estrogens and birth control pills  isoniazid  NSAIDS, medicines for pain and inflammation, like ibuprofen or naproxen  other medicines for myasthenia gravis  rifampin  vaccines This list may not describe all possible interactions. Give your health care provider a list of all the medicines, herbs, non-prescription drugs, or dietary supplements you use. Also tell them if you smoke, drink alcohol, or use illegal drugs. Some items may interact with your medicine. What should I watch for while using this medicine? Tell your doctor or healthcare professional if your symptoms do not start to get better or if they get worse. Do not stop taking except on your doctor's advice. You may develop a severe reaction. Your doctor will tell you how much medicine to take. Your condition will be monitored carefully while you are receiving this medicine. This medicine may increase your risk of getting an infection. Tell your doctor or health care professional if you are around anyone with measles or chickenpox, or if you develop sores or blisters that do not heal properly. This medicine may increase blood sugar. Ask your healthcare provider if changes in diet or medicines are needed if you have diabetes. Tell  your doctor or health care professional right away if you have any change in your eyesight. Using this medicine for a  long time may increase your risk of low bone mass. Talk to your doctor about bone health. What side effects may I notice from receiving this medicine? Side effects that you should report to your doctor or health care professional as soon as possible:  allergic reactions like skin rash, itching or hives, swelling of the face, lips, or tongue  bloody or tarry stools  hallucination, loss of contact with reality  muscle cramps  muscle pain  palpitations  signs and symptoms of high blood sugar such as being more thirsty or hungry or having to urinate more than normal. You may also feel very tired or have blurry vision.  signs and symptoms of infection like fever or chills; cough; sore throat; pain or trouble passing urine  trouble passing urine Side effects that usually do not require medical attention (report to your doctor or health care professional if they continue or are bothersome):  changes in emotions or mood  constipation  diarrhea  excessive hair growth on the face or body  headache  nausea, vomiting  pain, redness, or irritation at site where injected  trouble sleeping  weight gain This list may not describe all possible side effects. Call your doctor for medical advice about side effects. You may report side effects to FDA at 1-800-FDA-1088. Where should I keep my medicine? This drug is given in a hospital or clinic and will not be stored at home. NOTE: This sheet is a summary. It may not cover all possible information. If you have questions about this medicine, talk to your doctor, pharmacist, or health care provider.  2020 Elsevier/Gold Standard (2017-11-21 09:12:19)

## 2019-03-25 NOTE — Progress Notes (Signed)
PATIENT CARE CENTER NOTE  Diagnosis:  Chronic inflammatory demyelinating polyradiculoneuropathy   Provider: Joella Prince, MD   Procedure: Solu-medrol infusion   Note: Patient received third Solu-medrol infusion via PIV. Tolerated well with no adverse reaction. Vital signs stable. Discharge instructions given. Patient is to get a total of 5 doses.  Alert, oriented and ambulatory at discharge.

## 2019-03-27 ENCOUNTER — Other Ambulatory Visit: Payer: Self-pay

## 2019-03-27 ENCOUNTER — Ambulatory Visit (HOSPITAL_COMMUNITY)
Admission: RE | Admit: 2019-03-27 | Discharge: 2019-03-27 | Disposition: A | Discharge status: Home / Self Care | Payer: BC Managed Care – PPO | Source: Ambulatory Visit | Attending: Internal Medicine | Admitting: Internal Medicine

## 2019-03-27 DIAGNOSIS — G6181 Chronic inflammatory demyelinating polyneuritis: Secondary | ICD-10-CM | POA: Diagnosis not present

## 2019-03-27 MED ORDER — SODIUM CHLORIDE 0.9 % IV SOLN
INTRAVENOUS | Status: DC | PRN
  Administered 2019-03-27: 250 mL via INTRAVENOUS

## 2019-03-27 MED ORDER — SODIUM CHLORIDE 0.9 % IV SOLN
1000.0000 mg | Freq: Once | INTRAVENOUS | Status: AC
  Administered 2019-03-27: 1000 mg via INTRAVENOUS
  Filled 2019-03-27: qty 8

## 2019-03-27 NOTE — Progress Notes (Signed)
PATIENT CARE CENTER NOTE  Diagnosis:Chronic inflammatory demyelinating polyradiculoneuropathy   Provider:Patel, Donkia, MD   Procedure:Solu-medrol infusion   Note:Patient received fourth Solu-medrol infusion via PIV. Tolerated well with no adverse reaction. Vital signs stable. Discharge instructions given. Patient is to get a total of 5 doses.  Alert, oriented and ambulatory at discharge.

## 2019-03-27 NOTE — Discharge Instructions (Signed)
Methylprednisolone Solution for Injection What is this medicine? METHYLPREDNISOLONE (meth ill pred NISS oh lone) is a corticosteroid. It is commonly used to treat inflammation of the skin, joints, lungs, and other organs. Common conditions treated include asthma, allergies, and arthritis. It is also used for other conditions, such as blood disorders and diseases of the adrenal glands. This medicine may be used for other purposes; ask your health care provider or pharmacist if you have questions. COMMON BRAND NAME(S): A-Methapred, Solu-Medrol What should I tell my health care provider before I take this medicine? They need to know if you have any of these conditions:  Cushing's syndrome  eye disease, vision problems  diabetes  glaucoma  heart disease  high blood pressure  infection (especially a virus infection such as chickenpox, cold sores, or herpes)  liver disease  mental illness  myasthenia gravis  osteoporosis  recently received or scheduled to receive a vaccine  seizures  stomach or intestine problems  thyroid disease  an unusual or allergic reaction to lactose, methylprednisolone, other medicines, foods, dyes, or preservatives  pregnant or trying to get pregnant  breast-feeding How should I use this medicine? This medicine is for injection or infusion into a vein. It is also for injection into a muscle. It is given by a health care professional in a hospital or clinic setting. Talk to your pediatrician regarding the use of this medicine in children. While this drug may be prescribed for selected conditions, precautions do apply. Overdosage: If you think you have taken too much of this medicine contact a poison control center or emergency room at once. NOTE: This medicine is only for you. Do not share this medicine with others. What if I miss a dose? This does not apply. What may interact with this medicine? Do not take this medicine with any of the following  medications:  alefacept  echinacea  iopamidol  live virus vaccines  metyrapone  mifepristone This medicine may also interact with the following medications:  amphotericin B  aspirin and aspirin-like medicines  certain antibiotics like erythromycin, clarithromycin, troleandomycin  certain medicines for diabetes  certain medicines for fungal infection like ketoconazole  certain medicines for seizures like carbamazepine, phenobarbital, phenytoin  certain medicines that treat or prevent blood clots like warfarin  cyclosporine  digoxin  diuretics  male hormones, like estrogens and birth control pills  isoniazid  NSAIDS, medicines for pain and inflammation, like ibuprofen or naproxen  other medicines for myasthenia gravis  rifampin  vaccines This list may not describe all possible interactions. Give your health care provider a list of all the medicines, herbs, non-prescription drugs, or dietary supplements you use. Also tell them if you smoke, drink alcohol, or use illegal drugs. Some items may interact with your medicine. What should I watch for while using this medicine? Tell your doctor or healthcare professional if your symptoms do not start to get better or if they get worse. Do not stop taking except on your doctor's advice. You may develop a severe reaction. Your doctor will tell you how much medicine to take. Your condition will be monitored carefully while you are receiving this medicine. This medicine may increase your risk of getting an infection. Tell your doctor or health care professional if you are around anyone with measles or chickenpox, or if you develop sores or blisters that do not heal properly. This medicine may increase blood sugar. Ask your healthcare provider if changes in diet or medicines are needed if you have diabetes. Tell  your doctor or health care professional right away if you have any change in your eyesight. Using this medicine for a  long time may increase your risk of low bone mass. Talk to your doctor about bone health. What side effects may I notice from receiving this medicine? Side effects that you should report to your doctor or health care professional as soon as possible:  allergic reactions like skin rash, itching or hives, swelling of the face, lips, or tongue  bloody or tarry stools  hallucination, loss of contact with reality  muscle cramps  muscle pain  palpitations  signs and symptoms of high blood sugar such as being more thirsty or hungry or having to urinate more than normal. You may also feel very tired or have blurry vision.  signs and symptoms of infection like fever or chills; cough; sore throat; pain or trouble passing urine  trouble passing urine Side effects that usually do not require medical attention (report to your doctor or health care professional if they continue or are bothersome):  changes in emotions or mood  constipation  diarrhea  excessive hair growth on the face or body  headache  nausea, vomiting  pain, redness, or irritation at site where injected  trouble sleeping  weight gain This list may not describe all possible side effects. Call your doctor for medical advice about side effects. You may report side effects to FDA at 1-800-FDA-1088. Where should I keep my medicine? This drug is given in a hospital or clinic and will not be stored at home. NOTE: This sheet is a summary. It may not cover all possible information. If you have questions about this medicine, talk to your doctor, pharmacist, or health care provider.  2020 Elsevier/Gold Standard (2017-11-21 09:12:19)

## 2019-03-30 ENCOUNTER — Telehealth: Payer: BC Managed Care – PPO | Admitting: Neurology

## 2019-04-01 ENCOUNTER — Ambulatory Visit (HOSPITAL_COMMUNITY)
Admission: RE | Admit: 2019-04-01 | Discharge: 2019-04-01 | Disposition: A | Discharge status: Home / Self Care | Payer: BC Managed Care – PPO | Source: Ambulatory Visit | Attending: Internal Medicine | Admitting: Internal Medicine

## 2019-04-01 ENCOUNTER — Other Ambulatory Visit: Payer: Self-pay

## 2019-04-01 DIAGNOSIS — G6181 Chronic inflammatory demyelinating polyneuritis: Secondary | ICD-10-CM | POA: Diagnosis not present

## 2019-04-01 MED ORDER — SODIUM CHLORIDE 0.9 % IV SOLN
INTRAVENOUS | Status: DC | PRN
  Administered 2019-04-01: 250 mL via INTRAVENOUS

## 2019-04-01 MED ORDER — SODIUM CHLORIDE 0.9 % IV SOLN
1000.0000 mg | Freq: Once | INTRAVENOUS | Status: AC
  Administered 2019-04-01: 1000 mg via INTRAVENOUS
  Filled 2019-04-01: qty 8

## 2019-04-01 NOTE — Progress Notes (Signed)
PATIENT CARE CENTER NOTE  Diagnosis:Chronic inflammatory demyelinating polyradiculoneuropathy   Provider:Patel, Donkia, MD   Procedure:Solu-medrol infusion   Note:Patient receivedfifth and final Solu-medrol infusion via PIV. Tolerated well with no adverse reaction. Vital signs stable. Discharge instructions given. Alert, oriented and ambulatory at discharge.

## 2019-04-01 NOTE — Discharge Instructions (Signed)
Methylprednisolone Solution for Injection What is this medicine? METHYLPREDNISOLONE (meth ill pred NISS oh lone) is a corticosteroid. It is commonly used to treat inflammation of the skin, joints, lungs, and other organs. Common conditions treated include asthma, allergies, and arthritis. It is also used for other conditions, such as blood disorders and diseases of the adrenal glands. This medicine may be used for other purposes; ask your health care provider or pharmacist if you have questions. COMMON BRAND NAME(S): A-Methapred, Solu-Medrol What should I tell my health care provider before I take this medicine? They need to know if you have any of these conditions:  Cushing's syndrome  eye disease, vision problems  diabetes  glaucoma  heart disease  high blood pressure  infection (especially a virus infection such as chickenpox, cold sores, or herpes)  liver disease  mental illness  myasthenia gravis  osteoporosis  recently received or scheduled to receive a vaccine  seizures  stomach or intestine problems  thyroid disease  an unusual or allergic reaction to lactose, methylprednisolone, other medicines, foods, dyes, or preservatives  pregnant or trying to get pregnant  breast-feeding How should I use this medicine? This medicine is for injection or infusion into a vein. It is also for injection into a muscle. It is given by a health care professional in a hospital or clinic setting. Talk to your pediatrician regarding the use of this medicine in children. While this drug may be prescribed for selected conditions, precautions do apply. Overdosage: If you think you have taken too much of this medicine contact a poison control center or emergency room at once. NOTE: This medicine is only for you. Do not share this medicine with others. What if I miss a dose? This does not apply. What may interact with this medicine? Do not take this medicine with any of the following  medications:  alefacept  echinacea  iopamidol  live virus vaccines  metyrapone  mifepristone This medicine may also interact with the following medications:  amphotericin B  aspirin and aspirin-like medicines  certain antibiotics like erythromycin, clarithromycin, troleandomycin  certain medicines for diabetes  certain medicines for fungal infection like ketoconazole  certain medicines for seizures like carbamazepine, phenobarbital, phenytoin  certain medicines that treat or prevent blood clots like warfarin  cyclosporine  digoxin  diuretics  male hormones, like estrogens and birth control pills  isoniazid  NSAIDS, medicines for pain and inflammation, like ibuprofen or naproxen  other medicines for myasthenia gravis  rifampin  vaccines This list may not describe all possible interactions. Give your health care provider a list of all the medicines, herbs, non-prescription drugs, or dietary supplements you use. Also tell them if you smoke, drink alcohol, or use illegal drugs. Some items may interact with your medicine. What should I watch for while using this medicine? Tell your doctor or healthcare professional if your symptoms do not start to get better or if they get worse. Do not stop taking except on your doctor's advice. You may develop a severe reaction. Your doctor will tell you how much medicine to take. Your condition will be monitored carefully while you are receiving this medicine. This medicine may increase your risk of getting an infection. Tell your doctor or health care professional if you are around anyone with measles or chickenpox, or if you develop sores or blisters that do not heal properly. This medicine may increase blood sugar. Ask your healthcare provider if changes in diet or medicines are needed if you have diabetes. Tell  your doctor or health care professional right away if you have any change in your eyesight. Using this medicine for a  long time may increase your risk of low bone mass. Talk to your doctor about bone health. What side effects may I notice from receiving this medicine? Side effects that you should report to your doctor or health care professional as soon as possible:  allergic reactions like skin rash, itching or hives, swelling of the face, lips, or tongue  bloody or tarry stools  hallucination, loss of contact with reality  muscle cramps  muscle pain  palpitations  signs and symptoms of high blood sugar such as being more thirsty or hungry or having to urinate more than normal. You may also feel very tired or have blurry vision.  signs and symptoms of infection like fever or chills; cough; sore throat; pain or trouble passing urine  trouble passing urine Side effects that usually do not require medical attention (report to your doctor or health care professional if they continue or are bothersome):  changes in emotions or mood  constipation  diarrhea  excessive hair growth on the face or body  headache  nausea, vomiting  pain, redness, or irritation at site where injected  trouble sleeping  weight gain This list may not describe all possible side effects. Call your doctor for medical advice about side effects. You may report side effects to FDA at 1-800-FDA-1088. Where should I keep my medicine? This drug is given in a hospital or clinic and will not be stored at home. NOTE: This sheet is a summary. It may not cover all possible information. If you have questions about this medicine, talk to your doctor, pharmacist, or health care provider.  2020 Elsevier/Gold Standard (2017-11-21 09:12:19)

## 2019-04-03 ENCOUNTER — Encounter: Payer: Self-pay | Admitting: *Deleted

## 2019-04-10 ENCOUNTER — Telehealth: Payer: BC Managed Care – PPO | Admitting: Neurology

## 2019-04-13 ENCOUNTER — Encounter: Payer: Self-pay | Admitting: Family

## 2019-04-13 ENCOUNTER — Other Ambulatory Visit: Payer: Self-pay

## 2019-04-13 ENCOUNTER — Ambulatory Visit: Payer: BC Managed Care – PPO | Admitting: Family

## 2019-04-13 VITALS — BP 138/80 | HR 84 | Temp 98.2°F | Ht 71.0 in | Wt 213.2 lb

## 2019-04-13 DIAGNOSIS — L739 Follicular disorder, unspecified: Secondary | ICD-10-CM

## 2019-04-13 MED ORDER — DOXYCYCLINE HYCLATE 100 MG PO TABS: 100.0000 mg | ORAL_TABLET | Freq: Two times a day (BID) | ORAL | 0 refills | Status: DC

## 2019-04-13 NOTE — Progress Notes (Signed)
Austin Meadows is a 59 y.o. male with the following history as recorded in [employer]:  Patient Active Problem List   Diagnosis Date Noted  . Gastroesophageal reflux disease 11/28/2018  . Erectile dysfunction 12/12/2017  . Hypercholesteremia 12/12/2017  . Routine general medical examination at a health care facility 04/12/2017  . 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 CIDP (chronic inflammatory demyelinating polyneuropathy) ([place]) 04/11/2015  . Type 2 diabetes mellitus ([place]) 06/24/2014  . Family history of colon cancer 06/09/2014    Current Outpatient Medications  Medication Sig Dispense Refill  . atorvastatin (LIPITOR) 20 MG tablet Take 1 tablet (20 mg total) by mouth daily. 90 tablet 3  . blood glucose meter kit and supplies KIT Dispense based on patient and insurance preference. Use up to four times daily as directed. (FOR ICD-9 250.00, 250.01). 1 each 0  . Lancets MISC Use to test blood sugars 2 times daily. 100 each 3  . metFORMIN (GLUCOPHAGE-XR) 500 MG 24 hr tablet Take 2 tablets (1,000 mg total) by mouth 2 (two) times daily. (Patient taking differently: Take 1,000 mg by mouth 2 (two) times daily. Per patient, is taking 2 in the am and 1 in the pm;) 360 tablet 1  . omeprazole (PRILOSEC) 40 MG capsule Take 1 capsule (40 mg total) by mouth daily. 90 capsule 3  . ONETOUCH ULTRA test strip USE TO CHECK GLUCOSE UP TO 4 TIMES DAILY AS DIRECTED    . doxycycline (VIBRA-TABS) 100 MG tablet Take 1 tablet (100 mg total) by mouth 2 (two) times daily. 20 tablet 0  . gabapentin (NEURONTIN) 300 MG capsule Take 1 capsule (300 mg total) by mouth 2 (two) times daily. (Patient not taking: Reported on 04/13/2019) 60 capsule 2  . meloxicam (MOBIC) 15 MG tablet Take 1 tablet (15 mg total) by mouth daily as needed for pain. Follow-up appt due in May must see provider for future refills (Patient not taking: Reported on 04/03/2019) 30 tablet 0   No current facility-administered medications for this visit.     Allergies: Influenza vaccines and Penicillins  Past Medical History:  Diagnosis Date  . Chicken pox   . Diabetes mellitus without complication ([place])    type 2  . Hemorrhoid   . Hyperlipidemia   . Jaundice of newborn   . Rheumatic fever   . UTI (lower urinary tract infection)     Past Surgical History:  Procedure Laterality Date  . COLONOSCOPY  ages [age]   done [facility](pt unable to get records)    Family History  Problem Relation Age of Onset  . Arthritis Mother   . Cancer Mother        bone cancer  . Colon cancer Father 62  . Prostate cancer Father   . Heart disease Father   . Hypertension Father   . Diabetes Father   . Colon cancer Brother 52       late 29's when dx  . Cancer Brother        another cancer METS to brain  . Colon cancer Brother 78  . Colon cancer Other 65       Bill's daughter  . Healthy Son        x 4  . Healthy Daughter        x 1    Social History   Tobacco Use  . Smoking status: Former Smoker    Quit date: 06/04/1994    Years since quitting: 24.8  .  Smokeless tobacco: Never Used  Substance Use Topics  . Alcohol use: Not Currently    Alcohol/week: 0.0 standard drinks    Comment: Occasionally (one drink per week).  Heavier drinking in the [employer] for ~ 8 years.     Subjective:  Patient presents with concerns for "rash" on his back; notes his wife commented on the "bumps" last night and was afraid he had chicken pox; denies any itching; denies any new soaps, foods, detergents or medications.     Objective:  Vitals:   04/13/19 1140  BP: 138/80  Pulse: 84  Temp: 98.2 F (36.8 C)  TempSrc: Oral  SpO2: 98%  Weight: 213 lb 3.2 oz (96.7 kg)  Height: '5\' 11"'  (1.803 m)    General: Well developed, well nourished, in no acute distress  Skin : Warm and dry. Scattered pustular lesions noted on patient's back;  Head: Normocephalic and atraumatic  Lungs: Respirations unlabored;   Neurologic: Alert and  oriented; speech intact; face symmetrical;   Assessment:  1. Folliculitis     Plan:  Suspect staph infection; Rx for Doxycyline 100 mg bid x 10 days;  Keep planned follow-up at end of March for diabetes follow-up;  This visit occurred during the SARS-CoV-2 public health emergency.  Safety protocols were in place, including screening questions prior to the visit, additional usage of staff PPE, and extensive cleaning of exam room while observing appropriate contact time as indicated for disinfecting solutions.      No follow-ups on file.  No orders of the defined types were placed in this encounter.   Requested Prescriptions   Signed Prescriptions Disp Refills  . doxycycline (VIBRA-TABS) 100 MG tablet 20 tablet 0    Sig: Take 1 tablet (100 mg total) by mouth 2 (two) times daily.

## 2019-04-15 ENCOUNTER — Encounter: Payer: Self-pay | Admitting: Neurology

## 2019-04-16 ENCOUNTER — Telehealth (HOSPITAL_COMMUNITY): Payer: Self-pay | Admitting: General Practice

## 2019-04-16 ENCOUNTER — Other Ambulatory Visit: Payer: Self-pay

## 2019-04-16 ENCOUNTER — Telehealth (INDEPENDENT_AMBULATORY_CARE_PROVIDER_SITE_OTHER): Payer: BC Managed Care – PPO | Admitting: Neurology

## 2019-04-16 ENCOUNTER — Other Ambulatory Visit (HOSPITAL_COMMUNITY): Payer: Self-pay | Admitting: *Deleted

## 2019-04-16 DIAGNOSIS — G6181 Chronic inflammatory demyelinating polyneuritis: Secondary | ICD-10-CM

## 2019-04-16 MED ORDER — METHYLPREDNISOLONE SODIUM SUCC 1000 MG IJ SOLR: 1000.0000 mg | INTRAMUSCULAR | Status: DC

## 2019-04-16 NOTE — Telephone Encounter (Signed)
Courtney from D. Patel MD's office called. Wanted to know if RN can see the signed and held order for this patient. Loma Sousa will ask patient to schedule the appointments, that way RN can confirm if the orders can be seen or not. RN to call Loma Sousa back to confirm as soon as patient is scheduled for the infusions.

## 2019-04-16 NOTE — Progress Notes (Signed)
Pending call back from Patient Austin Meadows when they can view order

## 2019-04-16 NOTE — Addendum Note (Signed)
Addended by: Drucilla Schmidt on: 04/16/2019 09:13 AM   Modules accepted: Orders

## 2019-04-16 NOTE — Progress Notes (Signed)
   Virtual Visit via Video Note The purpose of this virtual visit is to provide medical care while limiting exposure to the novel coronavirus.    Consent was obtained for video visit:  Yes.   Answered questions that patient had about telehealth interaction:  Yes.   I discussed the limitations, risks, security and privacy concerns of performing an evaluation and management service by telemedicine. I also discussed with the patient that there may be a patient responsible charge related to this service. The patient expressed understanding and agreed to proceed.  Pt location: Home Physician Location: office Name of referring provider:  Marrian Salvage,* I connected with Roderic Palau at patients initiation/request on 04/16/2019 at  8:50 AM EST by video enabled telemedicine application and verified that I am speaking with the correct person using two identifiers. Pt MRN:  [id number] Pt DOB:  [birth year] Video Participants:  Roderic Palau;    History of Present Illness: This is a 59 y.o. male returning for follow-up of CIDP.  He completed Solumedrol infusions in January and reports marked improvement within the first infusion. He no longer has tingling/numbness involving the face or arms. He continues to have numbness over the soles and toes and sometimes also gets burning sensation. He still has some imbalance and is careful when walking.  He has not fallen, but has tripped a few times.    Observations/Objective:   There were no vitals filed for this visit. Patient is awake, alert, and appears comfortable.  Oriented x 4.   Face is symmetric.  Speech is not dysarthric.  Antigravity in all extremities.  No pronator drift. Gait appears normal, tandem gait intact.  He is able to stand on heels and toes, with mild unsteadiness.   Assessment and Plan:  Chronic inflammatory demyelinating polyradiculoneuropathy (2017), very well-controlled until November 2020 when he had relapse of symptoms.  He was  treated with IVMP 1g x 5 doses in January which has markedly improved paresthesias over the face and arms, but he continues to have residual numbness in the feet and mild imbalance. Patient informed that sensory nerves to the feet tend to take the longest to recover, and sometimes, there may be lasting residual deficits.  Continue Solumedrol 1g every 28 days x 2 cycles Continue gabapentin 300mg  [employer] FMLA paperwork will be completed, he would like to return to work on 2/15 with the only restriction being that he cannot climb ladders.   Follow Up Instructions:   I discussed the assessment and treatment plan with the patient. The patient was provided an opportunity to ask questions and all were answered. The patient agreed with the plan and demonstrated an understanding of the instructions.   The patient was advised to call back or seek an in-person evaluation if the symptoms worsen or if the condition fails to improve as anticipated.  Follow-up in April   Alda Berthold, DO

## 2019-04-16 NOTE — Progress Notes (Signed)
Verified that they can see order. Patient aware to call and schedule

## 2019-04-17 ENCOUNTER — Telehealth: Payer: Self-pay | Admitting: Neurology

## 2019-04-17 DIAGNOSIS — Z0279 Encounter for issue of other medical certificate: Secondary | ICD-10-CM

## 2019-04-17 NOTE — Telephone Encounter (Signed)
Patient called to check on the status of his FMLA forms. He said he is due to return to work on Monday, 04/19/18 and needs the forms as soon as possible.

## 2019-04-17 NOTE — Telephone Encounter (Signed)
Patient aware forms are ready and will come and pick them up.

## 2019-04-17 NOTE — Telephone Encounter (Signed)
Dr Posey Pronto has forms and is working on them.

## 2019-04-28 ENCOUNTER — Telehealth: Payer: Self-pay | Admitting: Nurse Practitioner

## 2019-04-28 NOTE — Telephone Encounter (Signed)
Pt was called and reminded of his appointment

## 2019-04-29 ENCOUNTER — Other Ambulatory Visit: Payer: Self-pay

## 2019-04-29 ENCOUNTER — Ambulatory Visit (HOSPITAL_COMMUNITY)
Admission: RE | Admit: 2019-04-29 | Discharge: 2019-04-29 | Disposition: A | Discharge status: Home / Self Care | Payer: BC Managed Care – PPO | Source: Ambulatory Visit | Attending: Internal Medicine | Admitting: Internal Medicine

## 2019-04-29 DIAGNOSIS — G6181 Chronic inflammatory demyelinating polyneuritis: Secondary | ICD-10-CM | POA: Diagnosis not present

## 2019-04-29 MED ORDER — SODIUM CHLORIDE 0.9 % IV SOLN
1000.0000 mg | Freq: Once | INTRAVENOUS | Status: AC
  Administered 2019-04-29: 1000 mg via INTRAVENOUS
  Filled 2019-04-29: qty 8

## 2019-04-29 MED ORDER — SODIUM CHLORIDE 0.9 % IV SOLN
INTRAVENOUS | Status: DC | PRN
  Administered 2019-04-29: 250 mL via INTRAVENOUS

## 2019-04-29 NOTE — Progress Notes (Signed)
Patient to come back for the second dose of Solu-medrol in 28 days. Patient informed, verbalized understanding.

## 2019-04-29 NOTE — Progress Notes (Signed)
Pt discharged home from IV infusion therapy. Pt A&Ox4, ambulatory, reviewed discharge instructions with patient, all questions answered, pt understands without assistance.

## 2019-05-09 ENCOUNTER — Ambulatory Visit: Payer: BC Managed Care – PPO | Attending: Internal Medicine

## 2019-05-09 DIAGNOSIS — Z23 Encounter for immunization: Secondary | ICD-10-CM | POA: Insufficient documentation

## 2019-05-09 NOTE — Progress Notes (Signed)
   Covid-19 Vaccination Clinic  Name:  Austin Meadows    MRN: [id number] DOB: 1960/05/23  05/09/2019  Austin Meadows was observed post Covid-19 immunization for 30 minutes based on pre-vaccination screening without incident. He was provided with Vaccine Information Sheet and instruction to access the V-Safe system.   Austin Meadows was instructed to call 911 with any severe reactions post vaccine: [name] Kitchen Difficulty breathing  . Swelling of face and throat  . A fast heartbeat  . A bad rash all over body  . Dizziness and weakness   Immunizations Administered    Name Date Dose VIS Date Route   Pfizer COVID-19 Vaccine 05/09/2019 12:25 PM 0.3 mL 02/13/2019 Intramuscular   Manufacturer: [place]   Lot: [id number]   [place]: [id number]

## 2019-05-22 ENCOUNTER — Other Ambulatory Visit: Payer: Self-pay

## 2019-05-22 ENCOUNTER — Ambulatory Visit (HOSPITAL_COMMUNITY)
Admission: RE | Admit: 2019-05-22 | Discharge: 2019-05-22 | Disposition: A | Discharge status: Home / Self Care | Payer: BC Managed Care – PPO | Source: Ambulatory Visit | Attending: Internal Medicine | Admitting: Internal Medicine

## 2019-05-22 DIAGNOSIS — G6181 Chronic inflammatory demyelinating polyneuritis: Secondary | ICD-10-CM | POA: Insufficient documentation

## 2019-05-22 MED ORDER — SODIUM CHLORIDE 0.9 % IV SOLN
INTRAVENOUS | Status: DC | PRN
  Administered 2019-05-22: 250 mL via INTRAVENOUS

## 2019-05-22 MED ORDER — SODIUM CHLORIDE 0.9 % IV SOLN
1000.0000 mg | Freq: Once | INTRAVENOUS | Status: AC
  Administered 2019-05-22: 1000 mg via INTRAVENOUS
  Filled 2019-05-22: qty 8

## 2019-05-22 NOTE — Progress Notes (Signed)
PATIENT CARE CENTER NOTE  Diagnosis:Chronic inflammatory demyelinating polyradiculoneuropathy   Provider:Patel, Ronita Hipps, MD   Procedure:Solu-medrol infusion   Note:Patient receivedSolu-medrol infusion via PIV. Tolerated well with no adverse reaction. Vital signs stable. Discharge instructions given. Alert, oriented and ambulatory at discharge.

## 2019-05-22 NOTE — Discharge Instructions (Signed)
Methylprednisolone Solution for Injection What is this medicine? METHYLPREDNISOLONE (meth ill pred NISS oh lone) is a corticosteroid. It is commonly used to treat inflammation of the skin, joints, lungs, and other organs. Common conditions treated include asthma, allergies, and arthritis. It is also used for other conditions, such as blood disorders and diseases of the adrenal glands. This medicine may be used for other purposes; ask your health care provider or pharmacist if you have questions. COMMON BRAND NAME(S): A-Methapred, Solu-Medrol What should I tell my health care provider before I take this medicine? They need to know if you have any of these conditions:  Cushing's syndrome  eye disease, vision problems  diabetes  glaucoma  heart disease  high blood pressure  infection (especially a virus infection such as chickenpox, cold sores, or herpes)  liver disease  mental illness  myasthenia gravis  osteoporosis  recently received or scheduled to receive a vaccine  seizures  stomach or intestine problems  thyroid disease  an unusual or allergic reaction to lactose, methylprednisolone, other medicines, foods, dyes, or preservatives  pregnant or trying to get pregnant  breast-feeding How should I use this medicine? This medicine is for injection or infusion into a vein. It is also for injection into a muscle. It is given by a health care professional in a hospital or clinic setting. Talk to your pediatrician regarding the use of this medicine in children. While this drug may be prescribed for selected conditions, precautions do apply. Overdosage: If you think you have taken too much of this medicine contact a poison control center or emergency room at once. NOTE: This medicine is only for you. Do not share this medicine with others. What if I miss a dose? This does not apply. What may interact with this medicine? Do not take this medicine with any of the following  medications:  alefacept  echinacea  iopamidol  live virus vaccines  metyrapone  mifepristone This medicine may also interact with the following medications:  amphotericin B  aspirin and aspirin-like medicines  certain antibiotics like erythromycin, clarithromycin, troleandomycin  certain medicines for diabetes  certain medicines for fungal infection like ketoconazole  certain medicines for seizures like carbamazepine, phenobarbital, phenytoin  certain medicines that treat or prevent blood clots like warfarin  cyclosporine  digoxin  diuretics  male hormones, like estrogens and birth control pills  isoniazid  NSAIDS, medicines for pain and inflammation, like ibuprofen or naproxen  other medicines for myasthenia gravis  rifampin  vaccines This list may not describe all possible interactions. Give your health care provider a list of all the medicines, herbs, non-prescription drugs, or dietary supplements you use. Also tell them if you smoke, drink alcohol, or use illegal drugs. Some items may interact with your medicine. What should I watch for while using this medicine? Tell your doctor or healthcare professional if your symptoms do not start to get better or if they get worse. Do not stop taking except on your doctor's advice. You may develop a severe reaction. Your doctor will tell you how much medicine to take. Your condition will be monitored carefully while you are receiving this medicine. This medicine may increase your risk of getting an infection. Tell your doctor or health care professional if you are around anyone with measles or chickenpox, or if you develop sores or blisters that do not heal properly. This medicine may increase blood sugar. Ask your healthcare provider if changes in diet or medicines are needed if you have diabetes. Tell  your doctor or health care professional right away if you have any change in your eyesight. Using this medicine for a  long time may increase your risk of low bone mass. Talk to your doctor about bone health. What side effects may I notice from receiving this medicine? Side effects that you should report to your doctor or health care professional as soon as possible:  allergic reactions like skin rash, itching or hives, swelling of the face, lips, or tongue  bloody or tarry stools  hallucination, loss of contact with reality  muscle cramps  muscle pain  palpitations  signs and symptoms of high blood sugar such as being more thirsty or hungry or having to urinate more than normal. You may also feel very tired or have blurry vision.  signs and symptoms of infection like fever or chills; cough; sore throat; pain or trouble passing urine  trouble passing urine Side effects that usually do not require medical attention (report to your doctor or health care professional if they continue or are bothersome):  changes in emotions or mood  constipation  diarrhea  excessive hair growth on the face or body  headache  nausea, vomiting  pain, redness, or irritation at site where injected  trouble sleeping  weight gain This list may not describe all possible side effects. Call your doctor for medical advice about side effects. You may report side effects to FDA at 1-800-FDA-1088. Where should I keep my medicine? This drug is given in a hospital or clinic and will not be stored at home. NOTE: This sheet is a summary. It may not cover all possible information. If you have questions about this medicine, talk to your doctor, pharmacist, or health care provider.  2020 Elsevier/Gold Standard (2017-11-21 09:12:19)

## 2019-05-29 ENCOUNTER — Ambulatory Visit: Payer: BC Managed Care – PPO | Admitting: Family

## 2019-05-30 ENCOUNTER — Ambulatory Visit: Payer: BC Managed Care – PPO | Attending: Internal Medicine

## 2019-05-30 DIAGNOSIS — Z23 Encounter for immunization: Secondary | ICD-10-CM

## 2019-05-30 NOTE — Progress Notes (Signed)
   Covid-19 Vaccination Clinic  Name:  Canio Contreraz    MRN: [id number] DOB: 03-07-1960  05/30/2019  Mr. Barrueta was observed post Covid-19 immunization for 15 minutes without incident. He was provided with Vaccine Information Sheet and instruction to access the V-Safe system.   Mr. Pasternack was instructed to call 911 with any severe reactions post vaccine: [name] Kitchen Difficulty breathing  . Swelling of face and throat  . A fast heartbeat  . A bad rash all over body  . Dizziness and weakness   Immunizations Administered    Name Date Dose VIS Date Route   Pfizer COVID-19 Vaccine 05/30/2019  8:19 AM 0.3 mL 02/13/2019 Intramuscular   Manufacturer: [place]   Lot: [id number]   [place]: [id number]

## 2019-06-02 ENCOUNTER — Other Ambulatory Visit: Payer: Self-pay

## 2019-06-02 ENCOUNTER — Ambulatory Visit: Payer: BC Managed Care – PPO | Admitting: Family

## 2019-06-02 ENCOUNTER — Ambulatory Visit (INDEPENDENT_AMBULATORY_CARE_PROVIDER_SITE_OTHER): Payer: BC Managed Care – PPO

## 2019-06-02 ENCOUNTER — Encounter: Payer: Self-pay | Admitting: Family

## 2019-06-02 VITALS — BP 142/88 | HR 78 | Temp 98.0°F | Ht 71.0 in | Wt 215.2 lb

## 2019-06-02 DIAGNOSIS — E119 Type 2 diabetes mellitus without complications: Secondary | ICD-10-CM | POA: Diagnosis not present

## 2019-06-02 DIAGNOSIS — K6289 Other specified diseases of anus and rectum: Secondary | ICD-10-CM

## 2019-06-02 DIAGNOSIS — M533 Sacrococcygeal disorders, not elsewhere classified: Secondary | ICD-10-CM

## 2019-06-02 LAB — COMPREHENSIVE METABOLIC PANEL
ALT: 20 U/L (ref 0–53)
AST: 13 U/L (ref 0–37)
Albumin: 4.4 g/dL (ref 3.5–5.2)
Alkaline Phosphatase: 54 U/L (ref 39–117)
BUN: 19 mg/dL (ref 6–23)
CO2: 32 mEq/L (ref 19–32)
Calcium: 9.4 mg/dL (ref 8.4–10.5)
Chloride: 102 mEq/L (ref 96–112)
Creatinine, Ser: 0.85 mg/dL (ref 0.40–1.50)
GFR: 112.56 mL/min (ref >60.00)
Glucose, Bld: 154 mg/dL — ABNORMAL HIGH (ref 70–99)
Potassium: 4.4 mEq/L (ref 3.5–5.1)
Sodium: 137 mEq/L (ref 135–145)
Total Bilirubin: 0.5 mg/dL (ref 0.2–1.2)
Total Protein: 7.1 g/dL (ref 6.0–8.3)

## 2019-06-02 LAB — CBC WITH DIFFERENTIAL/PLATELET
Basophils Absolute: 0.1 10*3/uL (ref 0.0–0.1)
Basophils Relative: 1 % (ref 0.0–3.0)
Eosinophils Absolute: 0.2 10*3/uL (ref 0.0–0.7)
Eosinophils Relative: 2.9 % (ref 0.0–5.0)
HCT: 42.8 % (ref 39.0–52.0)
Hemoglobin: 14 g/dL (ref 13.0–17.0)
Lymphocytes Relative: 23.1 % (ref 12.0–46.0)
Lymphs Abs: 1.5 10*3/uL (ref 0.7–4.0)
MCHC: 32.6 g/dL (ref 30.0–36.0)
MCV: 85.2 fl (ref 78.0–100.0)
Monocytes Absolute: 0.7 10*3/uL (ref 0.1–1.0)
Monocytes Relative: 10.5 % (ref 3.0–12.0)
Neutro Abs: 4.1 10*3/uL (ref 1.4–7.7)
Neutrophils Relative %: 62.5 % (ref 43.0–77.0)
Platelets: 219 10*3/uL (ref 150.0–400.0)
RBC: 5.02 Mil/uL (ref 4.22–5.81)
RDW: 14.8 % (ref 11.5–15.5)
WBC: 6.6 10*3/uL (ref 4.0–10.5)

## 2019-06-02 LAB — HEMOGLOBIN A1C: Hgb A1c MFr Bld: 8 % — ABNORMAL HIGH (ref 4.6–6.5)

## 2019-06-02 MED ORDER — PROCTOFOAM HC 1-1 % EX FOAM: 1.0000 | Freq: Three times a day (TID) | CUTANEOUS | 0 refills | Status: DC

## 2019-06-02 NOTE — Progress Notes (Signed)
Austin Meadows is a 59 y.o. male with the following history as recorded in EpicCare:  Patient Active Problem List   Diagnosis Date Noted  . Gastroesophageal reflux disease 11/28/2018  . Erectile dysfunction 12/12/2017  . Hypercholesteremia 12/12/2017  . Routine general medical examination at a health care facility 04/12/2017  . 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 CIDP (chronic inflammatory demyelinating polyneuropathy) ([place]) 04/11/2015  . Type 2 diabetes mellitus ([place]) 06/24/2014  . Family history of colon cancer 06/09/2014    Current Outpatient Medications  Medication Sig Dispense Refill  . atorvastatin (LIPITOR) 20 MG tablet Take 1 tablet (20 mg total) by mouth daily. 90 tablet 3  . blood glucose meter kit and supplies KIT Dispense based on patient and insurance preference. Use up to four times daily as directed. (FOR ICD-9 250.00, 250.01). 1 each 0  . Lancets MISC Use to test blood sugars 2 times daily. 100 each 3  . meloxicam (MOBIC) 15 MG tablet Take 1 tablet (15 mg total) by mouth daily as needed for pain. Follow-up appt due in May must see provider for future refills 30 tablet 0  . metFORMIN (GLUCOPHAGE-XR) 500 MG 24 hr tablet Take 2 tablets (1,000 mg total) by mouth 2 (two) times daily. (Patient taking differently: Take 1,000 mg by mouth 2 (two) times daily. Per patient, is taking 2 in the am and 1 in the pm;) 360 tablet 1  . omeprazole (PRILOSEC) 40 MG capsule Take 1 capsule (40 mg total) by mouth daily. 90 capsule 3  . ONETOUCH ULTRA test strip USE TO CHECK GLUCOSE UP TO 4 TIMES DAILY AS DIRECTED    . gabapentin (NEURONTIN) 300 MG capsule Take 1 capsule (300 mg total) by mouth 2 (two) times daily. (Patient not taking: Reported on 06/02/2019) 60 capsule 2  . hydrocortisone-pramoxine (PROCTOFOAM HC) rectal foam Place 1 applicator rectally 3 (three) times daily. 10 g 0   No current facility-administered medications for this visit.    Allergies: Influenza vaccines and  Penicillins  Past Medical History:  Diagnosis Date  . Chicken pox   . Diabetes mellitus without complication ([place])    type 2  . Hemorrhoid   . Hyperlipidemia   . Jaundice of newborn   . Rheumatic fever   . UTI (lower urinary tract infection)     Past Surgical History:  Procedure Laterality Date  . COLONOSCOPY  ages [age]   done at [facility] and [facility](pt unable to get records)    Family History  Problem Relation Age of Onset  . Arthritis Mother   . Cancer Mother        bone cancer  . Colon cancer Father 30  . Prostate cancer Father   . Heart disease Father   . Hypertension Father   . Diabetes Father   . Colon cancer Brother 11       late 30's when dx  . Cancer Brother        another cancer METS to brain  . Colon cancer Brother 80  . Colon cancer Other 23       Bill's daughter  . Healthy Son        x 4  . Healthy Daughter        x 1    Social History   Tobacco Use  . Smoking status: Former Smoker    Quit date: 06/04/1994    Years since quitting: 25.0  . Smokeless tobacco: Never Used  Substance Use Topics  .  Alcohol use: Not Currently    Alcohol/week: 0.0 standard drinks    Comment: Occasionally (one drink per week).  Heavier drinking in the [employer] for ~ 8 years.     Subjective:  Follow-up on Type 2 Diabetes- currently taking Metformin; has no concerns for episodes of low blood sugar; Denies any chest pain, shortness of breath, blurred vision or headache Is scheduled to see neurology in follow-up in 2 weeks- having no further numbness/ tingling;  Is concerned about some rectal pain with bowel movements; known history of hemorrhoids; due for colonoscopy in June; Notes that he injured his tailbone last year and is concerned that never healed completely;    Objective:  Vitals:   06/02/19 0920  BP: (!) 142/88  Pulse: 78  Temp: 98 F (36.7 C)  TempSrc: Oral  SpO2: 97%  Weight: 215 lb 4 oz (97.6 kg)  Height: '5\' 11"'  (1.803 m)    General:  Well developed, well nourished, in no acute distress  Skin : Warm and dry.  Head: Normocephalic and atraumatic  Lungs: Respirations unlabored; clear to auscultation bilaterally without wheeze, rales, rhonchi  CVS exam: normal rate and regular rhythm.  Neurologic: Alert and oriented; speech intact; face symmetrical; moves all extremities well; CNII-XII intact without focal deficit  Patient defers rectal exam  Assessment:  1. Coccyx pain   2. Rectal pain   3. Type 2 diabetes mellitus without complication, without long-term current use of insulin ([place])     Plan:  1. Update X-ray today; 2. Rx for Proctofoam HC apply tid; refer to GI for updated colnoscopy; 3. Update labs today; follow up to be determined.  This visit occurred during the SARS-CoV-2 public health emergency.  Safety protocols were in place, including screening questions prior to the visit, additional usage of staff PPE, and extensive cleaning of exam room while observing appropriate contact time as indicated for disinfecting solutions.    No follow-ups on file.  Orders Placed This Encounter  Procedures  . DG Sacrum/Coccyx    Standing Status:   Future    Number of Occurrences:   1    Standing Expiration Date:   08/01/2020    Order Specific Question:   Reason for Exam (SYMPTOM  OR DIAGNOSIS REQUIRED)    Answer:   pain    Order Specific Question:   Preferred imaging location?    Answer:   [facility]    Order Specific Question:   Radiology Contrast Protocol - do NOT remove file path    Answer:   [email or web address]  . CBC with Differential/Platelet  . Comp Met (CMET)  . HgB A1c  . Ambulatory referral to Gastroenterology    Referral Priority:   Routine    Referral Type:   Consultation    Referral Reason:   Specialty Services Required    Referred to Provider:   Ladene Artist, MD    Number of Visits Requested:   1    Requested Prescriptions   Signed Prescriptions Disp  Refills  . hydrocortisone-pramoxine (PROCTOFOAM HC) rectal foam 10 g 0    Sig: Place 1 applicator rectally 3 (three) times daily.

## 2019-06-03 ENCOUNTER — Encounter: Payer: Self-pay | Admitting: Gastroenterology

## 2019-06-09 ENCOUNTER — Ambulatory Visit: Payer: BC Managed Care – PPO

## 2019-06-17 ENCOUNTER — Encounter: Payer: Self-pay | Admitting: Gastroenterology

## 2019-06-17 ENCOUNTER — Ambulatory Visit: Payer: BC Managed Care – PPO | Admitting: Gastroenterology

## 2019-06-17 VITALS — BP 114/66 | HR 84 | Temp 97.0°F | Ht 71.0 in | Wt 213.0 lb

## 2019-06-17 DIAGNOSIS — Z800 Family history of malignant neoplasm of digestive organs: Secondary | ICD-10-CM | POA: Diagnosis not present

## 2019-06-17 MED ORDER — SUTAB 1479-225-188 MG PO TABS: 1.0000 | ORAL_TABLET | Freq: Once | ORAL | 0 refills | Status: AC

## 2019-06-17 NOTE — Progress Notes (Signed)
06/17/2019 Austin Meadows [id number] [birth year]   HISTORY OF PRESENT ILLNESS: This is a pleasant 59 year old male who is a patient of Dr. Lynne Leader.  He has extensive family history of colon cancer in his father, 2 brothers, and multiple nieces.  His last colonoscopy was in June 2016.  He is here today at the request of his PCP, [name], FNP, for evaluation regarding rectal pain.  He tells me that he had to take some Solu-Medrol to counteract some side effects/reaction that he had to medication.  He says that he thinks the Solu-Medrol caused him to be constipated.  With the straining he irritated his hemorrhoids.  They prescribed him hydrocortisone in the form of Proctofoam.  He says that he has been using that and his pain has about completely resolved at this point.  He denies any bleeding.  He says that his bowels have returned pretty much to his normal for the most part.  He tends to have looser stools because of his Metformin.   Past Medical History:  Diagnosis Date  . Chicken pox   . Diabetes mellitus without complication ([place])    type 2  . Hemorrhoid   . Hyperlipidemia   . Jaundice of newborn   . Rheumatic fever   . UTI (lower urinary tract infection)    Past Surgical History:  Procedure Laterality Date  . COLONOSCOPY  ages [phone]   done [facility](pt unable to get records)    reports that he quit smoking about 25 years ago. His smoking use included cigarettes. He has never used smokeless tobacco. He reports previous alcohol use. He reports that he does not use drugs. family history includes Arthritis in his mother; Cancer in his brother and mother; Colon cancer (age of onset: 22) in an other family member; Colon cancer (age of onset: 38) in his brother; Colon cancer (age of onset: 22) in his brother; Colon cancer (age of onset: 61) in his father; Diabetes in his father; Healthy in his daughter and son; Heart disease in his father; Hypertension in his  father; Prostate cancer in his father. Allergies  Allergen Reactions  . Influenza Vaccines Other (See Comments)    Guillain barre syndrome  . Penicillins Other (See Comments)    Pt can't remember reaction.      Outpatient Encounter Medications as of 06/17/2019  Medication Sig  . atorvastatin (LIPITOR) 20 MG tablet Take 1 tablet (20 mg total) by mouth daily.  . blood glucose meter kit and supplies KIT Dispense based on patient and insurance preference. Use up to four times daily as directed. (FOR ICD-9 250.00, 250.01).  [name] Kitchen gabapentin (NEURONTIN) 300 MG capsule Take 1 capsule (300 mg total) by mouth 2 (two) times daily.  . hydrocortisone-pramoxine (PROCTOFOAM HC) rectal foam Place 1 applicator rectally 3 (three) times daily.  . Lancets MISC Use to test blood sugars 2 times daily.  . meloxicam (MOBIC) 15 MG tablet Take 1 tablet (15 mg total) by mouth daily as needed for pain. Follow-up appt due in May must see provider for future refills  . metFORMIN (GLUCOPHAGE-XR) 500 MG 24 hr tablet Take 2 tablets (1,000 mg total) by mouth 2 (two) times daily. (Patient taking differently: Take 1,000 mg by mouth 2 (two) times daily. Per patient, is taking 2 in the am and 1 in the pm;)  . omeprazole (PRILOSEC) 40 MG capsule Take 40 mg by mouth as needed.  Glory Rosebush ULTRA test strip USE TO  CHECK GLUCOSE UP TO 4 TIMES DAILY AS DIRECTED  . [DISCONTINUED] omeprazole (PRILOSEC) 40 MG capsule Take 1 capsule (40 mg total) by mouth daily. (Patient not taking: Reported on 06/17/2019)   No facility-administered encounter medications on file as of 06/17/2019.     REVIEW OF SYSTEMS  : All other systems reviewed and negative except where noted in the History of Present Illness.   PHYSICAL EXAM: BP 114/66 (BP Location: Left Arm, Patient Position: Sitting, Cuff Size: Large)   Pulse 84   Temp (!) 97 F (36.1 C)   Ht _0  (1.803 m)   Wt 213 lb (96.6 kg)   SpO2 98%   BMI 29.71 kg/m  General: Well developed white  male in no acute distress Head: Normocephalic and atraumatic Eyes:  Sclerae anicteric, conjunctiva pink. Ears: Normal auditory acuity Lungs: Clear throughout to auscultation; no increased WOB. Heart: Regular rate and rhythm; no M/R/G. Abdomen: Soft, non-distended.  BS present.  Non-tender. Rectal:  Will be done at the time of colonoscopy. Musculoskeletal: Symmetrical with no gross deformities  Skin: No lesions on visible extremities Extremities: No edema  Neurological: Alert oriented x 4, grossly non-focal Psychological:  Alert and cooperative. Normal mood and affect  ASSESSMENT AND PLAN: *Family history of colon cancer in multiple family members including father, 2 brothers, and multiple nieces.  Last colonoscopy was 08/2014.  Is due for colonoscopy in less than 2 months so he would like to schedule that while he is here today. *Rectal pain:  Had some issue with constipation, which irritated his hemorrhoids.  Has been using hydrocortisone and now pain has resolved.  **The risks, benefits, and alternatives to colonoscopy were discussed with the patient and he consents to proceed.   CC:  Marrian Salvage,*

## 2019-06-17 NOTE — Progress Notes (Signed)
Reviewed and agree with management plan.  Layken Doenges T. Darnelle Derrick, MD FACG [facility]  

## 2019-06-17 NOTE — Patient Instructions (Signed)
If you are age 57 or older, your body mass index should be between 23-30. Your Body mass index is 29.71 kg/m. If this is out of the aforementioned range listed, please consider follow up with your Primary Care Provider.  If you are age 57 or younger, your body mass index should be between 19-25. Your Body mass index is 29.71 kg/m. If this is out of the aformentioned range listed, please consider follow up with your Primary Care Provider.   You have been scheduled for a colonoscopy. Please follow written instructions given to you at your visit today.  Please pick up your prep supplies at the pharmacy within the next 1-3 days. If you use inhalers (even only as needed), please bring them with you on the day of your procedure.

## 2019-06-18 ENCOUNTER — Telehealth (INDEPENDENT_AMBULATORY_CARE_PROVIDER_SITE_OTHER): Payer: BC Managed Care – PPO | Admitting: Neurology

## 2019-06-18 ENCOUNTER — Encounter: Payer: Self-pay | Admitting: Neurology

## 2019-06-18 ENCOUNTER — Other Ambulatory Visit: Payer: Self-pay

## 2019-06-18 VITALS — Ht 71.0 in | Wt 212.0 lb

## 2019-06-18 DIAGNOSIS — G6181 Chronic inflammatory demyelinating polyneuritis: Secondary | ICD-10-CM

## 2019-06-18 MED ORDER — GABAPENTIN 300 MG PO CAPS: ORAL_CAPSULE | ORAL | 5 refills | Status: DC

## 2019-06-18 NOTE — Progress Notes (Signed)
   Virtual Visit via Video Note The purpose of this virtual visit is to provide medical care while limiting exposure to the novel coronavirus.    Consent was obtained for video visit:  Yes.   Answered questions that patient had about telehealth interaction:  Yes.   I discussed the limitations, risks, security and privacy concerns of performing an evaluation and management service by telemedicine. I also discussed with the patient that there may be a patient responsible charge related to this service. The patient expressed understanding and agreed to proceed.  Pt location: Home Physician Location: office Name of referring provider:  Marrian Salvage,* I connected with Austin Meadows at patients initiation/request on 06/18/2019 at  8:50 AM EDT by video enabled telemedicine application and verified that I am speaking with the correct person using two identifiers. Pt MRN:  [id number] Pt DOB:  [birth year] Video Participants:  Austin Meadows   History of Present Illness: This is a 59 y.o. male returning for follow-up of sensory CIDP.  He completed induction dose Solumedrol in January and received two additional maintenance doses, which has significantly improved his tingling/numbness of the face and arms.  Was having mild numbness over the soles of the feet, but even this has significantly improved.  He only notices numbness when wearing specific shoes usually about twice per week.  Balance has also improved, though he is cautious when climbing stairs.  He has no new things and is very pleased with the progress.    Observations/Objective:   Vitals:   06/18/19 0817  Weight: 212 lb (96.2 kg)  Height: 5\' 11"  (1.803 m)   Patient is awake, alert, and appears comfortable.  Oriented x 4.   Extraocular muscles are intact. No ptosis.  Face is symmetric.  Speech is not dysarthric. Tongue is midline. Antigravity in all extremities.  No pronator drift. Gait appears normal.  Tandem and stressed gait intact..    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  He was treated with Solu-Medrol and has had marked improvement.  Will hold on further steroid infusions. If he develops paresthesias again, start Solumedrol 1g every 28 days He can continue gabapentin 300 mg 1-2 times daily as needed for pain   Follow Up Instructions:   I discussed the assessment and treatment plan with the patient. The patient was provided an opportunity to ask questions and all were answered. The patient agreed with the plan and demonstrated an understanding of the instructions.   The patient was advised to call back or seek an in-person evaluation if the symptoms worsen or if the condition fails to improve as anticipated.  Follow-up in 6-8 months   Alda Berthold, DO

## 2019-07-01 ENCOUNTER — Other Ambulatory Visit: Payer: Self-pay

## 2019-07-01 ENCOUNTER — Encounter: Payer: Self-pay | Admitting: Gastroenterology

## 2019-07-01 ENCOUNTER — Ambulatory Visit (AMBULATORY_SURGERY_CENTER): Payer: BC Managed Care – PPO | Admitting: Gastroenterology

## 2019-07-01 VITALS — BP 124/74 | HR 64 | Temp 96.6°F | Resp 17 | Ht 71.0 in | Wt 213.0 lb

## 2019-07-01 DIAGNOSIS — Z800 Family history of malignant neoplasm of digestive organs: Secondary | ICD-10-CM

## 2019-07-01 DIAGNOSIS — Z1211 Encounter for screening for malignant neoplasm of colon: Secondary | ICD-10-CM

## 2019-07-01 DIAGNOSIS — D123 Benign neoplasm of transverse colon: Secondary | ICD-10-CM | POA: Diagnosis not present

## 2019-07-01 MED ORDER — SODIUM CHLORIDE 0.9 % IV SOLN: 500.0000 mL | Freq: Once | INTRAVENOUS | Status: DC

## 2019-07-01 NOTE — Patient Instructions (Signed)
Discharge instructions given. Handouts on polyps and hemorrhoids. Resume previous medications. YOU HAD AN ENDOSCOPIC PROCEDURE TODAY AT THE [facility]:   Refer to the procedure report that was given to you for any specific questions about what was found during the examination.  If the procedure report does not answer your questions, please call your gastroenterologist to clarify.  If you requested that your care partner not be given the details of your procedure findings, then the procedure report has been included in a sealed envelope for you to review at your convenience later.  YOU SHOULD EXPECT: Some feelings of bloating in the abdomen. Passage of more gas than usual.  Walking can help get rid of the air that was put into your GI tract during the procedure and reduce the bloating. If you had a lower endoscopy (such as a colonoscopy or flexible sigmoidoscopy) you may notice spotting of blood in your stool or on the toilet paper. If you underwent a bowel prep for your procedure, you may not have a normal bowel movement for a few days.  Please Note:  You might notice some irritation and congestion in your nose or some drainage.  This is from the oxygen used during your procedure.  There is no need for concern and it should clear up in a day or so.  SYMPTOMS TO REPORT IMMEDIATELY:  Following lower endoscopy (colonoscopy or flexible sigmoidoscopy):  Excessive amounts of blood in the stool  Significant tenderness or worsening of abdominal pains  Swelling of the abdomen that is new, acute  Fever of 100F or higher   For urgent or emergent issues, a gastroenterologist can be reached at any hour by calling [phone]. Do not use MyChart messaging for urgent concerns.    DIET:  We do recommend a small meal at first, but then you may proceed to your regular diet.  Drink plenty of fluids but you should avoid alcoholic beverages for 24 hours.  ACTIVITY:  You should plan to take it  easy for the rest of today and you should NOT DRIVE or use heavy machinery until tomorrow (because of the sedation medicines used during the test).    FOLLOW UP: Our staff will call the number listed on your records 48-72 hours following your procedure to check on you and address any questions or concerns that you may have regarding the information given to you following your procedure. If we do not reach you, we will leave a message.  We will attempt to reach you two times.  During this call, we will ask if you have developed any symptoms of COVID 19. If you develop any symptoms (ie: fever, flu-like symptoms, shortness of breath, cough etc.) before then, please call [phone].  If you test positive for Covid 19 in the 2 weeks post procedure, please call and report this information to us.    If any biopsies were taken you will be contacted by phone or by letter within the next 1-3 weeks.  Please call us at [phone] if you have not heard about the biopsies in 3 weeks.    SIGNATURES/CONFIDENTIALITY: You and/or your care partner have signed paperwork which will be entered into your electronic medical record.  These signatures attest to the fact that that the information above on your After Visit Summary has been reviewed and is understood.  Full responsibility of the confidentiality of this discharge information lies with you and/or your care-partner.  

## 2019-07-01 NOTE — Progress Notes (Signed)
Report given to PACU, vss 

## 2019-07-01 NOTE — Op Note (Signed)
[facility] Patient Name: Austin Meadows Procedure Date: 07/01/2019 2:53 PM MRN: [id number] Endoscopist: Ladene Artist , MD Age: 59 Referring MD:  Date of Birth: 02/20/1961 Gender: Male Account #: [id number] Procedure:                Colonoscopy Indications:              Screening patient at increased risk: Family history                            of colorectal cancer in multiple 1st-degree and                            2nd-degree relatives Medicines:                Monitored Anesthesia Care Procedure:                Pre-Anesthesia Assessment:                           - Prior to the procedure, a History and Physical                            was performed, and patient medications and                            allergies were reviewed. The patient's tolerance of                            previous anesthesia was also reviewed. The risks                            and benefits of the procedure and the sedation                            options and risks were discussed with the patient.                            All questions were answered, and informed consent                            was obtained. Prior Anticoagulants: The patient has                            taken no previous anticoagulant or antiplatelet                            agents. ASA Grade Assessment: II - A patient with                            mild systemic disease. After reviewing the risks                            and benefits, the patient was deemed in  satisfactory condition to undergo the procedure.                           After obtaining informed consent, the colonoscope                            was passed under direct vision. Throughout the                            procedure, the patient's blood pressure, pulse, and                            oxygen saturations were monitored continuously. The                            Colonoscope was introduced through the anus  and                            advanced to the the cecum, identified by                            appendiceal orifice and ileocecal valve. The                            ileocecal valve, appendiceal orifice, and rectum                            were photographed. The quality of the bowel                            preparation was good. The colonoscopy was performed                            without difficulty. The patient tolerated the                            procedure well. Scope In: 2:57:49 PM Scope Out: 3:07:12 PM Scope Withdrawal Time: 0 hours 8 minutes 8 seconds  Total Procedure Duration: 0 hours 9 minutes 23 seconds  Findings:                 The perianal and digital rectal examinations were                            normal.                           A 5 mm polyp was found in the transverse colon. The                            polyp was sessile. The polyp was removed with a                            cold snare. Resection and retrieval were complete.  Internal hemorrhoids were found during                            retroflexion. The hemorrhoids were small and Grade                            I (internal hemorrhoids that do not prolapse).                           The exam was otherwise without abnormality on                            direct and retroflexion views. Complications:            No immediate complications. Estimated blood loss:                            None. Estimated Blood Loss:     Estimated blood loss: none. Impression:               - One 5 mm polyp in the transverse colon, removed                            with a cold snare. Resected and retrieved.                           - Internal hemorrhoids.                           - The examination was otherwise normal on direct                            and retroflexion views. Recommendation:           - Repeat colonoscopy in 5 years for                             surveillance/screening.                           - Patient has a contact number available for                            emergencies. The signs and symptoms of potential                            delayed complications were discussed with the                            patient. Return to normal activities tomorrow.                            Written discharge instructions were provided to the                            patient.                           -  Resume previous diet.                           - Continue present medications.                           - Await pathology results. Ladene Artist, MD 07/01/2019 3:10:01 PM This report has been signed electronically.

## 2019-07-01 NOTE — Progress Notes (Signed)
Called to room to assist during endoscopic procedure.  Patient ID and intended procedure confirmed with present staff. Received instructions for my participation in the procedure from the performing physician.  

## 2019-07-01 NOTE — Progress Notes (Signed)
Temp check by:JB Vital check by:DT  The medical and surgical history was reviewed and verified with the patient. 

## 2019-07-03 ENCOUNTER — Telehealth: Payer: Self-pay

## 2019-07-03 NOTE — Telephone Encounter (Signed)
NO ANSWER, MESSAGE LEFT FOR PATIENT. 

## 2019-07-03 NOTE — Telephone Encounter (Signed)
Left message on follow up call. 

## 2019-07-13 ENCOUNTER — Encounter: Payer: Self-pay | Admitting: Gastroenterology

## 2019-08-06 NOTE — Telephone Encounter (Signed)
Cant see notes for when pt was taken out of work,  pt called to see if Dr Posey Pronto was who took him out of work no answer voice mail was left for Austin Meadows to call the office back

## 2019-08-10 ENCOUNTER — Other Ambulatory Visit: Payer: Self-pay | Admitting: Family

## 2019-08-10 DIAGNOSIS — E119 Type 2 diabetes mellitus without complications: Secondary | ICD-10-CM

## 2019-09-28 ENCOUNTER — Encounter: Payer: Self-pay | Admitting: Neurology

## 2019-09-28 ENCOUNTER — Telehealth: Payer: Self-pay

## 2019-09-28 NOTE — Telephone Encounter (Signed)
Called Patient and he stated that he would like a letter for work.

## 2019-09-28 NOTE — Telephone Encounter (Signed)
Patient requested a note to return to work. Letter written and patient notified. Patient requested letter be mailed to him.   Letter mailed.

## 2019-12-12 ENCOUNTER — Other Ambulatory Visit: Payer: Self-pay | Admitting: Family

## 2019-12-12 DIAGNOSIS — E119 Type 2 diabetes mellitus without complications: Secondary | ICD-10-CM

## 2019-12-23 ENCOUNTER — Ambulatory Visit: Payer: BC Managed Care – PPO | Admitting: Family

## 2019-12-23 ENCOUNTER — Other Ambulatory Visit: Payer: Self-pay

## 2019-12-23 ENCOUNTER — Encounter: Payer: Self-pay | Admitting: Family

## 2019-12-23 VITALS — BP 132/84 | HR 76 | Temp 98.1°F | Ht 71.0 in | Wt 218.6 lb

## 2019-12-23 DIAGNOSIS — E119 Type 2 diabetes mellitus without complications: Secondary | ICD-10-CM | POA: Diagnosis not present

## 2019-12-23 DIAGNOSIS — Z125 Encounter for screening for malignant neoplasm of prostate: Secondary | ICD-10-CM

## 2019-12-23 DIAGNOSIS — E782 Mixed hyperlipidemia: Secondary | ICD-10-CM | POA: Diagnosis not present

## 2019-12-23 LAB — LIPID PANEL
Cholesterol: 165 mg/dL (ref 0–200)
HDL: 50.3 mg/dL (ref >39.00)
LDL Cholesterol: 95 mg/dL (ref 0–99)
NonHDL: 114.62
Total CHOL/HDL Ratio: 3
Triglycerides: 98 mg/dL (ref 0.0–149.0)
VLDL: 19.6 mg/dL (ref 0.0–40.0)

## 2019-12-23 LAB — COMPREHENSIVE METABOLIC PANEL
ALT: 27 U/L (ref 0–53)
AST: 16 U/L (ref 0–37)
Albumin: 4.7 g/dL (ref 3.5–5.2)
Alkaline Phosphatase: 51 U/L (ref 39–117)
BUN: 15 mg/dL (ref 6–23)
CO2: 32 mEq/L (ref 19–32)
Calcium: 10 mg/dL (ref 8.4–10.5)
Chloride: 100 mEq/L (ref 96–112)
Creatinine, Ser: 0.95 mg/dL (ref 0.40–1.50)
GFR: 88.37 mL/min (ref >60.00)
Glucose, Bld: 96 mg/dL (ref 70–99)
Potassium: 5 mEq/L (ref 3.5–5.1)
Sodium: 138 mEq/L (ref 135–145)
Total Bilirubin: 0.6 mg/dL (ref 0.2–1.2)
Total Protein: 7.1 g/dL (ref 6.0–8.3)

## 2019-12-23 LAB — MICROALBUMIN / CREATININE URINE RATIO
Creatinine,U: 134.5 mg/dL
Microalb Creat Ratio: 0.5 mg/g (ref 0.0–30.0)
Microalb, Ur: <0.7 mg/dL (ref 0.0–1.9)

## 2019-12-23 LAB — CBC WITH DIFFERENTIAL/PLATELET
Basophils Absolute: 0 10*3/uL (ref 0.0–0.1)
Basophils Relative: 0.6 % (ref 0.0–3.0)
Eosinophils Absolute: 0.1 10*3/uL (ref 0.0–0.7)
Eosinophils Relative: 2.3 % (ref 0.0–5.0)
HCT: 43.7 % (ref 39.0–52.0)
Hemoglobin: 14.5 g/dL (ref 13.0–17.0)
Lymphocytes Relative: 33.8 % (ref 12.0–46.0)
Lymphs Abs: 2.1 10*3/uL (ref 0.7–4.0)
MCHC: 33.1 g/dL (ref 30.0–36.0)
MCV: 82.7 fl (ref 78.0–100.0)
Monocytes Absolute: 0.6 10*3/uL (ref 0.1–1.0)
Monocytes Relative: 10.2 % (ref 3.0–12.0)
Neutro Abs: 3.3 10*3/uL (ref 1.4–7.7)
Neutrophils Relative %: 53.1 % (ref 43.0–77.0)
Platelets: 248 10*3/uL (ref 150.0–400.0)
RBC: 5.29 Mil/uL (ref 4.22–5.81)
RDW: 14.4 % (ref 11.5–15.5)
WBC: 6.1 10*3/uL (ref 4.0–10.5)

## 2019-12-23 LAB — HEMOGLOBIN A1C: Hgb A1c MFr Bld: 7 % — ABNORMAL HIGH (ref 4.6–6.5)

## 2019-12-23 LAB — PSA: PSA: 0.83 ng/mL (ref 0.10–4.00)

## 2019-12-23 MED ORDER — ATORVASTATIN CALCIUM 20 MG PO TABS: 20.0000 mg | ORAL_TABLET | Freq: Every day | ORAL | 3 refills | Status: DC

## 2019-12-23 NOTE — Progress Notes (Signed)
Austin Meadows is a 59 y.o. male with the following history as recorded in EpicCare:  Patient Active Problem List   Diagnosis Date Noted  . Gastroesophageal reflux disease 11/28/2018  . Erectile dysfunction 12/12/2017  . Hypercholesteremia 12/12/2017  . Routine general medical examination at a health care facility 04/12/2017  . 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 CIDP (chronic inflammatory demyelinating polyneuropathy) ([place]) 04/11/2015  . Type 2 diabetes mellitus ([place]) 06/24/2014  . Family history of colon cancer 06/09/2014    Current Outpatient Medications  Medication Sig Dispense Refill  . atorvastatin (LIPITOR) 20 MG tablet Take 1 tablet (20 mg total) by mouth daily. 90 tablet 3  . blood glucose meter kit and supplies KIT Dispense based on patient and insurance preference. Use up to four times daily as directed. (FOR ICD-9 250.00, 250.01). 1 each 0  . gabapentin (NEURONTIN) 300 MG capsule Take 1 tablet 1-2 times daily as needed for pain. 60 capsule 5  . hydrocortisone-pramoxine (PROCTOFOAM HC) rectal foam Place 1 applicator rectally 3 (three) times daily. 10 g 0  . Lancets MISC Use to test blood sugars 2 times daily. 100 each 3  . meloxicam (MOBIC) 15 MG tablet Take 1 tablet (15 mg total) by mouth daily as needed for pain. Follow-up appt due in May must see provider for future refills 30 tablet 0  . metFORMIN (GLUCOPHAGE-XR) 500 MG 24 hr tablet Take 1 tablet by mouth twice daily 60 tablet 0  . omeprazole (PRILOSEC) 40 MG capsule Take 40 mg by mouth as needed.    Glory Rosebush ULTRA test strip USE TO CHECK GLUCOSE UP TO 4 TIMES DAILY AS DIRECTED     No current facility-administered medications for this visit.    Allergies: Influenza vaccines and Penicillins  Past Medical History:  Diagnosis Date  . Chicken pox   . Diabetes mellitus without complication ([place])    type 2  . Hemorrhoid   . Hyperlipidemia   . Jaundice of newborn   . Rheumatic fever   . UTI (lower urinary  tract infection)     Past Surgical History:  Procedure Laterality Date  . COLONOSCOPY  ages [age]   done [facility] and [facility](pt unable to get records)    Family History  Problem Relation Age of Onset  . Arthritis Mother   . Cancer Mother        bone cancer  . Colon cancer Father 10  . Prostate cancer Father   . Heart disease Father   . Hypertension Father   . Diabetes Father   . Throat cancer Father   . Colon cancer Brother 54       late 60's when dx  . Cancer Brother        another cancer METS to brain  . Colon cancer Brother 25  . Colon cancer Other 55       Bill's daughter  . Healthy Son        x 4  . Healthy Daughter        x 1  . Rectal cancer Neg Hx   . Stomach cancer Neg Hx   . Esophageal cancer Neg Hx     Social History   Tobacco Use  . Smoking status: Former Smoker    Types: Cigarettes    Quit date: 06/04/1994    Years since quitting: 25.5  . Smokeless tobacco: Never Used  Substance Use Topics  . Alcohol use: Not Currently    Alcohol/week:  0.0 standard drinks    Comment: Occasionally (one drink per week).  Heavier drinking in the [employer] for ~ 8 years.     Subjective:   Follow-up on type 2 Diabetes; overall doing well with no concerns; not checking sugars regularly but no concerns for low sugar; is currently taking his Metformin 2 in the am and 1 in the pm; Denies any chest pain, shortness of breath, blurred vision or headache   Objective:  Vitals:   12/23/19 1235  BP: 132/84  Pulse: 76  Temp: 98.1 F (36.7 C)  TempSrc: Oral  SpO2: 98%  Weight: 218 lb 9.6 oz (99.2 kg)  Height: '5\' 11"'  (1.803 m)    General: Well developed, well nourished, in no acute distress  Skin : Warm and dry.  Head: Normocephalic and atraumatic  Lungs: Respirations unlabored; clear to auscultation bilaterally without wheeze, rales, rhonchi  CVS exam: normal rate and regular rhythm.  Abdomen: Soft; nontender; nondistended; normoactive bowel sounds; no  masses or hepatosplenomegaly  Musculoskeletal: No deformities; no active joint inflammation  Extremities: No edema, cyanosis, clubbing  Vessels: Symmetric bilaterally  Neurologic: Alert and oriented; speech intact; face symmetrical; moves all extremities well; CNII-XII intact without focal deficit   Assessment:  1. Type 2 diabetes mellitus without complication, without long-term current use of insulin ([place])   2. Mixed hyperlipidemia   3. Prostate cancer screening     Plan:  1. Currently taking Metformin 500 mg 2 in the am and 1 in the pm; will adjust prescription as needed based on labs today; refer for baseline eye exam as well; 2. Stable; refill updated; 3. Check PSA today;   This visit occurred during the SARS-CoV-2 public health emergency.  Safety protocols were in place, including screening questions prior to the visit, additional usage of staff PPE, and extensive cleaning of exam room while observing appropriate contact time as indicated for disinfecting solutions.      No follow-ups on file.  Orders Placed This Encounter  Procedures  . CBC with Differential/Platelet    Standing Status:   Future    Number of Occurrences:   1    Standing Expiration Date:   12/22/2020  . Comp Met (CMET)    Standing Status:   Future    Number of Occurrences:   1    Standing Expiration Date:   12/22/2020  . Lipid panel    Standing Status:   Future    Number of Occurrences:   1    Standing Expiration Date:   12/22/2020  . PSA    Standing Status:   Future    Number of Occurrences:   1    Standing Expiration Date:   12/22/2020  . Hemoglobin A1c    Standing Status:   Future    Number of Occurrences:   1    Standing Expiration Date:   12/22/2020  . Urine Microalbumin w/creat. ratio    Standing Status:   Future    Number of Occurrences:   1    Standing Expiration Date:   12/22/2020  . Ambulatory referral to Ophthalmology    Referral Priority:   Routine    Referral Type:   Consultation     Referral Reason:   Specialty Services Required    Requested Specialty:   Ophthalmology    Number of Visits Requested:   1    Requested Prescriptions   Signed Prescriptions Disp Refills  . atorvastatin (LIPITOR) 20 MG tablet 90 tablet 3  Sig: Take 1 tablet (20 mg total) by mouth daily.

## 2019-12-24 ENCOUNTER — Encounter: Payer: Self-pay | Admitting: Family

## 2019-12-24 ENCOUNTER — Other Ambulatory Visit: Payer: Self-pay | Admitting: Family

## 2019-12-24 DIAGNOSIS — E119 Type 2 diabetes mellitus without complications: Secondary | ICD-10-CM

## 2019-12-24 MED ORDER — METFORMIN HCL ER 500 MG PO TB24: ORAL_TABLET | ORAL | 1 refills | Status: DC

## 2019-12-25 ENCOUNTER — Telehealth: Payer: Self-pay | Admitting: Family

## 2019-12-25 DIAGNOSIS — E119 Type 2 diabetes mellitus without complications: Secondary | ICD-10-CM

## 2019-12-29 MED ORDER — METFORMIN HCL ER 500 MG PO TB24: ORAL_TABLET | ORAL | 1 refills | Status: DC

## 2019-12-29 NOTE — Telephone Encounter (Signed)
Resent metformin to [facility].[name] KitchenJohny Chess

## 2019-12-29 NOTE — Telephone Encounter (Signed)
    Patient states as of today Austin Meadows is saying they do not have metFORMIN (GLUCOPHAGE-XR) 500 MG 24 hr tablet order, please resend

## 2020-01-08 ENCOUNTER — Other Ambulatory Visit: Payer: Self-pay | Admitting: Family

## 2020-01-22 ENCOUNTER — Telehealth (INDEPENDENT_AMBULATORY_CARE_PROVIDER_SITE_OTHER): Payer: BC Managed Care – PPO | Admitting: Neurology

## 2020-01-22 ENCOUNTER — Other Ambulatory Visit: Payer: Self-pay

## 2020-01-22 ENCOUNTER — Encounter: Payer: Self-pay | Admitting: Neurology

## 2020-01-22 VITALS — Wt 218.0 lb

## 2020-01-22 DIAGNOSIS — G6181 Chronic inflammatory demyelinating polyneuritis: Secondary | ICD-10-CM

## 2020-01-22 NOTE — Progress Notes (Signed)
   Virtual Visit via Video Note The purpose of this virtual visit is to provide medical care while limiting exposure to the novel coronavirus.    Consent was obtained for video visit:  Yes.   Answered questions that patient had about telehealth interaction:  Yes.   I discussed the limitations, risks, security and privacy concerns of performing an evaluation and management service by telemedicine. I also discussed with the patient that there may be a patient responsible charge related to this service. The patient expressed understanding and agreed to proceed.  Pt location: Home Physician Location: office Name of referring provider:  Marrian Salvage,* I connected with Austin Meadows at patients initiation/request on 01/22/2020 at  9:30 AM EST by video enabled telemedicine application and verified that I am speaking with the correct person using two identifiers. Pt MRN:  [id number] Pt DOB:  [birth year] Video Participants:  Austin Meadows   History of Present Illness: This is a 59 y.o. male returning for sensory CIDP.  For work, he wears steel-toed shoes and has numbness in the soles of the feet with this, which is significantly improved when the shoes are removed.  He is back to working full time with no restrictions, in fact, he is also doing some overtime.  He denies any new complaints.  He is fully vaccinated for COVID and tolerating all three immunizations very well.    Observations/Objective:   Vitals:   01/22/20 0907  Weight: 218 lb (98.9 kg)   Patient is awake, alert, and appears comfortable.  Oriented x 4.   Extraocular muscles are intact. No ptosis.  Face is symmetric.  Speech is not dysarthric. Antigravity in all extremities.  No pronator drift. Gait appears normal.  Tandem and stressed gait intact..   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  He responded very well with methylprednisolone  and has not had recurrence of symptoms.   Continue to monitor clinically Discontinue gabapentin    Follow Up Instructions:   I discussed the assessment and treatment plan with the patient. The patient was provided an opportunity to ask questions and all were answered. The patient agreed with the plan and demonstrated an understanding of the instructions.   The patient was advised to call back or seek an in-person evaluation if the symptoms worsen or if the condition fails to improve as anticipated.  Return to clinic in 1 year   Alda Berthold, DO

## 2020-03-08 ENCOUNTER — Other Ambulatory Visit: Payer: BC Managed Care – PPO

## 2020-03-08 ENCOUNTER — Telehealth: Payer: BC Managed Care – PPO | Admitting: Family

## 2020-03-08 DIAGNOSIS — R0981 Nasal congestion: Secondary | ICD-10-CM | POA: Diagnosis not present

## 2020-03-08 DIAGNOSIS — R051 Acute cough: Secondary | ICD-10-CM | POA: Diagnosis not present

## 2020-03-08 DIAGNOSIS — R509 Fever, unspecified: Secondary | ICD-10-CM | POA: Diagnosis not present

## 2020-03-08 DIAGNOSIS — Z20828 Contact with and (suspected) exposure to other viral communicable diseases: Secondary | ICD-10-CM | POA: Diagnosis not present

## 2020-09-27 ENCOUNTER — Other Ambulatory Visit: Payer: Self-pay

## 2020-09-27 ENCOUNTER — Ambulatory Visit: Payer: BC Managed Care – PPO | Admitting: Family Medicine

## 2020-09-27 VITALS — BP 139/79 | HR 102 | Temp 98.7°F | Resp 16 | Ht 71.0 in | Wt 208.0 lb

## 2020-09-27 DIAGNOSIS — E1165 Type 2 diabetes mellitus with hyperglycemia: Secondary | ICD-10-CM

## 2020-09-27 DIAGNOSIS — R350 Frequency of micturition: Secondary | ICD-10-CM | POA: Diagnosis not present

## 2020-09-27 LAB — POC URINALSYSI DIPSTICK (AUTOMATED)
Bilirubin, UA: NEGATIVE
Blood, UA: NEGATIVE
Glucose, UA: POSITIVE — AB
Ketones, UA: POSITIVE
Leukocytes, UA: NEGATIVE
Nitrite, UA: NEGATIVE
Protein, UA: NEGATIVE
Spec Grav, UA: 1.015 (ref 1.010–1.025)
Urobilinogen, UA: 0.2 E.U./dL
pH, UA: 6 (ref 5.0–8.0)

## 2020-09-27 LAB — GLUCOSE, POCT (MANUAL RESULT ENTRY): POC Glucose: 526 mg/dl — AB (ref 70–99)

## 2020-09-27 MED ORDER — TRESIBA FLEXTOUCH 100 UNIT/ML ~~LOC~~ SOPN: 10.0000 [IU] | PEN_INJECTOR | Freq: Every day | SUBCUTANEOUS | 4 refills | Status: DC

## 2020-09-27 MED ORDER — INSULIN PEN NEEDLE 31G X 5 MM MISC: 0 refills | Status: DC

## 2020-09-27 MED ORDER — INSULIN ASPART 100 UNIT/ML IJ SOLN: INTRAMUSCULAR | 99 refills | Status: DC

## 2020-09-27 NOTE — Assessment & Plan Note (Signed)
Ketones in urine  poc glucose 562 Labs drawn Pt did not want to go to Er tresiba sample given to pt--- 10 u given in office  rx for reg insulin given-- with sliding scale  Pt instructed to go to ER if there is any trouble getting insulin at the pharmacy F/u with pcp later this week  Pt understands and agrees.   Pt taught by myself and cma on how to give injection s

## 2020-09-27 NOTE — Progress Notes (Signed)
Subjective:   By signing my name below, I, Shehryar Baig, attest that this documentation has been prepared under the direction and in the presence of Dr. Roma Schanz, DO. 09/27/2020    [name], male    DOB: 1960-10-15, 60 y.o.   MRN: [id number]  Chief Complaint  Patient presents with   Urinary Frequency    Patient complains of urinary frequency that started about 2 weeks ago   Dizziness    Complains of feeling light headed also for about 2 weeks    HPI Patient is in today for a office visit. He complains of frequent urination for the past 3 weeks. He notes that he has both weak and strong streams of urine. He has no history of prostate issues.  He checks his blood sugars regularly and reports they range from 100-102. He continues taking 1000 mg metformin daily PO. Later he admitted not checking bs in several weeks  He has been thirsty and going to br every 15-20 min to urinate.    Past Medical History:  Diagnosis Date   Chicken pox    Diabetes mellitus without complication (HCC)    type 2   Hemorrhoid    Hyperlipidemia    Jaundice of newborn    Rheumatic fever    UTI (lower urinary tract infection)     Past Surgical History:  Procedure Laterality Date   COLONOSCOPY  ages [age]   done at [facility] and [facility](pt unable to get records)    Family History  Problem Relation Age of Onset   Arthritis Mother    Cancer Mother        bone cancer   Colon cancer Father 49   Prostate cancer Father    Heart disease Father    Hypertension Father    Diabetes Father    Throat cancer Father    Colon cancer Brother 49       late 18's when dx   Cancer Brother        another cancer METS to brain   Colon cancer Brother 74   Colon cancer Other 12       Bill's daughter   Healthy Son        x 4   Healthy Daughter        x 1   Rectal cancer Neg Hx    Stomach cancer Neg Hx    Esophageal cancer Neg Hx     Social History   Socioeconomic  History   Marital status: Married    Spouse name: Not on file   Number of children: 5   Years of education: 14   Highest education level: Not on file  Occupational History   Occupation: Diplomatic Services operational officer  Tobacco Use   Smoking status: Former    Types: Cigarettes    Quit date: 06/04/1994    Years since quitting: 26.3   Smokeless tobacco: Never  Vaping Use   Vaping Use: Never used  Substance and Sexual Activity   Alcohol use: Not Currently    Alcohol/week: 0.0 standard drinks    Comment: Occasionally (one drink per week).  Heavier drinking in the [employer] for ~ 8 years.    Drug use: No   Sexual activity: Not on file  Other Topics Concern   Not on file  Social History Narrative   Currently has 5 children; wife has 3 children.  Lives with his wife; Fun: rides a motorcycle.   Denies any religious beliefs  that would effect health care.    Lives in a 2 story home.     Works as a Diplomatic Services operational officer.  Education: associate degree.   Right handed   Social Determinants of Health   Financial Resource Strain: Not on file  Food Insecurity: Not on file  Transportation Needs: Not on file  Physical Activity: Not on file  Stress: Not on file  Social Connections: Not on file  Intimate Partner Violence: Not on file    Outpatient Medications Prior to Visit  Medication Sig Dispense Refill   atorvastatin (LIPITOR) 20 MG tablet Take 1 tablet (20 mg total) by mouth daily. 90 tablet 3   blood glucose meter kit and supplies KIT Dispense based on patient and insurance preference. Use up to four times daily as directed. (FOR ICD-9 250.00, 250.01). 1 each 0   gabapentin (NEURONTIN) 300 MG capsule Take 1 tablet 1-2 times daily as needed for pain. 60 capsule 5   Lancets MISC Use to test blood sugars 2 times daily. 100 each 3   meloxicam (MOBIC) 15 MG tablet Take 1 tablet (15 mg total) by mouth daily as needed for pain. Follow-up appt due in May must see provider for future refills 30 tablet 0    metFORMIN (GLUCOPHAGE-XR) 500 MG 24 hr tablet Take 2 tablets po in the am; take 1 tablet po in the pm; 270 tablet 1   omeprazole (PRILOSEC) 40 MG capsule Take 40 mg by mouth as needed.     ONETOUCH ULTRA test strip USE TO CHECK GLUCOSE UP TO 4 TIMES DAILY AS DIRECTED     PROCTOFOAM HC rectal foam PLACE 1 APPLICATORFUL RECTALLY  THREE TIMES DAILY 10 g 0   No facility-administered medications prior to visit.    Allergies  Allergen Reactions   Influenza Vaccines Other (See Comments)    Guillain barre syndrome   Penicillins Other (See Comments)    Pt can't remember reaction.    Review of Systems  Constitutional:  Negative for fever and malaise/fatigue.  HENT:  Negative for congestion.   Eyes:  Negative for blurred vision.  Respiratory:  Negative for cough and shortness of breath.   Cardiovascular:  Negative for chest pain, palpitations and leg swelling.  Gastrointestinal:  Negative for abdominal pain, blood in stool, nausea and vomiting.  Genitourinary:  Positive for frequency. Negative for dysuria.  Musculoskeletal:  Negative for back pain and falls.  Skin:  Negative for rash.  Neurological:  Negative for dizziness, loss of consciousness and headaches.  Endo/Heme/Allergies:  Negative for environmental allergies.  Psychiatric/Behavioral:  Negative for depression. The patient is not nervous/anxious.       Objective:    Physical Exam Vitals and nursing note reviewed.  Constitutional:      General: He is not in acute distress.    Appearance: Normal appearance. He is not ill-appearing.  HENT:     Head: Normocephalic and atraumatic.     Right Ear: External ear normal.     Left Ear: External ear normal.  Eyes:     Extraocular Movements: Extraocular movements intact.     Pupils: Pupils are equal, round, and reactive to light.  Cardiovascular:     Rate and Rhythm: Normal rate and regular rhythm.     Pulses: Normal pulses.     Heart sounds: Normal heart sounds. No murmur heard.    No gallop.  Pulmonary:     Effort: Pulmonary effort is normal. No respiratory distress.     Breath sounds:  Normal breath sounds. No wheezing, rhonchi or rales.  Genitourinary:    Comments: Prostate was slightly enlarged, smooth and non-tender, no nodules Skin:    General: Skin is warm and dry.  Neurological:     Mental Status: He is alert and oriented to person, place, and time.  Psychiatric:        Behavior: Behavior normal.    BP 139/79 (BP Location: Right Arm, Patient Position: Sitting, Cuff Size: Small)   Pulse (!) 102   Temp 98.7 F (37.1 C) (Oral)   Resp 16   Ht '5\' 11"'  (1.803 m)   Wt 208 lb (94.3 kg)   SpO2 97%   BMI 29.01 kg/m  Wt Readings from Last 3 Encounters:  09/27/20 208 lb (94.3 kg)  01/22/20 218 lb (98.9 kg)  12/23/19 218 lb 9.6 oz (99.2 kg)    Diabetic Foot Exam - Simple   No data filed    Lab Results  Component Value Date   WBC 6.1 12/23/2019   HGB 14.5 12/23/2019   HCT 43.7 12/23/2019   PLT 248.0 12/23/2019   GLUCOSE 526 (HH) 09/27/2020   CHOL 184 09/27/2020   TRIG (H) 09/27/2020    586.0 Triglyceride is over 400; calculations on Lipids are invalid.   HDL 31.80 (L) 09/27/2020   LDLDIRECT 107.0 09/27/2020   LDLCALC 95 12/23/2019   ALT 28 09/27/2020   AST 16 09/27/2020   NA 129 (L) 09/27/2020   K 4.5 09/27/2020   CL 92 (L) 09/27/2020   CREATININE 1.31 09/27/2020   BUN 24 (H) 09/27/2020   CO2 27 09/27/2020   TSH 1.41 12/12/2017   PSA 0.83 12/23/2019   HGBA1C 12.1 Repeated and verified X2. (H) 09/27/2020   MICROALBUR <0.7 09/27/2020    Lab Results  Component Value Date   TSH 1.41 12/12/2017   Lab Results  Component Value Date   WBC 6.1 12/23/2019   HGB 14.5 12/23/2019   HCT 43.7 12/23/2019   MCV 82.7 12/23/2019   PLT 248.0 12/23/2019   Lab Results  Component Value Date   NA 129 (L) 09/27/2020   K 4.5 09/27/2020   CO2 27 09/27/2020   GLUCOSE 526 (HH) 09/27/2020   BUN 24 (H) 09/27/2020   CREATININE 1.31 09/27/2020   BILITOT  0.5 09/27/2020   ALKPHOS 98 09/27/2020   AST 16 09/27/2020   ALT 28 09/27/2020   PROT 8.0 09/27/2020   ALBUMIN 4.7 09/27/2020   CALCIUM 10.1 09/27/2020   GFR 60.23 09/27/2020   Lab Results  Component Value Date   CHOL 184 09/27/2020   Lab Results  Component Value Date   HDL 31.80 (L) 09/27/2020   Lab Results  Component Value Date   LDLCALC 95 12/23/2019   Lab Results  Component Value Date   TRIG (H) 09/27/2020    586.0 Triglyceride is over 400; calculations on Lipids are invalid.   Lab Results  Component Value Date   CHOLHDL 6 09/27/2020   Lab Results  Component Value Date   HGBA1C 12.1 Repeated and verified X2. (H) 09/27/2020       Assessment & Plan:   Problem List Items Addressed This Visit       Unprioritized   Uncontrolled type 2 diabetes mellitus with hyperglycemia ([place]) - Primary    Ketones in urine  poc glucose 562 Labs drawn Pt did not want to go to Er tresiba sample given to pt--- 10 u given in office  rx for reg insulin given-- with sliding  scale  Pt instructed to go to ER if there is any trouble getting insulin at the pharmacy F/u with pcp later this week  Pt understands and agrees.   Pt taught by myself and cma on how to give injection s        Relevant Medications   insulin degludec (TRESIBA FLEXTOUCH) 100 UNIT/ML FlexTouch Pen   insulin aspart (NOVOLOG) 100 UNIT/ML injection   Insulin Pen Needle 31G X 5 MM MISC   Other Relevant Orders   POCT Glucose (CBG) (Completed)   Lipid panel (Completed)   Microalbumin / creatinine urine ratio (Completed)   Hemoglobin A1c (Completed)   Comprehensive metabolic panel (Completed)   Ambulatory referral to Endocrinology   Other Visit Diagnoses     Urinary frequency       Relevant Orders   POCT Urinalysis Dipstick (Automated) (Completed)   POCT Glucose (CBG) (Completed)   Lipid panel (Completed)   Microalbumin / creatinine urine ratio (Completed)   Hemoglobin A1c (Completed)   Comprehensive  metabolic panel (Completed)        Meds ordered this encounter  Medications   insulin degludec (TRESIBA FLEXTOUCH) 100 UNIT/ML FlexTouch Pen    Sig: Inject 10 Units into the skin daily.    Dispense:  3 mL    Refill:  4   insulin aspart (NOVOLOG) 100 UNIT/ML injection    Sig: As directed with sliding scale-- no more than 4x a day max 10 u each time    Dispense:  10 mL    Refill:  PRN   Insulin Pen Needle 31G X 5 MM MISC    Sig: As directed    Dispense:  100 each    Refill:  0    I, Dr. Roma Schanz, DO, personally preformed the services described in this documentation.  All medical record entries made by the scribe were at my direction and in my presence.  I have reviewed the chart and discharge instructions (if applicable) and agree that the record reflects my personal performance and is accurate and complete. 09/27/2020   I,Shehryar Baig,acting as a Education administrator for [employer], DO.,have documented all relevant documentation on the behalf of Ann Held, DO,as directed by  Ann Held, DO while in the presence of Ann Held, DO.   Ann Held, DO

## 2020-09-27 NOTE — Patient Instructions (Signed)
Sliding sliding   200-250   2 u  251-300  4 u  301-350   6 u 351-400   8 u  >400   10 u and call dr     F/u With Mickel Baas later this week

## 2020-09-28 ENCOUNTER — Encounter: Payer: Self-pay | Admitting: Family Medicine

## 2020-09-28 ENCOUNTER — Telehealth: Payer: Self-pay

## 2020-09-28 LAB — COMPREHENSIVE METABOLIC PANEL
ALT: 28 U/L (ref 0–53)
AST: 16 U/L (ref 0–37)
Albumin: 4.7 g/dL (ref 3.5–5.2)
Alkaline Phosphatase: 98 U/L (ref 39–117)
BUN: 24 mg/dL — ABNORMAL HIGH (ref 6–23)
CO2: 27 mEq/L (ref 19–32)
Calcium: 10.1 mg/dL (ref 8.4–10.5)
Chloride: 92 mEq/L — ABNORMAL LOW (ref 96–112)
Creatinine, Ser: 1.31 mg/dL (ref 0.40–1.50)
GFR: 60.23 mL/min (ref >60.00)
Glucose, Bld: 526 mg/dL (ref 70–99)
Potassium: 4.5 mEq/L (ref 3.5–5.1)
Sodium: 129 mEq/L — ABNORMAL LOW (ref 135–145)
Total Bilirubin: 0.5 mg/dL (ref 0.2–1.2)
Total Protein: 8 g/dL (ref 6.0–8.3)

## 2020-09-28 LAB — LIPID PANEL
Cholesterol: 184 mg/dL (ref 0–200)
HDL: 31.8 mg/dL — ABNORMAL LOW (ref >39.00)
Total CHOL/HDL Ratio: 6
Triglycerides: 586 mg/dL — ABNORMAL HIGH (ref 0.0–149.0)

## 2020-09-28 LAB — HEMOGLOBIN A1C: Hgb A1c MFr Bld: 12.1 % — ABNORMAL HIGH (ref 4.6–6.5)

## 2020-09-28 LAB — MICROALBUMIN / CREATININE URINE RATIO
Creatinine,U: 35.7 mg/dL
Microalb Creat Ratio: 2 mg/g (ref 0.0–30.0)
Microalb, Ur: <0.7 mg/dL (ref 0.0–1.9)

## 2020-09-28 LAB — LDL CHOLESTEROL, DIRECT: Direct LDL: 107 mg/dL

## 2020-09-28 NOTE — Telephone Encounter (Signed)
I was consulted about these results earlier today, the patient was essentially asymptomatic, today the blood sugar was in the 200s. Thank you

## 2020-09-28 NOTE — Telephone Encounter (Signed)
CRITICAL VALUE STICKER  CRITICAL VALUE: Glucose 526  RECEIVER (on-site recipient of call): Ayanni Tun  DATE & TIME NOTIFIED: 09/28/2020 at 12:20 pm  MESSENGER (representative from lab): Saa  MD NOTIFIED: Kathlene November, MD and Roma Schanz, DO  TIME OF NOTIFICATION: 12:24 pm  RESPONSE:

## 2020-09-28 NOTE — Telephone Encounter (Signed)
Pt has already been notified. See lab notes

## 2020-09-30 ENCOUNTER — Telehealth: Payer: Self-pay

## 2020-09-30 ENCOUNTER — Encounter: Payer: Self-pay | Admitting: Family

## 2020-09-30 NOTE — Telephone Encounter (Signed)
Pt called in states he has been taking insulin since 09/27/2020 and reading has not been under 172. Pt reading at 3:30am 257 and he ate a small bowl of cereal it was 297. Pt took an insulin after reading was 297 and 257. Pt ate a yogurt but has not taken again.

## 2020-10-04 ENCOUNTER — Ambulatory Visit: Payer: BC Managed Care – PPO | Admitting: Family
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
## 2020-10-06 ENCOUNTER — Other Ambulatory Visit: Payer: Self-pay

## 2020-10-06 ENCOUNTER — Ambulatory Visit: Payer: BC Managed Care – PPO | Admitting: Family

## 2020-10-06 ENCOUNTER — Other Ambulatory Visit: Payer: Self-pay | Admitting: Family

## 2020-10-06 ENCOUNTER — Encounter: Payer: Self-pay | Admitting: Family

## 2020-10-06 VITALS — BP 122/80 | HR 80 | Temp 98.8°F | Ht 72.0 in | Wt 213.4 lb

## 2020-10-06 DIAGNOSIS — E1165 Type 2 diabetes mellitus with hyperglycemia: Secondary | ICD-10-CM

## 2020-10-06 LAB — POCT GLYCOSYLATED HEMOGLOBIN (HGB A1C): Hemoglobin A1C: 11.4 % — AB (ref 4.0–5.6)

## 2020-10-06 LAB — GLUCOSE, POCT (MANUAL RESULT ENTRY): POC Glucose: 152 mg/dl — AB (ref 70–99)

## 2020-10-06 MED ORDER — INSULIN PEN NEEDLE 31G X 5 MM MISC: 0 refills | Status: DC

## 2020-10-06 MED ORDER — TRESIBA FLEXTOUCH 100 UNIT/ML ~~LOC~~ SOPN: PEN_INJECTOR | SUBCUTANEOUS | 1 refills | Status: DC

## 2020-10-06 NOTE — Progress Notes (Signed)
Austin Meadows is a 60 y.o. 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 90 tablet 3   blood glucose meter kit and supplies KIT Dispense based on patient and insurance preference. Use up to four times daily as directed. (FOR ICD-9 250.00, 250.01). 1 each 0   insulin aspart (NOVOLOG) 100 UNIT/ML injection As directed with sliding scale-- no more than 4x a day max 10 u each time 10 mL PRN   Insulin Pen Needle 31G X 5 MM MISC As directed 100 each 0   Lancets MISC Use to test blood sugars 2 times daily. 100 each 3   omeprazole (PRILOSEC) 40 MG capsule Take 40 mg by mouth as needed.     ONETOUCH ULTRA test strip USE TO CHECK GLUCOSE UP TO 4 TIMES DAILY AS DIRECTED     PROCTOFOAM HC rectal foam PLACE 1 APPLICATORFUL RECTALLY  THREE TIMES DAILY 10 g 0   gabapentin (NEURONTIN) 300 MG capsule Take 1 tablet 1-2 times daily as needed for pain. (Patient not taking: Reported on 10/06/2020) 60 capsule 5   insulin degludec (TRESIBA FLEXTOUCH) 100 UNIT/ML FlexTouch Pen Start with 10 units as directed; taper up to 20 units per night as directed 15 mL 1   meloxicam (MOBIC) 15 MG tablet Take 1 tablet (15 mg total) by mouth daily as needed for pain. Follow-up appt due in May must see provider for future refills (Patient not taking: Reported on 10/06/2020) 30 tablet 0   No current  facility-administered medications for this visit.    Allergies: Influenza vaccines, Metformin and related, and Penicillins  Past Medical History:  Diagnosis Date   Chicken pox    Diabetes mellitus without complication (HCC)    type 2   Hemorrhoid    Hyperlipidemia    Jaundice of newborn    Rheumatic fever    UTI (lower urinary tract infection)     Past Surgical History:  Procedure Laterality Date   COLONOSCOPY  ages [age]   done at [facility](pt unable to get records)    Family History  Problem Relation Age of Onset   Arthritis Mother    Cancer Mother        bone cancer   Colon cancer Father 3   Prostate cancer Father    Heart disease Father    Hypertension Father    Diabetes Father    Throat cancer Father    Colon cancer Brother 73       late 22's when dx   Cancer Brother        another cancer METS to brain   Colon cancer Brother 52   Colon cancer Other 61       Bill's daughter   Healthy Son        x 4   Healthy Daughter        x 1   Rectal cancer Neg Hx  Stomach cancer Neg Hx    Esophageal cancer Neg Hx     Social History   Tobacco Use   Smoking status: Former    Types: Cigarettes    Quit date: 06/04/1994    Years since quitting: 26.3   Smokeless tobacco: Never  Substance Use Topics   Alcohol use: Not Currently    Alcohol/week: 0.0 standard drinks    Comment: Occasionally (one drink per week).  Heavier drinking in the [employer] for ~ 8 years.     Subjective:  1 week follow up on sudden elevation in blood sugar/ uncontrolled Type 2 Diabetes;  Patient notes that he had actually cut his Metformin back to once her day in April due to GI side effects. He thought that everything was okay and had no concerns that his blood sugar was not controlled. On Tuesday of last week, he took his 2nd COVID booster and seemed to have almost instant reaction ( history of Guillain Josefa Half but has been able to tolerate all other COVID vaccines with no  issues); within 24 hours of vaccine, he was urinating almost every hour, felt dizzy, lightheaded, hot all over; blood sugar in office last Friday was in the 500s; today glucose is at 132 on his meter and 152 on our meter; notes he is feeling much better and "getting back to normal."   Objective:  Vitals:   10/06/20 1109  BP: 122/80  Pulse: 80  Temp: 98.8 F (37.1 C)  TempSrc: Oral  SpO2: 99%  Weight: 213 lb 6.4 oz (96.8 kg)  Height: 6' (1.829 m)    General: Well developed, well nourished, in no acute distress  Skin : Warm and dry.  Head: Normocephalic and atraumatic  Eyes: Sclera and conjunctiva clear; pupils round and reactive to light; extraocular movements intact  Ears: External normal; canals clear; tympanic membranes normal  Oropharynx: Pink, supple. No suspicious lesions  Neck: Supple without thyromegaly, adenopathy  Lungs: Respirations unlabored; clear to auscultation bilaterally without wheeze, rales, rhonchi  CVS exam: normal rate and regular rhythm.  Neurologic: Alert and oriented; speech intact; face symmetrical; moves all extremities well; CNII-XII intact without focal deficit  Assessment:  1. Uncontrolled type 2 diabetes mellitus with hyperglycemia ([place])     Plan:  Suspect sudden spike in blood sugar was reaction to COVID vaccine; for now, will continue [place] daily and sliding scale insulin with meals; he will taper up the [place] as directed with the hopes of being able to stop meal time insulin; will list Metformin as an allergy and he will stop taking this medication. Plan to re-check in 3 weeks- hopefully, will be able to transition to medication like Trulicity and/or [place];   Time spent 30 minutes  Return in about 3 weeks (around 10/27/2020).  Orders Placed This Encounter  Procedures   POCT HgB A1C   POCT glucose (manual entry)    Requested Prescriptions   Signed Prescriptions Disp Refills   insulin degludec (TRESIBA FLEXTOUCH) 100 UNIT/ML FlexTouch Pen  15 mL 1    Sig: Start with 10 units as directed; taper up to 20 units per night as directed

## 2020-10-06 NOTE — Patient Instructions (Signed)
For the [place] daily injection-  Start with 10 units as directed; increase by 2 units every 2 days until fasting sugars are averaging 120-125;  ( Example: 10 units tonight and tomorrow; 12 units on Saturday and Sunday);

## 2020-10-28 ENCOUNTER — Ambulatory Visit: Payer: BC Managed Care – PPO | Admitting: Family

## 2020-10-28 ENCOUNTER — Other Ambulatory Visit: Payer: Self-pay

## 2020-10-28 ENCOUNTER — Encounter: Payer: Self-pay | Admitting: Family

## 2020-10-28 VITALS — BP 122/80 | HR 69 | Temp 98.5°F | Ht 72.0 in | Wt 219.8 lb

## 2020-10-28 DIAGNOSIS — E1165 Type 2 diabetes mellitus with hyperglycemia: Secondary | ICD-10-CM

## 2020-10-28 MED ORDER — ONETOUCH ULTRA VI STRP: ORAL_STRIP | 1 refills | Status: DC

## 2020-10-28 MED ORDER — INSULIN ASPART 100 UNIT/ML IJ SOLN: INTRAMUSCULAR | 99 refills | Status: DC

## 2020-10-28 MED ORDER — TRULICITY 0.75 MG/0.5ML ~~LOC~~ SOAJ: 0.7500 mg | SUBCUTANEOUS | 1 refills | Status: DC

## 2020-10-28 NOTE — Progress Notes (Signed)
Austin Meadows is a 60 y.o. male with the following history as recorded in EpicCare:  Patient Active Problem List   Diagnosis Date Noted   Gastroesophageal reflux disease 11/28/2018   Erectile dysfunction 12/12/2017   Hypercholesteremia 12/12/2017   Routine general medical examination at a health care facility 04/12/2017   Adhesive capsulitis of left shoulder 02/05/2017   CIDP (chronic inflammatory demyelinating polyneuropathy) ([place]) 04/11/2015   Uncontrolled type 2 diabetes mellitus with hyperglycemia ([place]) 06/24/2014   Family history of colon cancer 06/09/2014    Current Outpatient Medications  Medication Sig Dispense Refill   atorvastatin (LIPITOR) 20 MG tablet Take 1 tablet (20 mg total) by mouth daily. 90 tablet 3   blood glucose meter kit and supplies KIT Dispense based on patient and insurance preference. Use up to four times daily as directed. (FOR ICD-9 250.00, 250.01). 1 each 0   Dulaglutide (TRULICITY) 9.89 QJ/1.9ER SOPN Inject 0.75 mg into the skin once a week. 2 mL 1   insulin degludec (TRESIBA FLEXTOUCH) 100 UNIT/ML FlexTouch Pen Start with 10 units as directed; taper up to 20 units per night as directed 15 mL 1   Insulin Pen Needle 31G X 5 MM MISC As directed 100 each 0   Lancets MISC Use to test blood sugars 2 times daily. 100 each 3   meloxicam (MOBIC) 15 MG tablet Take 1 tablet (15 mg total) by mouth daily as needed for pain. Follow-up appt due in May must see provider for future refills 30 tablet 0   omeprazole (PRILOSEC) 40 MG capsule Take 40 mg by mouth as needed.     PROCTOFOAM HC rectal foam PLACE 1 APPLICATORFUL RECTALLY  THREE TIMES DAILY 10 g 0   gabapentin (NEURONTIN) 300 MG capsule Take 1 tablet 1-2 times daily as needed for pain. (Patient not taking: No sig reported) 60 capsule 5   insulin aspart (NOVOLOG) 100 UNIT/ML injection As directed with sliding scale-- no more than 4x a day max 10 u each time 10 mL PRN   ONETOUCH ULTRA test strip USE TO CHECK GLUCOSE UP TO  4 TIMES DAILY AS DIRECTED 200 each 1   No current facility-administered medications for this visit.    Allergies: Influenza vaccines, Metformin and related, and Penicillins  Past Medical History:  Diagnosis Date   Chicken pox    Diabetes mellitus without complication (HCC)    type 2   Hemorrhoid    Hyperlipidemia    Jaundice of newborn    Rheumatic fever    UTI (lower urinary tract infection)     Past Surgical History:  Procedure Laterality Date   COLONOSCOPY  ages [age]   done at [facility] get records)    Family History  Problem Relation Age of Onset   Arthritis Mother    Cancer Mother        bone cancer   Colon cancer Father 40   Prostate cancer Father    Heart disease Father    Hypertension Father    Diabetes Father    Throat cancer Father    Colon cancer Brother 58       late 42's when dx   Cancer Brother        another cancer METS to brain   Colon cancer Brother 78   Colon cancer Other 52       Bill's daughter   Healthy Son        x 4   Healthy Daughter  x 1   Rectal cancer Neg Hx    Stomach cancer Neg Hx    Esophageal cancer Neg Hx     Social History   Tobacco Use   Smoking status: Former    Types: Cigarettes    Quit date: 06/04/1994    Years since quitting: 26.4   Smokeless tobacco: Never  Substance Use Topics   Alcohol use: Not Currently    Alcohol/week: 0.0 standard drinks    Comment: Occasionally (one drink per week).  Heavier drinking in the [employer] for ~ 8 years.     Subjective:   3 week follow up on Type 2 Diabetes; Is now taking 25 units of Tresiba at night; needing much less meal time insulin; no concerns for low blood sugar; Denies any chest pain, shortness of breath, blurred vision or headache.    Objective:  Vitals:   10/28/20 1346  BP: 122/80  Pulse: 69  Temp: 98.5 F (36.9 C)  TempSrc: Oral  SpO2: 99%  Weight: 219 lb 12.8 oz (99.7 kg)  Height: 6' (1.829 m)    General: Well  developed, well nourished, in no acute distress  Skin : Warm and dry.  Head: Normocephalic and atraumatic  Lungs: Respirations unlabored; clear to auscultation bilaterally without wheeze, rales, rhonchi  CVS exam: normal rate and regular rhythm.  Neurologic: Alert and oriented; speech intact; face symmetrical; moves all extremities well; CNII-XII intact without focal deficit   Assessment:  1. Uncontrolled type 2 diabetes mellitus with hyperglycemia (HCC)     Plan:  Continue Tresiba 25 units daily; will try to transition from meal time insulin to Trulicity weekly; follow up in 6 weeks, sooner prn.   This visit occurred during the SARS-CoV-2 public health emergency.  Safety protocols were in place, including screening questions prior to the visit, additional usage of staff PPE, and extensive cleaning of exam room while observing appropriate contact time as indicated for disinfecting solutions.    Return in about 6 weeks (around 12/09/2020).  Orders Placed This Encounter  Procedures   Basic Metabolic Panel (BMET)   Hemoglobin A1c    Requested Prescriptions   Signed Prescriptions Disp Refills   ONETOUCH ULTRA test strip 200 each 1    Sig: USE TO CHECK GLUCOSE UP TO 4 TIMES DAILY AS DIRECTED   insulin aspart (NOVOLOG) 100 UNIT/ML injection 10 mL PRN    Sig: As directed with sliding scale-- no more than 4x a day max 10 u each time   Dulaglutide (TRULICITY) 9.19 TY/6.0AY SOPN 2 mL 1    Sig: Inject 0.75 mg into the skin once a week.

## 2020-10-29 LAB — HEMOGLOBIN A1C
Hgb A1c MFr Bld: 9.9 % of total Hgb — ABNORMAL HIGH (ref <5.7)
Mean Plasma Glucose: 237 mg/dL
eAG (mmol/L): 13.2 mmol/L

## 2020-10-29 LAB — BASIC METABOLIC PANEL
BUN: 24 mg/dL (ref 7–25)
CO2: 27 mmol/L (ref 20–32)
Calcium: 9.4 mg/dL (ref 8.6–10.3)
Chloride: 104 mmol/L (ref 98–110)
Creat: 0.91 mg/dL (ref 0.70–1.30)
Glucose, Bld: 73 mg/dL (ref 65–99)
Potassium: 4.5 mmol/L (ref 3.5–5.3)
Sodium: 139 mmol/L (ref 135–146)

## 2020-11-01 ENCOUNTER — Other Ambulatory Visit: Payer: Self-pay | Admitting: Family

## 2020-11-01 ENCOUNTER — Telehealth: Payer: Self-pay

## 2020-11-01 MED ORDER — ONETOUCH ULTRA VI STRP: ORAL_STRIP | 1 refills | Status: DC

## 2020-11-01 MED ORDER — INSULIN LISPRO 100 UNIT/ML IJ SOLN: INTRAMUSCULAR | 2 refills | Status: DC

## 2020-11-01 NOTE — Telephone Encounter (Signed)
I have called pt to inform him that we have received the PA request and switched up the rx a bit so it will get covered.   I did want to ask about the Trulicity. Is he getting that already. I did get a message that it also may need a PA.

## 2020-11-01 NOTE — Telephone Encounter (Signed)
Patient called stating medications are requiring a PA and could not be refilled yet.   Froms for PA from cover my meds have been received and placed in providers folder up front for cma pick up.   Novolog Relion 100 units, and OneTouch ultra test strips.

## 2020-11-03 ENCOUNTER — Telehealth: Payer: Self-pay

## 2020-11-03 NOTE — Telephone Encounter (Signed)
Patient called to inquire about prior authorization needed for trulicity prescription. He was advised if pharmacy faxed the information the PA is probably being process by his provider's CMA.

## 2020-11-03 NOTE — Telephone Encounter (Signed)
I have called pt back to inform him that his Trulicity PA was approved. There was no answer so I left a VM stating that.

## 2020-11-03 NOTE — Telephone Encounter (Signed)
PA has been completed and there was an approval see other phone encounter.

## 2020-11-22 ENCOUNTER — Ambulatory Visit: Payer: BC Managed Care – PPO | Admitting: Internal Medicine

## 2020-11-22 NOTE — Progress Notes (Deleted)
Name: Austin Meadows  MRN/ DOB: [id number], Feb 05, 1961   Age/ Sex: 60 y.o., male    PCP: Marrian Salvage, [place]   Reason for Endocrinology Evaluation: Type 2 Diabetes Mellitus     Date of Initial Endocrinology Visit: 11/22/2020     PATIENT IDENTIFIER: Mr. Austin Meadows is a 60 y.o. male with a past medical history of ***. The patient presented for initial endocrinology clinic visit on 11/22/2020 for consultative assistance with his diabetes management.    HPI: Mr. Austin Meadows was    Diagnosed with DM 2016 Prior Medications tried/Intolerance: Metformin  Currently checking blood sugars *** x / day,  before breakfast and ***.  Hypoglycemia episodes : ***               Symptoms: ***                 Frequency: ***/  Hemoglobin A1c has ranged from 6.0% in 2016, peaking at 12.1% in 2022. Patient required assistance for hypoglycemia:  Patient has required hospitalization within the last 1 year from hyper or hypoglycemia:   In terms of diet, the patient ***   HOME DIABETES REGIMEN: Trulicity 0.80 mg daily  Tresiba  Humalog    Statin: yes ACE-I/ARB: no    METER DOWNLOAD SUMMARY: Date range evaluated: *** Fingerstick Blood Glucose Tests = *** Average Number Tests/Day = *** Overall Mean FS Glucose = *** Standard Deviation = ***  BG Ranges: Low = *** High = ***   Hypoglycemic Events/30 Days: BG < 50 = *** Episodes of symptomatic severe hypoglycemia = ***   DIABETIC COMPLICATIONS: Microvascular complications:  Neuropathy Denies: CKD Last eye exam: Completed   Macrovascular complications:   Denies: CAD, PVD, CVA   PAST HISTORY: Past Medical History:  Past Medical History:  Diagnosis Date   Chicken pox    Diabetes mellitus without complication ([place])    type 2   Hemorrhoid    Hyperlipidemia    Jaundice of newborn    Rheumatic fever    UTI (lower urinary tract infection)    Past Surgical History:  Past Surgical History:  Procedure Laterality Date   COLONOSCOPY   ages [age]   done at [facility] and [facility](pt unable to get records)    Social History:  reports that he quit smoking about 26 years ago. His smoking use included cigarettes. He has never used smokeless tobacco. He reports that he does not currently use alcohol. He reports that he does not use drugs. Family History:  Family History  Problem Relation Age of Onset   Arthritis Mother    Cancer Mother        bone cancer   Colon cancer Father 82   Prostate cancer Father    Heart disease Father    Hypertension Father    Diabetes Father    Throat cancer Father    Colon cancer Brother 7       late 62's when dx   Cancer Brother        another cancer METS to brain   Colon cancer Brother 65   Colon cancer Other 29       Bill's daughter   Healthy Son        x 4   Healthy Daughter        x 1   Rectal cancer Neg Hx    Stomach cancer Neg Hx    Esophageal cancer Neg Hx      HOME MEDICATIONS: Allergies as of  11/22/2020       Reactions   Influenza Vaccines Other (See Comments)   Guillain barre syndrome   Metformin And Related Diarrhea   Penicillins Other (See Comments)   Pt can't remember reaction.        Medication List        Accurate as of November 22, 2020  1:02 PM. If you have any questions, ask your nurse or doctor.          atorvastatin 20 MG tablet Commonly known as: LIPITOR Take 1 tablet (20 mg total) by mouth daily.   blood glucose meter kit and supplies Kit Dispense based on patient and insurance preference. Use up to four times daily as directed. (FOR ICD-9 250.00, 250.01).   gabapentin 300 MG capsule Commonly known as: NEURONTIN Take 1 tablet 1-2 times daily as needed for pain.   insulin lispro 100 UNIT/ML injection Commonly known as: HumaLOG Use as directed with sliding scale; no more than 4x per day and max 10 units each dose   Insulin Pen Needle 31G X 5 MM Misc As directed   Lancets Misc Use to test blood sugars 2 times  daily.   meloxicam 15 MG tablet Commonly known as: MOBIC Take 1 tablet (15 mg total) by mouth daily as needed for pain. Follow-up appt due in May must see provider for future refills   omeprazole 40 MG capsule Commonly known as: PRILOSEC Take 40 mg by mouth as needed.   OneTouch Ultra test strip Generic drug: glucose blood USE TO CHECK GLUCOSE UP TO 3 TIMES DAILY AS DIRECTED   Proctofoam HC rectal foam Generic drug: hydrocortisone-pramoxine PLACE 1 APPLICATORFUL RECTALLY  THREE TIMES DAILY   Tresiba FlexTouch 100 UNIT/ML FlexTouch Pen Generic drug: insulin degludec Start with 10 units as directed; taper up to 20 units per night as directed   Trulicity 9.35 TS/1.7BL Sopn Generic drug: Dulaglutide Inject 0.75 mg into the skin once a week.         ALLERGIES: Allergies  Allergen Reactions   Influenza Vaccines Other (See Comments)    Guillain barre syndrome   Metformin And Related Diarrhea   Penicillins Other (See Comments)    Pt can't remember reaction.     REVIEW OF SYSTEMS: A comprehensive ROS was conducted with the patient and is negative except as per HPI and below:  ROS    OBJECTIVE:   VITAL SIGNS: There were no vitals taken for this visit.   PHYSICAL EXAM:  General: Pt appears well and is in NAD  Hydration: Well-hydrated with moist mucous membranes and good skin turgor  HEENT: Head: Unremarkable with good dentition. Oropharynx clear without exudate.  Eyes: External eye exam normal without stare, lid lag or exophthalmos.  EOM intact.  PERRL.  Neck: General: Supple without adenopathy or carotid bruits. Thyroid: Thyroid size normal.  No goiter or nodules appreciated. No thyroid bruit.  Lungs: Clear with good BS bilat with no rales, rhonchi, or wheezes  Heart: RRR with normal S1 and S2 and no gallops; no murmurs; no rub  Abdomen: Normoactive bowel sounds, soft, nontender, without masses or organomegaly palpable  Extremities:  Lower extremities - No  pretibial edema. No lesions.  Skin: Normal texture and temperature to palpation. No rash noted. No Acanthosis nigricans/skin tags. No lipohypertrophy.  Neuro: MS is good with appropriate affect, pt is alert and Ox3    DM foot exam:    DATA REVIEWED:  Lab Results  Component Value Date   HGBA1C 9.9 (  H) 10/28/2020   HGBA1C 11.4 (A) 10/06/2020   HGBA1C 12.1 Repeated and verified X2. (H) 09/27/2020   Lab Results  Component Value Date   MICROALBUR <0.7 09/27/2020   [place] 95 12/23/2019   CREATININE 0.91 10/28/2020   Lab Results  Component Value Date   MICRALBCREAT 2.0 09/27/2020   Results for Austin Meadows, Austin Meadows (MRN [id number]) as of 11/22/2020 13:02  Ref. Range 09/27/2020 18:12  Total CHOL/HDL Ratio Unknown 6  Cholesterol Latest Ref Range: 0 - 200 mg/dL 184  HDL Cholesterol Latest Ref Range: >39.00 mg/dL 31.80 (L)  Direct LDL Latest Units: mg/dL 107.0  MICROALB/CREAT RATIO Latest Ref Range: 0.0 - 30.0 mg/g 2.0  Triglycerides Latest Ref Range: 0.0 - 149.0 mg/dL 586.0 Triglyceride is over 400; calculations on Lipids are invalid. (H)   Results for Austin Meadows, Austin Meadows (MRN [id number]) as of 11/22/2020 13:02  Ref. Range 10/28/2020 14:34  Sodium Latest Ref Range: 135 - 146 mmol/L 139  Potassium Latest Ref Range: 3.5 - 5.3 mmol/L 4.5  Chloride Latest Ref Range: 98 - 110 mmol/L 104  CO2 Latest Ref Range: 20 - 32 mmol/L 27  Glucose Latest Ref Range: 65 - 99 mg/dL 73  Mean Plasma Glucose Latest Units: mg/dL 237  BUN Latest Ref Range: 7 - 25 mg/dL 24  Creatinine Latest Ref Range: 0.70 - 1.30 mg/dL 0.91  Calcium Latest Ref Range: 8.6 - 10.3 mg/dL 9.4  BUN/Creatinine Ratio Latest Ref Range: 6 - 22 (calc) NOT APPLICABLE   ASSESSMENT / PLAN / RECOMMENDATIONS:   1) Type 2 Diabetes Mellitus, ***controlled, With*** complications - Most recent A1c of *** %. Goal A1c < 7.0 %.    Plan: GENERAL: ***  MEDICATIONS: ***  EDUCATION / INSTRUCTIONS: BG monitoring instructions: Patient is instructed to check  his blood sugars *** times a day, ***. Call [facility] if: BG persistently < 70 or > 300. I reviewed the Rule of 15 for the treatment of hypoglycemia in detail with the patient. Literature supplied.   2) Diabetic complications:  Eye: Does *** have known diabetic retinopathy.  Neuro/ Feet: Does *** have known diabetic peripheral neuropathy. Renal: Patient does *** have known baseline CKD. He is *** on an ACEI/ARB at present.      3) Mixed Dyslipidemia :   Labs from 09/2020 showed an LDL above goal at 107 mg/dL and Tg of 586 mg/dL       Signed electronically by: Mack Guise, MD  [facility] End[facility]inology  [address] [place], [place] [address] Phone: [phone] FAX: [phone]   CC: Marrian Salvage, [place] [address] [place] [address] Phone: [phone]  Fax: [phone]    Return to Endocrinology clinic as below: Future Appointments  Date Time Provider [place]  11/22/2020  3:00 PM Lister Brizzi, Melanie Crazier, MD [facility] [place]  12/09/2020  1:20 PM Marrian Salvage, FNP [facility]  01/13/2021  9:30 AM Narda Amber K, DO [facility] None

## 2020-11-26 ENCOUNTER — Other Ambulatory Visit: Payer: Self-pay | Admitting: Family

## 2020-12-09 ENCOUNTER — Encounter: Payer: Self-pay | Admitting: Family

## 2020-12-09 ENCOUNTER — Other Ambulatory Visit: Payer: Self-pay

## 2020-12-09 ENCOUNTER — Ambulatory Visit: Payer: BC Managed Care – PPO | Admitting: Family

## 2020-12-09 VITALS — BP 120/80 | HR 73 | Temp 98.1°F | Ht 72.0 in | Wt 221.6 lb

## 2020-12-09 DIAGNOSIS — E1165 Type 2 diabetes mellitus with hyperglycemia: Secondary | ICD-10-CM | POA: Diagnosis not present

## 2020-12-09 LAB — POCT GLYCOSYLATED HEMOGLOBIN (HGB A1C): Hemoglobin A1C: 6.8 % — AB (ref 4.0–5.6)

## 2020-12-09 NOTE — Progress Notes (Signed)
Austin Meadows is a 60 y.o. 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 90 tablet 3   blood glucose meter kit and supplies KIT Dispense based on patient and insurance preference. Use up to four times daily as directed. (FOR ICD-9 250.00, 250.01). 1 each 0   Dulaglutide (TRULICITY) 4.00 QQ/7.6PP SOPN Inject 0.75 mg into the skin once a week. 2 mL 1   insulin degludec (TRESIBA FLEXTOUCH) 100 UNIT/ML FlexTouch Pen Start with 10 units as directed; taper up to 20 units per night as directed 15 mL 1   insulin lispro (HUMALOG) 100 UNIT/ML injection Use as directed with sliding scale; no more than 4x per day and max 10 units each dose 10 mL 2   Insulin Pen Needle 31G X 5 MM MISC As directed 100 each 0   Lancets (ONETOUCH DELICA PLUS [id number]) MISC USE   TO CHECK GLUCOSE   UP TO 4 TIMES DAILY AS DIRECTED 400 each 12   meloxicam (MOBIC) 15 MG tablet Take 1 tablet (15 mg total) by mouth daily as needed for pain. Follow-up appt due in May must see provider for future refills 30 tablet 0   omeprazole (PRILOSEC) 40 MG capsule Take 40 mg by mouth as needed.     ONETOUCH ULTRA test strip USE TO CHECK GLUCOSE UP TO 3 TIMES DAILY AS DIRECTED 200 each 1   PROCTOFOAM HC rectal foam PLACE 1 APPLICATORFUL RECTALLY  THREE TIMES DAILY 10 g 0   gabapentin (NEURONTIN) 300 MG capsule  Take 1 tablet 1-2 times daily as needed for pain. (Patient not taking: No sig reported) 60 capsule 5   No current facility-administered medications for this visit.    Allergies: Influenza vaccines, Metformin and related, and Penicillins  Past Medical History:  Diagnosis Date   Chicken pox    Diabetes mellitus without complication ([facility])    type 2   Hemorrhoid    Hyperlipidemia    Jaundice of newborn    Rheumatic fever    UTI (lower urinary tract infection)     Past Surgical History:  Procedure Laterality Date   COLONOSCOPY  ages [age]   [facility]pt unable to get records)    Family History  Problem Relation Age of Onset   Arthritis Mother    Cancer Mother        bone cancer   Colon cancer Father 11   Prostate cancer Father    Heart disease Father    Hypertension Father    Diabetes Father    Throat cancer Father    Colon cancer Brother 25       late 65's when dx   Cancer Brother        another cancer METS to brain   Colon cancer Brother 63   Colon cancer Other 59       [name]'s daughter   Healthy Son  x 4   Healthy Daughter        x 1   Rectal cancer Neg Hx    Stomach cancer Neg Hx    Esophageal cancer Neg Hx     Social History   Tobacco Use   Smoking status: Former    Types: Cigarettes    Quit date: 06/04/1994    Years since quitting: 26.5   Smokeless tobacco: Never  Substance Use Topics   Alcohol use: Not Currently    Alcohol/week: 0.0 standard drinks    Comment: Occasionally (one drink per week).  Heavier drinking in the [employer] for ~ 8 years.     Subjective:   6 week follow up on Type 2 Diabetes- at last OV, transitioned to [place] and Trulicity; meal time insulin stopped;  Notes that overall, he is "feeling great." Admits he has woken up with some low readings at 64; Denies any chest pain, shortness of breath, blurred vision or headache    Objective:  Vitals:   12/09/20 1315  BP: 120/80  Pulse: 73  Temp:  98.1 F (36.7 C)  TempSrc: Oral  SpO2: 99%  Weight: 221 lb 9.6 oz (100.5 kg)  Height: 6' (1.829 m)    General: Well developed, well nourished, in no acute distress  Skin : Warm and dry.  Head: Normocephalic and atraumatic  Lungs: Respirations unlabored; clear to auscultation bilaterally without wheeze, rales, rhonchi  CVS exam: normal rate and regular rhythm.  Neurologic: Alert and oriented; speech intact; face symmetrical; moves all extremities well; CNII-XII intact without focal deficit  Assessment:  1. Uncontrolled type 2 diabetes mellitus with hyperglycemia ([place])     Plan:  Hgba1c in office today is 6.8; will go ahead and decrease [place] to 20 units daily- he will call back in 2 weeks with response and will most likely continue to decrease; goal will be to maintain on just Trulicity- stay on current dosage for now but will plan to increase Trulicity as insulin dosage is tapered. Will also cancel planned endocrine evaluation per patient request- agree that this is not necessary at this time.   This visit occurred during the SARS-CoV-2 public health emergency.  Safety protocols were in place, including screening questions prior to the visit, additional usage of staff PPE, and extensive cleaning of exam room while observing appropriate contact time as indicated for disinfecting solutions.    No follow-ups on file.  Orders Placed This Encounter  Procedures   POCT HgB A1C    Requested Prescriptions    No prescriptions requested or ordered in this encounter

## 2020-12-09 NOTE — Progress Notes (Signed)
ei

## 2020-12-09 NOTE — Patient Instructions (Signed)
Cut the insulin back to 20 units per night- let me hear from you in 2 weeks;

## 2020-12-13 ENCOUNTER — Ambulatory Visit: Payer: BC Managed Care – PPO | Admitting: Internal Medicine

## 2020-12-22 ENCOUNTER — Telehealth (INDEPENDENT_AMBULATORY_CARE_PROVIDER_SITE_OTHER): Payer: BC Managed Care – PPO | Admitting: Physician Assistant

## 2020-12-22 ENCOUNTER — Encounter: Payer: Self-pay | Admitting: Family

## 2020-12-22 VITALS — Ht 72.0 in

## 2020-12-22 DIAGNOSIS — R051 Acute cough: Secondary | ICD-10-CM

## 2020-12-22 MED ORDER — BENZONATATE 100 MG PO CAPS: 100.0000 mg | ORAL_CAPSULE | Freq: Three times a day (TID) | ORAL | 0 refills | Status: DC | PRN

## 2020-12-22 MED ORDER — AZITHROMYCIN 250 MG PO TABS: ORAL_TABLET | ORAL | 0 refills | Status: AC

## 2020-12-22 NOTE — Progress Notes (Signed)
Virtual Visit via Video Note  I connected with  Austin Meadows  on 12/22/20 at 12:30 PM EDT by a video enabled telemedicine application and verified that I am speaking with the correct person using two identifiers.  Location: Patient: home Provider: Therapist, music at [place] present: Patient and myself   I discussed the limitations of evaluation and management by telemedicine and the availability of in person appointments. The patient expressed understanding and agreed to proceed.   History of Present Illness: Chief complaint: Cough, congestion Symptom onset: 8 days ago (12/14/20) Pertinent positives: Sneezing, chills, ST, nausea, body aches  Pertinent negatives: Fever, CP, SOB, loss of taste or smell  Treatments tried: Mucinex, Delsym  Vaccine status: Pfizer x 2; cannot take influenza vaccines Sick exposure: Grandchildren recently ill   Home COVID-19 test was negative on 12/19/20    Observations/Objective:   Gen: Awake, alert, no acute distress; very congested, laying down on his couch Resp: Breathing is even and non-labored Psych: calm/pleasant demeanor Neuro: Alert and Oriented x 3, + facial symmetry, speech is clear.   Assessment and Plan: 1. Acute cough Persistent symptoms >8 days despite conservative efforts at home. Will Rx Z-pak at this time, take with food. Cautioned on antibiotic use and possible side effects. Tessalon perles for cough. Advised nasal saline, humidifier, and pushing fluids. Call if worse or no improvement.    Follow Up Instructions:    I discussed the assessment and treatment plan with the patient. The patient was provided an opportunity to ask questions and all were answered. The patient agreed with the plan and demonstrated an understanding of the instructions.   The patient was advised to call back or seek an in-person evaluation if the symptoms worsen or if the condition fails to improve as anticipated.  Karysa Heft M  Kayvon Mo, PA-C

## 2020-12-27 ENCOUNTER — Encounter: Payer: Self-pay | Admitting: Family

## 2020-12-27 ENCOUNTER — Other Ambulatory Visit: Payer: Self-pay | Admitting: Family

## 2020-12-27 MED ORDER — TRULICITY 0.75 MG/0.5ML ~~LOC~~ SOAJ
0.7500 mg | SUBCUTANEOUS | 2 refills | Status: DC
Start: 2020-12-27 — End: 2021-03-13

## 2021-01-02 ENCOUNTER — Other Ambulatory Visit: Payer: Self-pay | Admitting: Family

## 2021-01-02 DIAGNOSIS — E782 Mixed hyperlipidemia: Secondary | ICD-10-CM

## 2021-01-13 ENCOUNTER — Other Ambulatory Visit: Payer: Self-pay

## 2021-01-13 ENCOUNTER — Telehealth (INDEPENDENT_AMBULATORY_CARE_PROVIDER_SITE_OTHER): Payer: BC Managed Care – PPO | Admitting: Neurology

## 2021-01-13 DIAGNOSIS — Z029 Encounter for administrative examinations, unspecified: Secondary | ICD-10-CM

## 2021-01-13 DIAGNOSIS — G6181 Chronic inflammatory demyelinating polyneuritis: Secondary | ICD-10-CM

## 2021-01-13 NOTE — Progress Notes (Signed)
No show for virtual visit  

## 2021-01-16 ENCOUNTER — Encounter: Payer: Self-pay | Admitting: Neurology

## 2021-01-24 ENCOUNTER — Encounter: Payer: Self-pay | Admitting: Neurology

## 2021-02-23 ENCOUNTER — Other Ambulatory Visit: Payer: Self-pay | Admitting: Family

## 2021-02-23 ENCOUNTER — Telehealth: Payer: Self-pay | Admitting: Family

## 2021-02-23 NOTE — Telephone Encounter (Signed)
Rx refilled by provider  

## 2021-02-23 NOTE — Telephone Encounter (Signed)
Pt was requesting refill, he was informed this was not prescribed by Mickel Baas and she may need to see him or talk with him before filling this rx.   Medication: omeprazole (PRILOSEC) 40 MG capsule  Has the patient contacted their pharmacy? Yes.     Preferred Pharmacy: [facility] [address] ([address] [place]  [address] ([place]) [place] [address]  Phone:  [phone]  Fax:  [phone]

## 2021-03-13 ENCOUNTER — Other Ambulatory Visit: Payer: Self-pay | Admitting: Family

## 2021-04-19 ENCOUNTER — Other Ambulatory Visit: Payer: Self-pay | Admitting: Family

## 2021-05-03 ENCOUNTER — Telehealth: Payer: Self-pay | Admitting: Family

## 2021-05-03 NOTE — Telephone Encounter (Signed)
Patient states ever since he has been taking Trulicity he has been experiencing diarrhea, fatigue, and swimmy headed. He states he has been sugars and they have been ok, so he believes it's Trucility. He asked for a voicemail in case he does not answer.  ?

## 2021-05-05 NOTE — Telephone Encounter (Signed)
I have called the pt to gather more information. He stated that the diarrhea has corrected himself. Pt reports that he is fatigue and slight swimmy headed for the last several months, however he is still very functional with it. Pt also reports that he feels the spring on the needle is aggressive he does bleed for about 30 seconds every Sunday after the injections. ? ?He does have a Neurology appointment in April and pt stated that he will go to that.  ? ?Please advise if you have any additional recommendations at this time.  ? ? ? ?

## 2021-05-05 NOTE — Telephone Encounter (Signed)
I have called pt back and left a message to call back if he has any additional concerns. I have left a detailed message from the provider on his answering machine/voice mail.  ?

## 2021-05-05 NOTE — Telephone Encounter (Signed)
Noted. Thanks so much 

## 2021-05-05 NOTE — Chronic Care Management (AMB) (Signed)
Schedule pt for 05/11/2021 @ 230pm for face to face with pharmacist ?

## 2021-05-11 ENCOUNTER — Ambulatory Visit: Payer: BC Managed Care – PPO

## 2021-05-16 ENCOUNTER — Other Ambulatory Visit: Payer: Self-pay | Admitting: Family

## 2021-05-16 DIAGNOSIS — E782 Mixed hyperlipidemia: Secondary | ICD-10-CM

## 2021-05-17 ENCOUNTER — Other Ambulatory Visit: Payer: Self-pay | Admitting: Family

## 2021-06-12 ENCOUNTER — Telehealth (INDEPENDENT_AMBULATORY_CARE_PROVIDER_SITE_OTHER): Payer: BC Managed Care – PPO | Admitting: Neurology

## 2021-06-12 ENCOUNTER — Encounter: Payer: Self-pay | Admitting: Neurology

## 2021-06-12 VITALS — Ht 72.0 in | Wt 214.0 lb

## 2021-06-12 DIAGNOSIS — G6181 Chronic inflammatory demyelinating polyneuritis: Secondary | ICD-10-CM | POA: Diagnosis not present

## 2021-06-12 NOTE — Progress Notes (Signed)
? ?  Virtual Visit via Video Note ?The purpose of this virtual visit is to provide medical care while limiting exposure to the novel coronavirus.   ? ?Consent was obtained for video visit:  Yes.   ?Answered questions that patient had about telehealth interaction:  Yes.   ?I discussed the limitations, risks, security and privacy concerns of performing an evaluation and management service by telemedicine. I also discussed with the patient that there may be a patient responsible charge related to this service. The patient expressed understanding and agreed to proceed. ? ?Pt location: Home ?Physician Location: office ?Name of referring provider:  Marrian Salvage,* ?I connected with Austin Meadows at patients initiation/request on 06/12/2021 at  2:30 PM EDT by video enabled telemedicine application and verified that I am speaking with the correct person using two identifiers. ?Pt MRN:  [id number] ?Pt DOB:  Jun 12, 1960 ?Video Participants:  [name] ? ? ?History of Present Illness: This is a 61 y.o. male returning for sensory CIDP.  He has been doing great over the past year and denies any new neurological symptoms.  Rarely, he has mild numbness in the soles of the feet, but this is very infrequent.  Balance and strength is good.   ? ? ?Observations/Objective:   ?Vitals:  ? 06/12/21 1411  ?Weight: 214 lb (97.1 kg)  ?Height: 6' (1.829 m)  ? ?Patient is awake, alert, and appears comfortable.  Oriented x 4.   ?Extraocular muscles are intact. No ptosis.  Face is symmetric.  Speech is not dysarthric. ?Antigravity in all extremities.  No pronator drift. ?Gait appears normal.   ? ? 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  He is doing very well currently without any ongoing sensory complaints.  He will continue to monitor and contact me, if new symptoms arise. ? ? ?Follow Up Instructions: ?  ?I discussed the assessment and  treatment plan with the patient. The patient was provided an opportunity to ask questions and all were answered. The patient agreed with the plan and demonstrated an understanding of the instructions. ?  ?The patient was advised to call back or seek an in-person evaluation if the symptoms worsen or if the condition fails to improve as anticipated. ? ?Return to clinic in 1 year ? ? ?Alda Berthold, DO ?

## 2021-06-14 ENCOUNTER — Emergency Department (HOSPITAL_COMMUNITY): Payer: BC Managed Care – PPO

## 2021-06-14 ENCOUNTER — Ambulatory Visit
Admission: EM | Admit: 2021-06-14 | Discharge: 2021-06-14 | Disposition: A | Discharge status: Home / Self Care | Payer: BC Managed Care – PPO

## 2021-06-14 ENCOUNTER — Emergency Department (HOSPITAL_COMMUNITY)
Admission: EM | Admit: 2021-06-14 | Discharge: 2021-06-15 | Discharge status: Against Medical Advice | Payer: BC Managed Care – PPO | Attending: Emergency Medicine | Admitting: Emergency Medicine

## 2021-06-14 ENCOUNTER — Encounter (HOSPITAL_COMMUNITY): Payer: Self-pay | Admitting: *Deleted

## 2021-06-14 DIAGNOSIS — Z5321 Procedure and treatment not carried out due to patient leaving prior to being seen by health care provider: Secondary | ICD-10-CM | POA: Diagnosis not present

## 2021-06-14 DIAGNOSIS — W01198A Fall on same level from slipping, tripping and stumbling with subsequent striking against other object, initial encounter: Secondary | ICD-10-CM | POA: Diagnosis not present

## 2021-06-14 DIAGNOSIS — S0181XA Laceration without foreign body of other part of head, initial encounter: Secondary | ICD-10-CM | POA: Insufficient documentation

## 2021-06-14 DIAGNOSIS — Y9341 Activity, dancing: Secondary | ICD-10-CM | POA: Diagnosis not present

## 2021-06-14 DIAGNOSIS — S0181XD Laceration without foreign body of other part of head, subsequent encounter: Secondary | ICD-10-CM | POA: Diagnosis not present

## 2021-06-14 DIAGNOSIS — R519 Headache, unspecified: Secondary | ICD-10-CM | POA: Diagnosis not present

## 2021-06-14 DIAGNOSIS — W1809XA Striking against other object with subsequent fall, initial encounter: Secondary | ICD-10-CM | POA: Insufficient documentation

## 2021-06-14 DIAGNOSIS — S0502XA Injury of conjunctiva and corneal abrasion without foreign body, left eye, initial encounter: Secondary | ICD-10-CM | POA: Diagnosis not present

## 2021-06-14 DIAGNOSIS — S01112A Laceration without foreign body of left eyelid and periocular area, initial encounter: Secondary | ICD-10-CM | POA: Diagnosis not present

## 2021-06-14 DIAGNOSIS — M5459 Other low back pain: Secondary | ICD-10-CM | POA: Diagnosis not present

## 2021-06-14 DIAGNOSIS — M545 Low back pain, unspecified: Secondary | ICD-10-CM | POA: Insufficient documentation

## 2021-06-14 DIAGNOSIS — E119 Type 2 diabetes mellitus without complications: Secondary | ICD-10-CM | POA: Diagnosis not present

## 2021-06-14 DIAGNOSIS — Z23 Encounter for immunization: Secondary | ICD-10-CM | POA: Insufficient documentation

## 2021-06-14 DIAGNOSIS — Z794 Long term (current) use of insulin: Secondary | ICD-10-CM | POA: Diagnosis not present

## 2021-06-14 DIAGNOSIS — S0990XA Unspecified injury of head, initial encounter: Secondary | ICD-10-CM

## 2021-06-14 DIAGNOSIS — M542 Cervicalgia: Secondary | ICD-10-CM | POA: Diagnosis not present

## 2021-06-14 DIAGNOSIS — W19XXXA Unspecified fall, initial encounter: Secondary | ICD-10-CM

## 2021-06-14 DIAGNOSIS — R220 Localized swelling, mass and lump, head: Secondary | ICD-10-CM | POA: Diagnosis not present

## 2021-06-14 DIAGNOSIS — S0993XA Unspecified injury of face, initial encounter: Secondary | ICD-10-CM | POA: Diagnosis not present

## 2021-06-14 MED ORDER — TETANUS-DIPHTH-ACELL PERTUSSIS 5-2.5-18.5 LF-MCG/0.5 IM SUSY: 0.5000 mL | PREFILLED_SYRINGE | Freq: Once | INTRAMUSCULAR | Status: DC

## 2021-06-14 NOTE — Discharge Instructions (Signed)
Please go to the emergency department as soon as you leave urgent care for further evaluation and management. ?

## 2021-06-14 NOTE — ED Notes (Signed)
The patient informed this EMT that he was leaving to be seen at another facility ?

## 2021-06-14 NOTE — ED Triage Notes (Signed)
Pt presents with a cut to the lower part of the forehead. Pt states he fell last night.  ?

## 2021-06-14 NOTE — ED Provider Notes (Signed)
?[facility] ? ? ? ?CSN: [id number] ?Arrival date & time: 06/14/21  0948 ? ? ?  ? ?History   ?Chief Complaint ?Chief Complaint  ?Patient presents with  ? Laceration  ? ? ?HPI ?Austin Meadows is a 61 y.o. male.  ? ?Patient presents today due to further evaluation of head injury and facial lacerations.  Patient reports that he was dancing to music last night when he slipped on some water on the floor and fell hitting his face on the counter and then on the floor.  Patient has a laceration between eyebrows and underneath left lower eyelid.  Denies any vision changes.  Denies any associated headache, nausea, vomiting, dizziness, blurred vision. Denies taking blood thinners.  ? ? ?Laceration ? ?Past Medical History:  ?Diagnosis Date  ? Chicken pox   ? Diabetes mellitus without complication ([place])   ? type 2  ? Hemorrhoid   ? Hyperlipidemia   ? Jaundice of newborn   ? Rheumatic fever   ? UTI (lower urinary tract infection)   ? ? ?Patient Active Problem List  ? Diagnosis Date Noted  ? Gastroesophageal reflux disease 11/28/2018  ? Erectile dysfunction 12/12/2017  ? Hypercholesteremia 12/12/2017  ? Routine general medical examination at a health care facility 04/12/2017  ? 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 Uncontrolled type 2 diabetes mellitus with hyperglycemia ([place]) 06/24/2014  ? Family history of colon cancer 06/09/2014  ? ? ?Past Surgical History:  ?Procedure Laterality Date  ? COLONOSCOPY  ages [age]  ? [facility] at [facility](pt unable to get records)  ? ? ? ? ? ?Home Medications   ? ?Prior to Admission medications   ?Medication Sig Start Date End Date Taking? Authorizing Provider  ?atorvastatin (LIPITOR) 20 MG tablet Take 1 tablet by mouth once daily 05/16/21   Marrian Salvage, FNP  ?benzonatate (TESSALON PERLES) 100 MG capsule Take 1 capsule (100 mg total) by mouth 3 (three) times daily as  needed for cough. 12/22/20   Allwardt, Randa Evens, PA-C  ?blood glucose meter kit and supplies KIT Dispense based on patient and insurance preference. Use up to four times daily as directed. (FOR ICD-9 250.00, 250.01). 12/17/18   Marrian Salvage, [place]  ?Dulaglutide (TRULICITY) 5.78 IO/9.6EX SOPN Inject 0.75 mg into the skin once a week. 05/17/21   Marrian Salvage, FNP  ?gabapentin (NEURONTIN) 300 MG capsule Take 1 tablet 1-2 times daily as needed for pain. 06/18/19   Narda Amber K, DO  ?insulin degludec (TRESIBA FLEXTOUCH) 100 UNIT/ML FlexTouch Pen Start with 10 units as directed; taper up to 20 units per night as directed 10/06/20   Marrian Salvage, [place]  ?Insulin Pen Needle 31G X 5 MM MISC As directed 10/06/20   Marrian Salvage, FNP  ?Lancets (ONETOUCH DELICA PLUS [id number]) [place] USE   TO CHECK GLUCOSE   UP TO 4 TIMES DAILY AS DIRECTED 11/28/20   Marrian Salvage, FNP  ?meloxicam (MOBIC) 15 MG tablet Take 1 tablet (15 mg total) by mouth daily as needed for pain. Follow-up appt due in May must see provider for future refills 06/26/18   Biagio Borg, MD  ?omeprazole (PRILOSEC) 40 MG capsule Take 1 capsule by mouth once daily 02/23/21   Marrian Salvage, [place]  ?ONETOUCH ULTRA test strip USE TO CHECK GLUCOSE UP TO 3 TIMES DAILY AS DIRECTED 11/01/20   Marrian Salvage, FNP  ?PROCTOFOAM HC  rectal foam PLACE 1 APPLICATORFUL RECTALLY  THREE TIMES DAILY 01/11/20   Marrian Salvage, [place]  ? ? ?Family History ?Family History  ?Problem Relation Age of Onset  ? Arthritis Mother   ? Cancer Mother   ?     bone cancer  ? Colon cancer Father 41  ? Prostate cancer Father   ? Heart disease Father   ? Hypertension Father   ? Diabetes Father   ? Throat cancer Father   ? Colon cancer Brother 69  ?     late 101's when dx  ? Cancer Brother   ?     another cancer METS to brain  ? Colon cancer Brother 67  ? Colon cancer Other 28  ?     Bill's daughter  ? Healthy Son   ?     x 4  ? Healthy Daughter   ?      x 1  ? Rectal cancer Neg Hx   ? Stomach cancer Neg Hx   ? Esophageal cancer Neg Hx   ? ? ?Social History ?Social History  ? ?Tobacco Use  ? Smoking status: Former  ?  Types: Cigarettes  ?  Quit date: 06/04/1994  ?  Years since quitting: 27.0  ? Smokeless tobacco: Never  ?Vaping Use  ? Vaping Use: Never used  ?Substance Use Topics  ? Alcohol use: Not Currently  ?  Alcohol/week: 0.0 standard drinks  ?  Comment: Occasionally (one drink per week).  Heavier drinking in the [employer] for ~ 8 years.   ? Drug use: No  ? ? ? ?Allergies   ?Influenza vaccines, Metformin and related, and Penicillins ? ? ?Review of Systems ?Review of Systems ?Per HPI ? ?Physical Exam ?Triage Vital Signs ?ED Triage Vitals  ?Enc Vitals Group  ?   BP 06/14/21 1012 128/86  ?   Pulse Rate 06/14/21 1012 79  ?   Resp 06/14/21 1012 18  ?   Temp 06/14/21 1012 98.2 ?F (36.8 ?C)  ?   Temp Source 06/14/21 1012 Oral  ?   SpO2 06/14/21 1012 96 %  ?   Weight --   ?   Height --   ?   Head Circumference --   ?   Peak Flow --   ?   Pain Score 06/14/21 1011 0  ?   Pain Loc --   ?   Pain Edu? --   ?   Excl. in [place]? --   ? ?No data found. ? ?Updated Vital Signs ?BP 128/86 (BP Location: Left Arm)   Pulse 79   Temp 98.2 ?F (36.8 ?C) (Oral)   Resp 18   SpO2 96%  ? ?Visual Acuity ?Right Eye Distance:   ?Left Eye Distance:   ?Bilateral Distance:   ? ?Right Eye Near:   ?Left Eye Near:    ?Bilateral Near:    ? ?Physical Exam ?Constitutional:   ?   General: He is not in acute distress. ?   Appearance: Normal appearance. He is not toxic-appearing or diaphoretic.  ?HENT:  ?   Head: Normocephalic and atraumatic.  ?Eyes:  ?   Extraocular Movements: Extraocular movements intact.  ?   Conjunctiva/sclera: Conjunctivae normal.  ?Pulmonary:  ?   Effort: Pulmonary effort is normal.  ?Skin: ?   Comments: Approximately 2.5 cm linear laceration present between eyebrows with associated surrounding swelling.  Bleeding is controlled.  It does appear to be a deep laceration.  Unable to  visualize  entire depth of wound. ? ?Laceration/abrasion noted from medial corner of left eye that extends in a curvilinear fashion that is approximately 2.5 cm as well.  Bleeding is controlled with this.  Although, there is some significant swelling in lower lid that appears to be lower than normal.  Pupils are normal.  Vision appears normal.  ?Neurological:  ?   General: No focal deficit present.  ?   Mental Status: He is alert and oriented to person, place, and time. Mental status is at baseline.  ?   Cranial Nerves: Cranial nerves 2-12 are intact.  ?   Sensory: Sensation is intact.  ?   Motor: Motor function is intact.  ?   Coordination: Coordination is intact.  ?   Gait: Gait is intact.  ?Psychiatric:     ?   Mood and Affect: Mood normal.     ?   Behavior: Behavior normal.     ?   Thought Content: Thought content normal.     ?   Judgment: Judgment normal.  ? ? ? ?UC Treatments / Results  ?Labs ?(all labs ordered are listed, but only abnormal results are displayed) ?Labs Reviewed - No data to display ? ?EKG ? ? ?Radiology ?No results found. ? ?Procedures ?Procedures (including critical care time) ? ?Medications Ordered in UC ?Medications - No data to display ? ?Initial Impression / Assessment and Plan / UC Course  ?I have reviewed the triage vital signs and the nursing notes. ? ?Pertinent labs & imaging results that were available during my care of the patient were reviewed by me and considered in my medical decision making (see chart for details). ? ?  ? ?Due to head injury and associated left eye swelling, patient was advised that he will need to go to the hospital for further evaluation and management.  Possibly needs CT imaging of the head and more in depth closure and evaluation of lacerations that cannot be provided at urgent care. Viral signs and neuro exam stable at discharge. Patient left via self transport.  ?Final Clinical Impressions(s) / UC Diagnoses  ? ?Final diagnoses:  ?Injury of head, initial  encounter  ?Facial laceration, initial encounter  ? ? ? ?Discharge Instructions   ? ?  ?Please go to the emergency department as soon as you leave urgent care for further evaluation and management. ? ? ? ?ED

## 2021-06-14 NOTE — ED Triage Notes (Addendum)
Pt reports falling last night while dancing, hit head on the counter. Has laceration mid forehead, abrasion to left eye. Also has right side lower back pain, right neck pain. Reports +loc. ?

## 2021-06-14 NOTE — ED Provider Triage Note (Signed)
Emergency Medicine Provider Triage Evaluation Note ? ?Austin Meadows , a 61 y.o. male  was evaluated in triage.  Pt complains of fall. ? ?Review of Systems  ?Positive: Fall, syncope, facial lac ?Negative: Neck pain, n/v/d ? ?Physical Exam  ?BP (!) 151/98 (BP Location: Right Arm)   Pulse 80   Temp 98.2 ?F (36.8 ?C)   Resp 18   SpO2 100%  ?Gen:   Awake, no distress   ?Resp:  Normal effort  ?MSK:   Moves extremities without difficulty  ?Other:  Lac noted to mid forehead, and base of left orbit ? ?Medical Decision Making  ?Medically screening exam initiated at 12:27 PM.  Appropriate orders placed.  Austin Meadows was informed that the remainder of the evaluation will be completed by another provider, this initial triage assessment does not replace that evaluation, and the importance of remaining in the ED until their evaluation is complete. ? ?Pt slipped and fell to the ground, struck face, and report LOC last night.   ?  ?Domenic Moras, PA-C ?06/14/21 1231 ? ?

## 2021-06-15 ENCOUNTER — Emergency Department (HOSPITAL_BASED_OUTPATIENT_CLINIC_OR_DEPARTMENT_OTHER)
Admission: EM | Admit: 2021-06-15 | Discharge: 2021-06-15 | Disposition: A | Discharge status: Home / Self Care | Payer: BC Managed Care – PPO | Source: Home / Self Care | Attending: Emergency Medicine | Admitting: Emergency Medicine

## 2021-06-15 ENCOUNTER — Other Ambulatory Visit: Payer: Self-pay

## 2021-06-15 ENCOUNTER — Emergency Department (HOSPITAL_BASED_OUTPATIENT_CLINIC_OR_DEPARTMENT_OTHER): Payer: BC Managed Care – PPO

## 2021-06-15 ENCOUNTER — Encounter (HOSPITAL_BASED_OUTPATIENT_CLINIC_OR_DEPARTMENT_OTHER): Payer: Self-pay | Admitting: Emergency Medicine

## 2021-06-15 DIAGNOSIS — E119 Type 2 diabetes mellitus without complications: Secondary | ICD-10-CM | POA: Insufficient documentation

## 2021-06-15 DIAGNOSIS — S0502XA Injury of conjunctiva and corneal abrasion without foreign body, left eye, initial encounter: Secondary | ICD-10-CM | POA: Insufficient documentation

## 2021-06-15 DIAGNOSIS — Z23 Encounter for immunization: Secondary | ICD-10-CM | POA: Insufficient documentation

## 2021-06-15 DIAGNOSIS — Z794 Long term (current) use of insulin: Secondary | ICD-10-CM | POA: Insufficient documentation

## 2021-06-15 DIAGNOSIS — S0181XD Laceration without foreign body of other part of head, subsequent encounter: Secondary | ICD-10-CM | POA: Insufficient documentation

## 2021-06-15 DIAGNOSIS — M545 Low back pain, unspecified: Secondary | ICD-10-CM | POA: Insufficient documentation

## 2021-06-15 DIAGNOSIS — W01198D Fall on same level from slipping, tripping and stumbling with subsequent striking against other object, subsequent encounter: Secondary | ICD-10-CM | POA: Insufficient documentation

## 2021-06-15 DIAGNOSIS — M5459 Other low back pain: Secondary | ICD-10-CM | POA: Diagnosis not present

## 2021-06-15 DIAGNOSIS — S01112D Laceration without foreign body of left eyelid and periocular area, subsequent encounter: Secondary | ICD-10-CM | POA: Insufficient documentation

## 2021-06-15 DIAGNOSIS — M542 Cervicalgia: Secondary | ICD-10-CM | POA: Insufficient documentation

## 2021-06-15 MED ORDER — FLUORESCEIN SODIUM 1 MG OP STRP
1.0000 | ORAL_STRIP | Freq: Once | OPHTHALMIC | Status: AC
  Administered 2021-06-15: 1 via OPHTHALMIC
  Filled 2021-06-15: qty 1

## 2021-06-15 MED ORDER — TETANUS-DIPHTH-ACELL PERTUSSIS 5-2.5-18.5 LF-MCG/0.5 IM SUSY
0.5000 mL | PREFILLED_SYRINGE | Freq: Once | INTRAMUSCULAR | Status: AC
  Administered 2021-06-15: 0.5 mL via INTRAMUSCULAR
  Filled 2021-06-15: qty 0.5

## 2021-06-15 MED ORDER — KETOROLAC TROMETHAMINE 30 MG/ML IJ SOLN
30.0000 mg | Freq: Once | INTRAMUSCULAR | Status: AC
  Administered 2021-06-15: 30 mg via INTRAMUSCULAR
  Filled 2021-06-15: qty 1

## 2021-06-15 MED ORDER — TETRACAINE HCL 0.5 % OP SOLN
2.0000 [drp] | Freq: Once | OPHTHALMIC | Status: AC
  Administered 2021-06-15: 2 [drp] via OPHTHALMIC
  Filled 2021-06-15: qty 4

## 2021-06-15 MED ORDER — BACITRACIN ZINC 500 UNIT/GM EX OINT
TOPICAL_OINTMENT | Freq: Two times a day (BID) | CUTANEOUS | Status: DC
  Administered 2021-06-15: 31.5 via TOPICAL
  Filled 2021-06-15: qty 28.35

## 2021-06-15 MED ORDER — CIPROFLOXACIN HCL 0.3 % OP SOLN: 2.0000 [drp] | Freq: Four times a day (QID) | OPHTHALMIC | 0 refills | Status: AC

## 2021-06-15 NOTE — ED Triage Notes (Signed)
Pt here from home with c/o fall was seen at [facility] yesterday had ct done but was never seen , las lac to middle of face , also c/o lower back pain  ?

## 2021-06-15 NOTE — ED Notes (Signed)
Presents with back pain. Had a fall this past Tuesday night, points to area of right lower lumbar area, taking motrin to aid in resting. Laying on rt side increases pain, motrin does decrease pain and is able to sleep. Denies any numbness or tingling in either RLE or LLE. Has strong plantar and dorsal flexion. Color in lower extremities WNL, temp WNL, capillary refill WNL.  ?

## 2021-06-15 NOTE — Discharge Instructions (Addendum)
Please call Dr. Tobe Sos who is an ophthalmologist for evaluation of lower eyelid laceration and possible corneal abrasion. ?Please take ophthalmologic drops as prescribed. ?For your lower back pain, you can take ibuprofen 600 mg as needed. ?Please return for signs of infection over laceration such as redness, worsening swelling, fevers, or abnormal drainage. ?

## 2021-06-15 NOTE — ED Provider Notes (Signed)
?[facility] ?Provider Note ? ? ?CSN: [id number] ?Arrival date & time: 06/15/21  0900 ? ?  ? ?History ?PMH: DM2, CIDP, HLD ?No chief complaint on file. ? ? ?Austin Meadows is a 61 y.o. male. ?Presents the emergency department with a chief complaint of a fall that occurred Tuesday night.  He said he was dancing with his wife when he went into the kitchen and slipped on the wet floor.  He lost consciousness after the episode.  He thinks he may have hit the front of his face on the counter and then hit his left eye on the cabinet handle.  He recently went to urgent care yesterday morning.  He was referred to the emergency department at that time due to the loss of consciousness and the severity of the wounds on his face.  He was seen at [facility] emergency department where he had a initial triage provider evaluation.  He ended up having a head CT and maxillofacial CT that were normal.  He ended up leaving because the wait was so long.  He presents again today for the same issues.  He says that the wounds on his face are no longer bleeding.  He has no visual problems, headaches, confusion, numbness, weakness, speech changes.  Denies any foreign body sensation in his left eye.  He now states that he has some right lower back pain and right neck pain as well since the fall.  He has not taken anything for his pain.  It is worse with movements. Denies any saddle anesthesia, gait abnormalities, bowel or bladder dysfunction. ? ?HPI ? ?  ? ?Home Medications ?Prior to Admission medications   ?Medication Sig Start Date End Date Taking? Authorizing Provider  ?ciprofloxacin (CILOXAN) 0.3 % ophthalmic solution Place 2 drops into the left eye every 6 (six) hours for 5 days. Administer 1 drop, every 2 hours, while awake, for 2 days. Then 1 drop, every 4 hours, while awake, for the next 5 days. 06/15/21 06/20/21 Yes Jiyan Walkowski, Adora Fridge, PA-C  ?atorvastatin (LIPITOR) 20 MG tablet Take 1 tablet by mouth once daily  05/16/21   Marrian Salvage, FNP  ?benzonatate (TESSALON PERLES) 100 MG capsule Take 1 capsule (100 mg total) by mouth 3 (three) times daily as needed for cough. 12/22/20   Allwardt, Randa Evens, PA-C  ?blood glucose meter kit and supplies KIT Dispense based on patient and insurance preference. Use up to four times daily as directed. (FOR ICD-9 250.00, 250.01). 12/17/18   Marrian Salvage, [place]  ?Dulaglutide (TRULICITY) 7.82 UM/3.5TI SOPN Inject 0.75 mg into the skin once a week. 05/17/21   Marrian Salvage, FNP  ?gabapentin (NEURONTIN) 300 MG capsule Take 1 tablet 1-2 times daily as needed for pain. 06/18/19   Narda Amber K, DO  ?insulin degludec (TRESIBA FLEXTOUCH) 100 UNIT/ML FlexTouch Pen Start with 10 units as directed; taper up to 20 units per night as directed 10/06/20   Marrian Salvage, [place]  ?Insulin Pen Needle 31G X 5 MM MISC As directed 10/06/20   Marrian Salvage, FNP  ?Lancets (ONETOUCH DELICA PLUS [id number]) [place] USE   TO CHECK GLUCOSE   UP TO 4 TIMES DAILY AS DIRECTED 11/28/20   Marrian Salvage, FNP  ?meloxicam (MOBIC) 15 MG tablet Take 1 tablet (15 mg total) by mouth daily as needed for pain. Follow-up appt due in May must see provider for future refills 06/26/18   Biagio Borg, MD  ?omeprazole (PRILOSEC) 40 MG  capsule Take 1 capsule by mouth once daily 02/23/21   Marrian Salvage, [place]  ?ONETOUCH ULTRA test strip USE TO CHECK GLUCOSE UP TO 3 TIMES DAILY AS DIRECTED 11/01/20   Marrian Salvage, FNP  ?PROCTOFOAM HC rectal foam PLACE 1 APPLICATORFUL RECTALLY  THREE TIMES DAILY 01/11/20   Marrian Salvage, [place]  ?   ? ?Allergies    ?Influenza vaccines, Metformin and related, and Penicillins   ? ?Review of Systems   ?Review of Systems  ?Musculoskeletal:  Positive for back pain and neck pain.  ?Skin:  Positive for wound.  ?All other systems reviewed and are negative. ? ?Physical Exam ?Updated Vital Signs ?BP (!) 145/85   Pulse 69   Temp 98.3 ?F (36.8 ?C) (Oral)    Resp 16   SpO2 99%  ?Physical Exam ?Vitals and nursing note reviewed.  ?Constitutional:   ?   General: He is not in acute distress. ?   Appearance: Normal appearance. He is well-developed. He is not ill-appearing, toxic-appearing or diaphoretic.  ?HENT:  ?   Head: Normocephalic and atraumatic.  ?   Nose: No nasal deformity.  ?   Mouth/Throat:  ?   Lips: Pink. No lesions.  ?Eyes:  ?   General: Gaze aligned appropriately. No scleral icterus.    ?   Right eye: No discharge.     ?   Left eye: No discharge.  ?   Extraocular Movements: Extraocular movements intact.  ?   Conjunctiva/sclera: Conjunctivae normal.  ?   Right eye: Right conjunctiva is not injected. No exudate or hemorrhage. ?   Left eye: Left conjunctiva is not injected. No exudate or hemorrhage. ?   Pupils: Pupils are equal, round, and reactive to light.  ?   Comments: EOMs are intact with no evidence of treatment.  There is no conjunctival erythema or injection.  No abnormal drainage.  There is a small laceration that extends into the lacrimal duct.  Moderate periorbital edema noted.  ?Pulmonary:  ?   Effort: Pulmonary effort is normal. No respiratory distress.  ?Musculoskeletal:  ?   Comments: No midline tenderness of spine, no stepoff or deformity; reproducible muscular tenderness in right paraspinal muscles ?DP/PT pulses 2+ and equal bilaterally ?No leg edema ?Sensation grossly intact on anterior thighs, dorsum of foot and lateral foot ?Strength of knee flexion and extension is 5/5 ?Plantar and dorsiflexion of ankle 5/5 ?Gait normal  ?Skin: ?   General: Skin is warm and dry.  ?   Comments: Laceration is approximately 1 cm between eyebrows at the bridge of nose. ?Another laceration is present inferior to left lower eyelid.  It looks like it partially extends into the lacrimal corner.   ?Neurological:  ?   General: No focal deficit present.  ?   Mental Status: He is alert and oriented to person, place, and time.  ?   Comments: Alert and Oriented x  3 ?Speech clear with no aphasia ?Cranial Nerve testing ?- PERRLA. EOM intact. No Nystagmus ?- No facial asymmetry ?Motor: ?- 5/5 motor strength in all four extremities.  ?Sensation: ?- Grossly intact in all four extremities.  ?Coordination:  ?- Gait without abnormality. ?  ?Psychiatric:     ?   Mood and Affect: Mood normal.     ?   Speech: Speech normal.     ?   Behavior: Behavior normal. Behavior is cooperative.  ? ? ? ? ? ? ?ED Results / Procedures / Treatments   ?Labs ?(all  labs ordered are listed, but only abnormal results are displayed) ?Labs Reviewed - No data to display ? ?EKG ?None ? ?Radiology ?DG Lumbar Spine Complete ? ?Result Date: 06/15/2021 ?CLINICAL DATA:  Recent fall 2 days ago, low back pain EXAM: LUMBAR SPINE - COMPLETE 4+ VIEW COMPARISON:  02/15/2015 FINDINGS: Normal alignment without acute osseous finding, compression fracture, focal kyphosis. Preserved vertebral body heights. Large anterior osteophytes at L3-4. Overall preserved disc spaces. Facets are aligned. No pars defects. Normal appearing pedicles and SI joints for age. IMPRESSION: No acute abnormality.  Degenerative changes as above. Electronically Signed   By: Jerilynn Mages.  Shick M.D.   On: 06/15/2021 09:45  ? ?CT Head Wo Contrast ? ?Result Date: 06/14/2021 ?CLINICAL DATA:  Head trauma, moderate-severe EXAM: CT HEAD WITHOUT CONTRAST CT MAXILLOFACIAL WITHOUT CONTRAST TECHNIQUE: Multidetector CT imaging of the head and maxillofacial structures were performed using the standard protocol without intravenous contrast. Multiplanar CT image reconstructions of the maxillofacial structures were also generated. RADIATION DOSE REDUCTION: This exam was performed according to the departmental dose-optimization program which includes automated exposure control, adjustment of the mA and/or kV according to patient size and/or use of iterative reconstruction technique. COMPARISON:  None. FINDINGS: CT HEAD FINDINGS Brain: No acute intracranial hemorrhage, mass  effect, or edema. Gray-white differentiation is preserved. Ventricles and sulci are normal in size and configuration. There is no extra-axial collection. Vascular: There is intracranial atherosclerotic calcification at the

## 2021-06-16 DIAGNOSIS — S0012XA Contusion of left eyelid and periocular area, initial encounter: Secondary | ICD-10-CM | POA: Diagnosis not present

## 2021-06-19 DIAGNOSIS — E119 Type 2 diabetes mellitus without complications: Secondary | ICD-10-CM | POA: Diagnosis not present

## 2021-07-06 ENCOUNTER — Telehealth: Payer: Self-pay | Admitting: Family

## 2021-07-06 NOTE — Telephone Encounter (Signed)
Patient states he has never gotten the freestyle libra but he is willing to try it if that's the one that does not require a fingerstick. Please advise.  ?

## 2021-07-06 NOTE — Telephone Encounter (Signed)
Are we able to send in the freestyle for the pt to try/ see if his insurance will cover? ?

## 2021-07-06 NOTE — Telephone Encounter (Signed)
I have called the pt back and informed him that we can send him a new glucometer or a replacement if he wants. I was going to also ask if he has tried to get the freestyle lebria. There was no answer so I left a message to call back. ?

## 2021-07-06 NOTE — Telephone Encounter (Signed)
Pt called stating that he needed a replacement Verio 100 as his has broken and won't prick his finger anymore. He also stated he would be willing to look into alternatives where he didn't have to prick his finger every day, if possible. Please Advise.  ?

## 2021-07-07 ENCOUNTER — Other Ambulatory Visit: Payer: Self-pay | Admitting: Family

## 2021-07-07 MED ORDER — FREESTYLE LIBRE 2 READER DEVI: 1 refills | Status: DC

## 2021-07-07 MED ORDER — FREESTYLE LIBRE 2 SENSOR MISC: 1 refills | Status: DC

## 2021-07-07 NOTE — Telephone Encounter (Signed)
I have called pt and let him know that we have sent in the freestyle for him and we will see if insurance will cover it. Also he has an appointment on Tue and we will check his A1c at that time. Left a message on his VM.  ?

## 2021-07-11 ENCOUNTER — Ambulatory Visit: Payer: BC Managed Care – PPO | Admitting: Family

## 2021-07-11 VITALS — BP 142/80 | HR 63 | Temp 97.7°F | Resp 18 | Ht 72.0 in | Wt 214.0 lb

## 2021-07-11 DIAGNOSIS — E119 Type 2 diabetes mellitus without complications: Secondary | ICD-10-CM | POA: Diagnosis not present

## 2021-07-11 DIAGNOSIS — H6983 Other specified disorders of Eustachian tube, bilateral: Secondary | ICD-10-CM | POA: Diagnosis not present

## 2021-07-11 LAB — COMPREHENSIVE METABOLIC PANEL
ALT: 20 U/L (ref 0–53)
AST: 16 U/L (ref 0–37)
Albumin: 4.5 g/dL (ref 3.5–5.2)
Alkaline Phosphatase: 45 U/L (ref 39–117)
BUN: 19 mg/dL (ref 6–23)
CO2: 30 mEq/L (ref 19–32)
Calcium: 9.1 mg/dL (ref 8.4–10.5)
Chloride: 102 mEq/L (ref 96–112)
Creatinine, Ser: 0.96 mg/dL (ref 0.40–1.50)
GFR: 86.98 mL/min (ref >60.00)
Glucose, Bld: 101 mg/dL — ABNORMAL HIGH (ref 70–99)
Potassium: 5 mEq/L (ref 3.5–5.1)
Sodium: 138 mEq/L (ref 135–145)
Total Bilirubin: 0.6 mg/dL (ref 0.2–1.2)
Total Protein: 6.8 g/dL (ref 6.0–8.3)

## 2021-07-11 LAB — CBC WITH DIFFERENTIAL/PLATELET
Basophils Absolute: 0.1 10*3/uL (ref 0.0–0.1)
Basophils Relative: 1.1 % (ref 0.0–3.0)
Eosinophils Absolute: 0.2 10*3/uL (ref 0.0–0.7)
Eosinophils Relative: 3.7 % (ref 0.0–5.0)
HCT: 44.8 % (ref 39.0–52.0)
Hemoglobin: 14.6 g/dL (ref 13.0–17.0)
Lymphocytes Relative: 33.1 % (ref 12.0–46.0)
Lymphs Abs: 1.5 10*3/uL (ref 0.7–4.0)
MCHC: 32.6 g/dL (ref 30.0–36.0)
MCV: 85.6 fl (ref 78.0–100.0)
Monocytes Absolute: 0.4 10*3/uL (ref 0.1–1.0)
Monocytes Relative: 9.3 % (ref 3.0–12.0)
Neutro Abs: 2.4 10*3/uL (ref 1.4–7.7)
Neutrophils Relative %: 52.8 % (ref 43.0–77.0)
Platelets: 221 10*3/uL (ref 150.0–400.0)
RBC: 5.23 Mil/uL (ref 4.22–5.81)
RDW: 13.9 % (ref 11.5–15.5)
WBC: 4.6 10*3/uL (ref 4.0–10.5)

## 2021-07-11 LAB — HEMOGLOBIN A1C: Hgb A1c MFr Bld: 5.9 % (ref 4.6–6.5)

## 2021-07-11 MED ORDER — FLUTICASONE PROPIONATE 50 MCG/ACT NA SUSP: 2.0000 | Freq: Every day | NASAL | 6 refills | Status: DC

## 2021-07-11 NOTE — Patient Instructions (Signed)
Hold the Trulicity for now; we'll check on you around 5/22;  ? ?Eustachian Tube Dysfunction ? ?Eustachian tube dysfunction refers to a condition in which a blockage develops in the narrow passage that connects the middle ear to the back of the nose (eustachian tube). The eustachian tube regulates air pressure in the middle ear by letting air move between the ear and nose. It also helps to drain fluid from the middle ear space. ?Eustachian tube dysfunction can affect one or both ears. When the eustachian tube does not function properly, air pressure, fluid, or both can build up in the middle ear. ?What are the causes? ?This condition occurs when the eustachian tube becomes blocked or cannot open normally. Common causes of this condition include: ?Ear infections. ?Colds and other infections that affect the nose, mouth, and throat (upper respiratory tract). ?Allergies. ?Irritation from cigarette smoke. ?Irritation from stomach acid coming up into the esophagus (gastroesophageal reflux). The esophagus is the part of the body that moves food from the mouth to the stomach. ?Sudden changes in air pressure, such as from descending in an airplane or scuba diving. ?Abnormal growths in the nose or throat, such as: ?Growths that line the nose (nasal polyps). ?Abnormal growth of cells (tumors). ?Enlarged tissue at the back of the throat (adenoids). ?What increases the risk? ?You are more likely to develop this condition if: ?You smoke. ?You are overweight. ?You are a child who has: ?Certain birth defects of the mouth, such as cleft palate. ?Large tonsils or adenoids. ?What are the signs or symptoms? ?Common symptoms of this condition include: ?A feeling of fullness in the ear. ?Ear pain. ?Clicking or popping noises in the ear. ?Ringing in the ear (tinnitus). ?Hearing loss. ?Loss of balance. ?Dizziness. ?Symptoms may get worse when the air pressure around you changes, such as when you travel to an area of high elevation, fly on  an airplane, or go scuba diving. ?How is this diagnosed? ?This condition may be diagnosed based on: ?Your symptoms. ?A physical exam of your ears, nose, and throat. ?Tests, such as those that measure: ?The movement of your eardrum. ?Your hearing (audiometry). ?How is this treated? ?Treatment depends on the cause and severity of your condition. ?In mild cases, you may relieve your symptoms by moving air into your ears. This is called "popping the ears." ?In more severe cases, or if you have symptoms of fluid in your ears, treatment may include: ?Medicines to relieve congestion (decongestants). ?Medicines that treat allergies (antihistamines). ?Nasal sprays or ear drops that contain medicines that reduce swelling (steroids). ?A procedure to drain the fluid in your eardrum. In this procedure, a small tube may be placed in the eardrum to: ?Drain the fluid. ?Restore the air in the middle ear space. ?A procedure to insert a balloon device through the nose to inflate the opening of the eustachian tube (balloon dilation). ?Follow these instructions at home: ?Lifestyle ?Do not do any of the following until your health care provider approves: ?Travel to high altitudes. ?Fly in airplanes. ?Work in a Pension scheme manager or room. ?Scuba dive. ?Do not use any products that contain nicotine or tobacco. These products include cigarettes, chewing tobacco, and vaping devices, such as e-cigarettes. If you need help quitting, ask your health care provider. ?Keep your ears dry. Wear fitted earplugs during showering and bathing. Dry your ears completely after. ?General instructions ?Take over-the-counter and prescription medicines only as told by your health care provider. ?Use techniques to help pop your ears as recommended by  your health care provider. These may include: ?Chewing gum. ?Yawning. ?Frequent, forceful swallowing. ?Closing your mouth, holding your nose closed, and gently blowing as if you are trying to blow air out of your  nose. ?Keep all follow-up visits. This is important. ?Contact a health care provider if: ?Your symptoms do not go away after treatment. ?Your symptoms come back after treatment. ?You are unable to pop your ears. ?You have: ?A fever. ?Pain in your ear. ?Pain in your head or neck. ?Fluid draining from your ear. ?Your hearing suddenly changes. ?You become very dizzy. ?You lose your balance. ?Get help right away if: ?You have a sudden, severe increase in any of your symptoms. ?Summary ?Eustachian tube dysfunction refers to a condition in which a blockage develops in the eustachian tube. ?It can be caused by ear infections, allergies, inhaled irritants, or abnormal growths in the nose or throat. ?Symptoms may include ear pain or fullness, hearing loss, or ringing in the ears. ?Mild cases are treated with techniques to unblock the ears, such as yawning or chewing gum. ?More severe cases are treated with medicines or procedures. ?This information is not intended to replace advice given to you by your health care provider. Make sure you discuss any questions you have with your health care provider. ?Document Revised: 05/02/2020 Document Reviewed: 05/02/2020 ?Elsevier Patient Education ? [place]. ? ?

## 2021-07-11 NOTE — Progress Notes (Signed)
?Austin Meadows is a 61 y.o. male with the following history as recorded in EpicCare:  ?Patient Active Problem List  ? Diagnosis Date Noted  ? Gastroesophageal reflux disease 11/28/2018  ? Erectile dysfunction 12/12/2017  ? Hypercholesteremia 12/12/2017  ? Routine general medical examination at a health care facility 04/12/2017  ? 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 Uncontrolled type 2 diabetes mellitus with hyperglycemia ([place]) 06/24/2014  ? Family history of colon cancer 06/09/2014  ?  ?Current Outpatient Medications  ?Medication Sig Dispense Refill  ? atorvastatin (LIPITOR) 20 MG tablet Take 1 tablet by mouth once daily 90 tablet 0  ? blood glucose meter kit and supplies KIT Dispense based on patient and insurance preference. Use up to four times daily as directed. (FOR ICD-9 250.00, 250.01). 1 each 0  ? Continuous Blood Gluc Receiver (FREESTYLE LIBRE 2 READER) DEVI Use as directed to check blood sugar 1 each 1  ? Continuous Blood Gluc Sensor (FREESTYLE LIBRE 2 SENSOR) MISC Use as directed to check blood sugar 1 each 1  ? Dulaglutide (TRULICITY) 0.27 OZ/3.6UY SOPN Inject 0.75 mg into the skin once a week. 4 mL 3  ? fluticasone (FLONASE) 50 MCG/ACT nasal spray Place 2 sprays into both nostrils daily. 16 g 6  ? Insulin Pen Needle 31G X 5 MM MISC As directed 100 each 0  ? Lancets (ONETOUCH DELICA PLUS [id number]) MISC USE   TO CHECK GLUCOSE   UP TO 4 TIMES DAILY AS DIRECTED 400 each 12  ? omeprazole (PRILOSEC) 40 MG capsule Take 1 capsule by mouth once daily 90 capsule 0  ? ONETOUCH ULTRA test strip USE TO CHECK GLUCOSE UP TO 3 TIMES DAILY AS DIRECTED 200 each 1  ? PROCTOFOAM HC rectal foam PLACE 1 APPLICATORFUL RECTALLY  THREE TIMES DAILY 10 g 0  ? ?No current facility-administered medications for this visit.  ?  ?Allergies: Influenza vaccines, Metformin and related, and Penicillins  ?Past Medical History:  ?Diagnosis Date  ?  Chicken pox   ? Diabetes mellitus without complication ([place])   ? type 2  ? Hemorrhoid   ? Hyperlipidemia   ? Jaundice of newborn   ? Rheumatic fever   ? UTI (lower urinary tract infection)   ?  ?Past Surgical History:  ?Procedure Laterality Date  ? COLONOSCOPY  ages [age]  ? [facility](pt unable to get records)  ?  ?Family History  ?Problem Relation Age of Onset  ? Arthritis Mother   ? Cancer Mother   ?     bone cancer  ? Colon cancer Father 66  ? Prostate cancer Father   ? Heart disease Father   ? Hypertension Father   ? Diabetes Father   ? Throat cancer Father   ? Colon cancer Brother 29  ?     late 59's when dx  ? Cancer Brother   ?     another cancer METS to brain  ? Colon cancer Brother 58  ? Colon cancer Other 28  ?     Bill's daughter  ? Healthy Son   ?     x 4  ? Healthy Daughter   ?     x 1  ? Rectal cancer Neg Hx   ? Stomach cancer Neg Hx   ? Esophageal cancer Neg Hx   ?  ?Social History  ? ?Tobacco Use  ? Smoking status: Former  ?  Types: Cigarettes  ?  Quit date: 06/04/1994  ?  Years since quitting: 27.1  ? Smokeless tobacco: Never  ?Substance Use Topics  ? Alcohol use: Not Currently  ?  Alcohol/week: 0.0 standard drinks  ?  Comment: Occasionally (one drink per week).  Heavier drinking in the [employer] for ~ 8 years.   ?  ?Subjective:  ?Follow up on Type 2 Diabetes; is concerned that not tolerating the Trulicity well; feels like appetite is down and having episodes of blood sugar; notes that fasting blood sugar 70s-80s;  ? ?Also concerned that ears feel full- concerned that needs ear wax; ? ? ?Objective:  ?Vitals:  ? 07/11/21 0819  ?BP: (!) 142/80  ?Pulse: 63  ?Resp: 18  ?Temp: 97.7 ?F (36.5 ?C)  ?TempSrc: Oral  ?SpO2: 97%  ?Weight: 214 lb (97.1 kg)  ?Height: 6' (1.829 m)  ?  ?General: Well developed, well nourished, in no acute distress  ?Skin : Warm and dry.  ?Head: Normocephalic and atraumatic  ?Eyes: Sclera and conjunctiva clear; pupils round and reactive to light;  extraocular movements intact  ?Ears: External normal; canals clear; tympanic membranes congested bilateral ?Oropharynx: Pink, supple. No suspicious lesions  ?Neck: Supple without thyromegaly, adenopathy  ?Lungs: Respirations unlabored; clear to auscultation bilaterally without wheeze, rales, rhonchi  ?CVS exam: normal rate and regular rhythm.  ?Neurologic: Alert and oriented; speech intact; face symmetrical; moves all extremities well; CNII-XII intact without focal deficit  ? ?Assessment:  ?1. Type 2 diabetes mellitus without complication, without long-term current use of insulin ([place])   ?2. Dysfunction of both eustachian tubes   ?  ?Plan:  ?Concern for hypoglycemia on Trulicity; will update labs today; hold Trulicity for now and will see how his sugars are doing at the end of next week; will most likely need to consider other options;  ?Trial of Flonase;  ? ?No follow-ups on file.  ?Orders Placed This Encounter  ?Procedures  ? CBC with Differential/Platelet  ? Comp Met (CMET)  ? Hemoglobin A1c  ?  ?Requested Prescriptions  ? ?Signed Prescriptions Disp Refills  ? fluticasone (FLONASE) 50 MCG/ACT nasal spray 16 g 6  ?  Sig: Place 2 sprays into both nostrils daily.  ?  ? ?

## 2021-07-18 ENCOUNTER — Encounter: Payer: Self-pay | Admitting: Family

## 2021-07-19 ENCOUNTER — Other Ambulatory Visit: Payer: Self-pay | Admitting: Family

## 2021-07-19 DIAGNOSIS — E119 Type 2 diabetes mellitus without complications: Secondary | ICD-10-CM

## 2021-07-25 ENCOUNTER — Telehealth: Payer: Self-pay | Admitting: Family

## 2021-07-25 NOTE — Telephone Encounter (Signed)
Can he give [name] some readings for this week since he has been off Trulicity for a few weeks now? Is he feeling better?

## 2021-07-25 NOTE — Telephone Encounter (Signed)
-----   Message from Marrian Salvage, [place] sent at 07/11/2021  4:26 PM EDT ----- Call to get blood sugar readings off Trulicity

## 2021-07-26 ENCOUNTER — Other Ambulatory Visit: Payer: Self-pay

## 2021-07-26 DIAGNOSIS — E119 Type 2 diabetes mellitus without complications: Secondary | ICD-10-CM

## 2021-07-26 MED ORDER — ONETOUCH DELICA PLUS LANCING MISC: 11 refills | Status: DC

## 2021-07-26 NOTE — Telephone Encounter (Signed)
Spoke to [place]. He says his blood sugar readings have been ok. The highest has been 159, lowest has been 61. States he has been staying around 116 since coming off of the Trulicity. Pt states he's still having trouble with the freestyle Elenor Legato so he has been doing traditional finger sticks. Pt has upcoming appointment with the diabetes clinic and will ask them to take a look at it.  Pt also states he is going to the gym four days a week. Pt wants to get healthy for his grandson, pt seems to be very motivated to make positive changes.

## 2021-09-15 ENCOUNTER — Encounter: Payer: Self-pay | Admitting: Dietician

## 2021-09-15 ENCOUNTER — Encounter: Payer: BC Managed Care – PPO | Attending: Family | Admitting: Dietician

## 2021-09-15 DIAGNOSIS — E119 Type 2 diabetes mellitus without complications: Secondary | ICD-10-CM | POA: Diagnosis not present

## 2021-09-15 DIAGNOSIS — Z713 Dietary counseling and surveillance: Secondary | ICD-10-CM | POA: Insufficient documentation

## 2021-09-15 DIAGNOSIS — E1165 Type 2 diabetes mellitus with hyperglycemia: Secondary | ICD-10-CM

## 2021-09-15 NOTE — Progress Notes (Signed)
Medical Nutrition Therapy  Appointment Start time:  [phone]  Appointment End time:  58  Primary concerns today: diet to control diabetes without medications  Referral diagnosis: Type 2 Diabetes.  A1C 5.9% and having lows on Trulicity and hoping to manage on diet alone. Preferred learning style: no preference indicated Learning readiness: ready, change in progress  NUTRITION ASSESSMENT   Anthropometrics  72" 212 lbs 09/15/2021 208 lbs at home which is stable   Clinical Medical Hx: Type 2 Diabetes (2021), HLD Medications: Atorvastatin Labs: A1C 5.9% 07/11/2021 decreased from 6.8% 12/09/2020, high of 12.1% 09/27/2021 (post 3rd booster shot per patient).  Vitamin B-12 312 01/2019      Notable Signs/Symptoms: dizziness and hypoglycemia on Trulicity  Lifestyle & Dietary Hx Patient lives with his wife and step son who has cerebral palsy and schizophrenia.  He does the shopping and cooking.  He works as a Diplomatic Services operational officer.  His job is quite active - ladder climbing, etc.  7 am - 7 pm. Has a wood shop and enjoys working in this.  Estimated daily fluid intake: >64 oz Supplements: occasional MVI Sleep: wakes to urinate twice per night Stress / self-care: stress free Current average weekly physical activity: gym 3-4 times per week for 90 minutes (cardio and weights).  24-Hr Dietary Recall First Meal: plain cheerios, blueberries, occasional splenda, coffee with zero sugar creamer Snack: occasional NABS when at work or granola bar and protein shake on days that he goes to the gym Second Meal: sub from subway on grain bun OR leftovers Snack: granola bar or nabs Third Meal 6:30 or 7:  [place] steak, cauliflower rice or occasional rice and gravy, vegetables or salad Snack: ruffles queso chips, sugar free wafer cookies Beverages: water, protein smoothie when he goes to the gym, coffee with zero creamer, 1 beer occasional and rare wine or shot (once per month).   NUTRITION DIAGNOSIS  NB-1.1 Food  and nutrition-related knowledge deficit As related to balance of carbohydrate, protein, and fat.  As evidenced by diet hx and patient report.   NUTRITION INTERVENTION  Nutrition education (E-1) on the following topics:  Diabetes remission definition Control of diabetes without medications and when medications are required Insulin resistance and recommendations to improve - low saturated fat, exercise Vitamin b-12 status and recommendations for supplementation Simple meal recommendations to decrease the amount of processed and highly processed food consumed.   Benefits of continued exercise Benefits of a diet that is high in non-starchy vegetables Choose whole grains over processed most frequently Benefits of increased fiber  Handouts Provided Include  How to Thrive:  A Guide for Your Journey with Diabetes by the ADA Meal Plan Card Snack list Label reading Diabetes Resources Types of fat  Learning Style & Readiness for Change Teaching method utilized: Visual & Auditory  Demonstrated degree of understanding via: Teach Back  Barriers to learning/adherence to lifestyle change: none  After visit summary Begin Vitamin B-12 (sublingual or dissolvable) Consider having your vitamin B-12 rechecked Black bean burger Consider trying Ezekiel bread. Bake rather than fry most often.  Air frying is fine.  Consistent meals.  Avocado toast  Toast with natural peanut butter  Soup and or salad.  Top salad with rinsed beans or edemame.  Eat more Non-Starchy Vegetables These include greens, broccoli, cauliflower, cabbage, carrots, beets, eggplant, peppers, squash and others. Minimize added sugars and refined grains Rethink what you drink.  Choose beverages without added sugar.  Look for 0 carbs on the label. See the list  of whole grains below.  Find alternatives to usual sweet treats. Choose whole foods over processed. Make simple meals at home more often than eating out.  Tips to increase  fiber in your diet: (All plants have fiber.  Eat a variety. There are more than are on this list.) Slowly increase the amount of fiber you eat to 25-35 grams per day.  (More is fine if you tolerate it.) Fiber from whole grains, nuts and seeds Quinoa, 1/2 cup = 5 grams Bulgur, 1/2 cup = 4.1 grams Popcorn, 3 cups = 3.6 grams Whole Wheat Spaghetti, 1/2 cup = 3.2 grams Barley, 1/2 cup = 3 grams Oatmeal, 1/2 cup = 2 grams Whole Wheat English Muffin = 3 grams Corn, 1/2 cup = 2.1 grams Brown Rice, 1/2 cup = 1.8 grams Flax seeds, 1 Tablespoon = 2.8 grams Chia seeds, 1 Tablespoon = 11 grams Almonds, 1 ounce = 3.5 grams fiber Fiber from legumes Kidney beans, 1/2 cup 7.9 grams Lentils, 1/2 cup = 7.8 grams Pinto beans, 1/2 cup = 7.7 grams Black beans, 1/2 cup = 7.6 grams Lima beans, 1/2 cup 6.4 grams Chick peas, 1/2 cup = 5.3 grams Black eyed peas, 1/2 cup = 4 grams Fiber from fruits and vegetables Pear, 6 grams Apple. 3.3 grams Raspberries or Blackberries, 3/4 cup = 6 grams Strawberries or Blueberries, 1 cup = 3.4 grams Baked sweet potato 3.8 grams fiber Baked potato with skin 4.4 grams  Peas, 1/2 cup = 4.4 grams  Spinach, 1/2 cup cooked = 3.5 grams  Avocado, 1/2 = 5 grams   MONITORING & EVALUATION Dietary intake, weekly physical activity, and label reading prn.  Next Steps  Patient is to call for questions.

## 2021-09-15 NOTE — Patient Instructions (Addendum)
Begin Vitamin B-12 (sublingual or dissolvable) Consider having your vitamin B-12 rechecked Black bean burger Consider trying Ezekiel bread. Bake rather than fry most often.  Air frying is fine.  Consistent meals.  Avocado toast  Toast with natural peanut butter  Soup and or salad.  Top salad with rinsed beans or edemame.  Eat more Non-Starchy Vegetables These include greens, broccoli, cauliflower, cabbage, carrots, beets, eggplant, peppers, squash and others. Minimize added sugars and refined grains Rethink what you drink.  Choose beverages without added sugar.  Look for 0 carbs on the label. See the list of whole grains below.  Find alternatives to usual sweet treats. Choose whole foods over processed. Make simple meals at home more often than eating out.  Tips to increase fiber in your diet: (All plants have fiber.  Eat a variety. There are more than are on this list.) Slowly increase the amount of fiber you eat to 25-35 grams per day.  (More is fine if you tolerate it.) Fiber from whole grains, nuts and seeds Quinoa, 1/2 cup = 5 grams Bulgur, 1/2 cup = 4.1 grams Popcorn, 3 cups = 3.6 grams Whole Wheat Spaghetti, 1/2 cup = 3.2 grams Barley, 1/2 cup = 3 grams Oatmeal, 1/2 cup = 2 grams Whole Wheat English Muffin = 3 grams Corn, 1/2 cup = 2.1 grams Brown Rice, 1/2 cup = 1.8 grams Flax seeds, 1 Tablespoon = 2.8 grams Chia seeds, 1 Tablespoon = 11 grams Almonds, 1 ounce = 3.5 grams fiber Fiber from legumes Kidney beans, 1/2 cup 7.9 grams Lentils, 1/2 cup = 7.8 grams Pinto beans, 1/2 cup = 7.7 grams Black beans, 1/2 cup = 7.6 grams Lima beans, 1/2 cup 6.4 grams Chick peas, 1/2 cup = 5.3 grams Black eyed peas, 1/2 cup = 4 grams Fiber from fruits and vegetables Pear, 6 grams Apple. 3.3 grams Raspberries or Blackberries, 3/4 cup = 6 grams Strawberries or Blueberries, 1 cup = 3.4 grams Baked sweet potato 3.8 grams fiber Baked potato with skin 4.4 grams  Peas, 1/2 cup = 4.4  grams  Spinach, 1/2 cup cooked = 3.5 grams  Avocado, 1/2 = 5 grams

## 2021-09-19 ENCOUNTER — Ambulatory Visit: Payer: BC Managed Care – PPO | Admitting: Skilled Nursing Facility1

## 2021-09-25 ENCOUNTER — Encounter (HOSPITAL_BASED_OUTPATIENT_CLINIC_OR_DEPARTMENT_OTHER): Payer: Self-pay

## 2021-09-25 ENCOUNTER — Emergency Department (HOSPITAL_BASED_OUTPATIENT_CLINIC_OR_DEPARTMENT_OTHER): Payer: BC Managed Care – PPO

## 2021-09-25 ENCOUNTER — Emergency Department (HOSPITAL_BASED_OUTPATIENT_CLINIC_OR_DEPARTMENT_OTHER)
Admission: EM | Admit: 2021-09-25 | Discharge: 2021-09-25 | Disposition: A | Discharge status: Home / Self Care | Payer: BC Managed Care – PPO | Attending: Emergency Medicine | Admitting: Emergency Medicine

## 2021-09-25 ENCOUNTER — Other Ambulatory Visit: Payer: Self-pay

## 2021-09-25 DIAGNOSIS — Z794 Long term (current) use of insulin: Secondary | ICD-10-CM | POA: Insufficient documentation

## 2021-09-25 DIAGNOSIS — E119 Type 2 diabetes mellitus without complications: Secondary | ICD-10-CM | POA: Insufficient documentation

## 2021-09-25 DIAGNOSIS — M25551 Pain in right hip: Secondary | ICD-10-CM | POA: Diagnosis not present

## 2021-09-25 DIAGNOSIS — Z87891 Personal history of nicotine dependence: Secondary | ICD-10-CM | POA: Diagnosis not present

## 2021-09-25 LAB — CBC
HCT: 49.8 % (ref 39.0–52.0)
Hemoglobin: 16 g/dL (ref 13.0–17.0)
MCH: 27.7 pg (ref 26.0–34.0)
MCHC: 32.1 g/dL (ref 30.0–36.0)
MCV: 86.2 fL (ref 80.0–100.0)
Platelets: 222 10*3/uL (ref 150–400)
RBC: 5.78 MIL/uL (ref 4.22–5.81)
RDW: 13.5 % (ref 11.5–15.5)
WBC: 7.2 10*3/uL (ref 4.0–10.5)
nRBC: 0 % (ref 0.0–0.2)

## 2021-09-25 LAB — BASIC METABOLIC PANEL
Anion gap: 8 (ref 5–15)
BUN: 24 mg/dL — ABNORMAL HIGH (ref 6–20)
CO2: 26 mmol/L (ref 22–32)
Calcium: 9.6 mg/dL (ref 8.9–10.3)
Chloride: 102 mmol/L (ref 98–111)
Creatinine, Ser: 0.97 mg/dL (ref 0.61–1.24)
GFR, Estimated: >60 mL/min (ref >60)
Glucose, Bld: 110 mg/dL — ABNORMAL HIGH (ref 70–99)
Potassium: 4.4 mmol/L (ref 3.5–5.1)
Sodium: 136 mmol/L (ref 135–145)

## 2021-09-25 MED ORDER — LIDOCAINE 5 % EX PTCH
1.0000 | MEDICATED_PATCH | CUTANEOUS | Status: DC
  Administered 2021-09-25: 1 via TRANSDERMAL
  Filled 2021-09-25: qty 1

## 2021-09-25 MED ORDER — HYDROCODONE-ACETAMINOPHEN 5-325 MG PO TABS
1.0000 | ORAL_TABLET | Freq: Once | ORAL | Status: AC
  Administered 2021-09-25: 1 via ORAL
  Filled 2021-09-25: qty 1

## 2021-09-25 MED ORDER — FENTANYL CITRATE PF 50 MCG/ML IJ SOSY
50.0000 ug | PREFILLED_SYRINGE | Freq: Once | INTRAMUSCULAR | Status: AC
  Administered 2021-09-25: 50 ug via INTRAVENOUS
  Filled 2021-09-25: qty 1

## 2021-09-25 MED ORDER — LIDOCAINE 5 % EX PTCH: 1.0000 | MEDICATED_PATCH | CUTANEOUS | 0 refills | Status: DC

## 2021-09-25 MED ORDER — IBUPROFEN 600 MG PO TABS: 600.0000 mg | ORAL_TABLET | Freq: Four times a day (QID) | ORAL | 0 refills | Status: DC | PRN

## 2021-09-25 MED ORDER — OXYCODONE HCL 5 MG PO TABS: 5.0000 mg | ORAL_TABLET | Freq: Four times a day (QID) | ORAL | 0 refills | Status: DC | PRN

## 2021-09-25 NOTE — ED Provider Notes (Signed)
[place] DEPT Provider Note   CSN: [id number] Arrival date & time: 09/25/21  3329     History  Chief Complaint  Patient presents with   Hip Pain    Austin Meadows is a 61 y.o. male.   Hip Pain  61 year old male presents emergency department with complaints of right-sided hip pain.  He states that symptoms began when he was walking on his treadmill on Saturday.  He denies direct fall or trauma to affected hip.  He states that pain began gradually until worsened Saturday night.  He has been able to walk but with exacerbating pain.  Pain is described on the right lateral aspect of patient's upper leg with no radiation..  Denies sensory deficits/weakness in affected extremity, fever, bowel/bladder dysfunction, history of IV drug use, saddle anesthesia.  He states the pain has been severe since Saturday night.  He has tried at home ibuprofen as well as ice over the affected area with minimal to no relief prompting his visit to the emergency department.  Denies history of surgery to affected hip.  Denies trauma since initial event.  Denies any overlying skin abnormalities.  Home Medications Prior to Admission medications   Medication Sig Start Date End Date Taking? Authorizing Provider  atorvastatin (LIPITOR) 20 MG tablet Take 1 tablet by mouth once daily 05/16/21   Marrian Salvage, FNP  blood glucose meter kit and supplies KIT Dispense based on patient and insurance preference. Use up to four times daily as directed. (FOR ICD-9 250.00, 250.01). 12/17/18   Marrian Salvage, FNP  Continuous Blood Gluc Receiver (FREESTYLE LIBRE 2 READER) DEVI Use as directed to check blood sugar Patient not taking: Reported on 09/15/2021 07/07/21   Marrian Salvage, FNP  Continuous Blood Gluc Sensor (FREESTYLE LIBRE 2 SENSOR) MISC Use as directed to check blood sugar Patient not taking: Reported on 09/15/2021 07/07/21   Marrian Salvage, FNP  Dulaglutide  (TRULICITY) 5.18 AC/1.6SA SOPN Inject 0.75 mg into the skin once a week. Patient not taking: Reported on 09/15/2021 05/17/21   Marrian Salvage, FNP  fluticasone [facility]) 50 MCG/ACT nasal spray Place 2 sprays into both nostrils daily. Patient not taking: Reported on 09/15/2021 07/11/21   Marrian Salvage, FNP  Insulin Pen Needle 31G X 5 MM MISC As directed 10/06/20   Marrian Salvage, FNP  Lancet Devices [facility] PLUS LANCING) MISC Use to check glucose up to 4 times daily as directed. 07/26/21   Marrian Salvage, FNP  Lancets (ONETOUCH DELICA PLUS [id number]) [place] USE   TO CHECK GLUCOSE   UP TO 4 TIMES DAILY AS DIRECTED 11/28/20   Marrian Salvage, FNP  omeprazole (PRILOSEC) 40 MG capsule Take 1 capsule by mouth once daily 02/23/21   Marrian Salvage, FNP  [facility] ULTRA test strip USE TO CHECK GLUCOSE UP TO 3 TIMES DAILY AS DIRECTED 11/01/20   Marrian Salvage, [facility] rectal foam PLACE 1 APPLICATORFUL RECTALLY  THREE TIMES DAILY 01/11/20   Marrian Salvage, FNP      Allergies    Influenza vaccines, Metformin and related, and Penicillins    Review of Systems   Review of Systems  Musculoskeletal:        Right hip pain  All other systems reviewed and are negative.   Physical Exam Updated Vital Signs BP 136/73 (BP Location: Right Arm)   Pulse 76   Temp 97.9 F (36.6 C)   Resp 18   Ht  6' (1.829 m)   Wt 94.8 kg   SpO2 98%   BMI 28.35 kg/m  Physical Exam Vitals and nursing note reviewed.  Constitutional:      General: He is not in acute distress.    Appearance: He is well-developed.  HENT:     Head: Normocephalic and atraumatic.  Eyes:     Conjunctiva/sclera: Conjunctivae normal.  Cardiovascular:     Rate and Rhythm: Normal rate and regular rhythm.     Heart sounds: No murmur heard. Pulmonary:     Effort: Pulmonary effort is normal. No respiratory distress.     Breath sounds: Normal breath sounds.  Abdominal:      Palpations: Abdomen is soft.     Tenderness: There is no abdominal tenderness.  Musculoskeletal:        General: No swelling.     Cervical back: Neck supple.     Right lower leg: No edema.     Left lower leg: No edema.       Legs:     Comments: Patient has tender to palpation of right lateral leg.  Area as indicated in diagram above.  There is no overlying skin abnormalities or evidence of prior surgical incisions.  Pain is worsened with abduction and is relieved with rest.  Posterior tibial pulses are full and intact bilaterally.  No sensory deficits along major nerve distributions lower extremities.  DTR symmetric and equal bilaterally.  Strength 5 out of 5.  No tenderness to palpation of midline lumbar spine or lumbar paraspinal muscles.  Skin:    General: Skin is warm and dry.     Capillary Refill: Capillary refill takes less than 2 seconds.  Neurological:     Mental Status: He is alert.  Psychiatric:        Mood and Affect: Mood normal.     ED Results / Procedures / Treatments   Labs (all labs ordered are listed, but only abnormal results are displayed) Labs Reviewed  BASIC METABOLIC PANEL - Abnormal; Notable for the following components:      Result Value   Glucose, Bld 110 (*)    BUN 24 (*)    All other components within normal limits  CBC    EKG None  Radiology DG Hip Unilat W or Wo Pelvis 2-3 Views Right  Result Date: 09/25/2021 CLINICAL DATA:  Lateral right hip pain.  No injury EXAM: DG HIP (WITH OR WITHOUT PELVIS) 2-3V RIGHT COMPARISON:  06/02/2019 FINDINGS: There is no evidence of hip fracture or dislocation. There is no evidence of significant arthropathy or other focal bone abnormality. IMPRESSION: Negative. Electronically Signed   By: Davina Poke D.O.   On: 09/25/2021 10:16    Procedures Procedures    Medications Ordered in ED Medications  lidocaine (LIDODERM) 5 % 1 patch (1 patch Transdermal Patch Applied 09/25/21 1340)  fentaNYL (SUBLIMAZE)  injection 50 mcg (has no administration in time range)  HYDROcodone-acetaminophen (NORCO/VICODIN) 5-325 MG per tablet 1 tablet (1 tablet Oral Given 09/25/21 1220)    ED Course/ Medical Decision Making/ A&P                           Medical Decision Making Amount and/or Complexity of Data Reviewed Labs: ordered. Radiology: ordered.  Risk Prescription drug management.   This patient presents to the ED for concern of right hip pain, this involves an extensive number of treatment options, and is a complaint that carries with it  a high risk of complications and morbidity.  The differential diagnosis includes osteoarthritis, septic arthritis, gout, muscular strain, contusion, muscular spasm, rheumatoid arthritis   Co morbidities that complicate the patient evaluation  Diabetes mellitus, hyperlipidemia   Additional history obtained:  Additional history obtained from hip x-ray from 09/25/2021 External records from outside source obtained and reviewed including no acute abnormalities on x-ray ordered   Lab Tests:  N/a given laboratory studies ordered earlier today showed no acute abnormality   Imaging Studies ordered:  I ordered imaging studies including MRI of right hip ordered but patient elected to have imaging done outpatient by primary care provider.  He was deemed to have capacity I agree with the radiologist interpretation   Cardiac Monitoring: / EKG:  The patient was maintained on a cardiac monitor.  I personally viewed and interpreted the cardiac monitored which showed an underlying rhythm of: Sinus rhythm   Consultations Obtained:  N/a   Problem List / ED Course / Critical interventions / Medication management  Right hip pain Reevaluation of the patient showed that the patient improved I have reviewed the patients home medicines and have made adjustments as needed   Social Determinants of Health:  Denies history of IV drug use.  Former cigarette smoker who  ceased in 1996   Test / Admission - Considered:  Right hip pain Vitals signs within normal range and stable throughout visit. Laboratory/imaging studies significant for: No acute abnormality.  MRI ordered for evaluation of patient's right sided hip pain.  Patient elected to have imaging ordered by his PCP outpatient.  Low suspicion for septic arthritis given lack of red flag signs for med HPI as well as reassuring physical exam.  Doubt cauda equina.  Patient's pain is more distal in location than is common for trochanteric bursitis but still probable.  Exact cause of patient's right sided hip pain still undetermined at this time.  We will treat outpatient with nonsteroidals, topical anesthetics with close follow-up with PCP regarding symptoms. Worrisome signs and symptoms were discussed with the patient, and the patient acknowledged understanding to return to the ED if noticed. Patient was stable upon discharge.         Final Clinical Impression(s) / ED Diagnoses Final diagnoses:  Right hip pain    Rx / DC Orders ED Discharge Orders     None         Wilnette Kales, [place] 09/25/21 1646    Milton Ferguson, MD 09/28/21 1754

## 2021-09-25 NOTE — ED Provider Notes (Signed)
[place] EMERGENCY DEPARTMENT Provider Note   CSN: [id number] Arrival date & time: 09/25/21  4098     History  Chief Complaint  Patient presents with   Hip Pain    Austin Meadows is a 61 y.o. male with medical history significant for diabetes, hyperlipidemia, hemorrhoids, colonoscopy.  Patient presents to the ED for evaluation of right hip pain.  Patient reports that on Saturday he was at the gym doing his typical workout routine on the eliptical and also the treadmill.  The patient states that while he was on the treadmill he felt sudden onset of pain in his right hip causing him to cease exercise.  The patient states that since this time he had progressively worsening right-sided hip pain.  Patient reports that he was able to control the hip pain on Sunday morning utilizing Motrin.  Patient states that his pain was controlled for about 4 hours before pain returned.  Patient states that he took subsequent dose of Motrin at this time however it did not alleviate his pain like it did initially.  Patient denies any preceding trauma to account for the pain.  Patient denies any fevers, nausea or vomiting, diarrhea, numbness or tingling into his right foot.  Patient denies an overlying skin change.   Hip Pain       Home Medications Prior to Admission medications   Medication Sig Start Date End Date Taking? Authorizing Provider  atorvastatin (LIPITOR) 20 MG tablet Take 1 tablet by mouth once daily 05/16/21   Marrian Salvage, FNP  blood glucose meter kit and supplies KIT Dispense based on patient and insurance preference. Use up to four times daily as directed. (FOR ICD-9 250.00, 250.01). 12/17/18   Marrian Salvage, FNP  Continuous Blood Gluc Receiver (FREESTYLE LIBRE 2 READER) DEVI Use as directed to check blood sugar Patient not taking: Reported on 09/15/2021 07/07/21   Marrian Salvage, FNP  Continuous Blood Gluc Sensor (FREESTYLE LIBRE 2 SENSOR) MISC Use as  directed to check blood sugar Patient not taking: Reported on 09/15/2021 07/07/21   Marrian Salvage, FNP  Dulaglutide (TRULICITY) 1.19 JY/7.8GN SOPN Inject 0.75 mg into the skin once a week. Patient not taking: Reported on 09/15/2021 05/17/21   Marrian Salvage, FNP  fluticasone [facility]) 50 MCG/ACT nasal spray Place 2 sprays into both nostrils daily. Patient not taking: Reported on 09/15/2021 07/11/21   Marrian Salvage, FNP  Insulin Pen Needle 31G X 5 MM MISC As directed 10/06/20   Marrian Salvage, FNP  Lancet Devices [facility] PLUS LANCING) MISC Use to check glucose up to 4 times daily as directed. 07/26/21   Marrian Salvage, FNP  Lancets (ONETOUCH DELICA PLUS [id number]) [place] USE   TO CHECK GLUCOSE   UP TO 4 TIMES DAILY AS DIRECTED 11/28/20   Marrian Salvage, FNP  omeprazole (PRILOSEC) 40 MG capsule Take 1 capsule by mouth once daily 02/23/21   Marrian Salvage, FNP  [facility] ULTRA test strip USE TO CHECK GLUCOSE UP TO 3 TIMES DAILY AS DIRECTED 11/01/20   Marrian Salvage, [place] [facility] rectal foam PLACE 1 APPLICATORFUL RECTALLY  THREE TIMES DAILY 01/11/20   Marrian Salvage, FNP      Allergies    Influenza vaccines, Metformin and related, and Penicillins    Review of Systems   Review of Systems  Constitutional:  Negative for chills and fever.  Gastrointestinal:  Negative for diarrhea, nausea and vomiting.  Musculoskeletal:  Positive  for arthralgias and myalgias.  Neurological:  Negative for numbness.  All other systems reviewed and are negative.   Physical Exam Updated Vital Signs BP (!) 149/92 (BP Location: Left Arm)   Pulse 81   Temp 98 F (36.7 C) (Oral)   Resp 18   Ht 6' (1.829 m)   Wt 94.8 kg   SpO2 98%   BMI 28.35 kg/m  Physical Exam Vitals and nursing note reviewed.  Constitutional:      General: He is not in acute distress.    Appearance: Normal appearance. He is not ill-appearing, toxic-appearing or  diaphoretic.  HENT:     Head: Normocephalic and atraumatic.     Nose: Nose normal. No congestion.     Mouth/Throat:     Mouth: Mucous membranes are moist.     Pharynx: Oropharynx is clear.  Eyes:     Extraocular Movements: Extraocular movements intact.     Conjunctiva/sclera: Conjunctivae normal.     Pupils: Pupils are equal, round, and reactive to light.  Cardiovascular:     Rate and Rhythm: Normal rate and regular rhythm.  Pulmonary:     Effort: Pulmonary effort is normal.     Breath sounds: Normal breath sounds. No wheezing.  Abdominal:     General: Abdomen is flat. Bowel sounds are normal.     Palpations: Abdomen is soft.     Tenderness: There is no abdominal tenderness.  Musculoskeletal:     Cervical back: Normal range of motion and neck supple. No tenderness.     Right hip: Tenderness present. No deformity or lacerations. Decreased range of motion.     Left hip: Normal.     Comments: Patient with decreased range of motion to the right hip secondary to pain.  There is no crepitus, deformity noted.  There is no overlying skin change.  Skin:    General: Skin is warm and dry.     Capillary Refill: Capillary refill takes less than 2 seconds.  Neurological:     Mental Status: He is alert and oriented to person, place, and time.     ED Results / Procedures / Treatments   Labs (all labs ordered are listed, but only abnormal results are displayed) Labs Reviewed  CBC  BASIC METABOLIC PANEL    EKG None  Radiology DG Hip Unilat W or Wo Pelvis 2-3 Views Right  Result Date: 09/25/2021 CLINICAL DATA:  Lateral right hip pain.  No injury EXAM: DG HIP (WITH OR WITHOUT PELVIS) 2-3V RIGHT COMPARISON:  06/02/2019 FINDINGS: There is no evidence of hip fracture or dislocation. There is no evidence of significant arthropathy or other focal bone abnormality. IMPRESSION: Negative. Electronically Signed   By: Davina Poke D.O.   On: 09/25/2021 10:16    Procedures Procedures    Medications Ordered in ED Medications  HYDROcodone-acetaminophen (NORCO/VICODIN) 5-325 MG per tablet 1 tablet (1 tablet Oral Given 09/25/21 1220)    ED Course/ Medical Decision Making/ A&P                           Medical Decision Making Amount and/or Complexity of Data Reviewed Labs: ordered. Radiology: ordered.  Risk Prescription drug management.   61 year old male presents to the ED for evaluation of right hip pain.  Please see HPI for further details.  On examination the patient is afebrile and nontachycardic.  The patient lung sounds are clear bilaterally, he is not hypoxic.  The patient abdomen is soft  and compressible all 4 quadrants.  Patient right hip was examined without findings of overlying skin change, crepitus or deformity.  The patient does have decreased range of motion secondary to pain.  Patient has decreased extension, flexion, abduction and adduction.  Plain film imaging of patient hip does not show any deformity, fracture or dislocation.  Patient provided with 5 mg hydrocodone for pain, basic labs were collected to include CBC, BMP.  At this time, patient presentation is concerning for septic arthritis.  MRI machine is not currently available at [facility] so the patient will be sent from ED to ED.  Patient will be sent to [facility] long ED and Dr. Roderic Palau has agreed to accept the patient.  The plan has been explained to the patient and the patient is amenable to this plan.  The patient has been advised to have his wife drive him POV from [employer] to [place] long ED for MRI imaging.  The patient voices understanding and agreement with this plan.  The patient is stable and will be discharged in stable condition and advised to proceed to [facility] long ED for further imaging.  Patient stable.  Final Clinical Impression(s) / ED Diagnoses Final diagnoses:  Right hip pain    Rx / DC Orders ED Discharge Orders     None         Lawana Chambers 09/25/21 1240    Regan Lemming, MD 09/25/21 2003

## 2021-09-25 NOTE — Discharge Instructions (Addendum)
Follow-up with Dr. Clearance Coots for reevaluation of your right-sided hip pain.  Although you declined MRI today, you are always welcome back to the emergency department for further evaluation of your symptoms.  In the meantime, we will prescribe Lidoderm patches, pain medication, ibuprofen to take as needed for pain.  Follow-up with your primary care provider as soon as possible for reevaluation.  Please not hesitate to return to emergency department for worrisome signs symptoms we discussed, parent.

## 2021-09-25 NOTE — ED Notes (Signed)
Instructions provided regards to going to [facility] for MRI studies

## 2021-09-25 NOTE — ED Triage Notes (Signed)
C/o right hip pain x several days. Denies known injury.

## 2021-09-25 NOTE — ED Notes (Signed)
Pt was informed about the significant wait for MRI and he asked he could schedule it as an OP procedure. This medic asked him to at least wait to see a provider and he agreed to this and he then requested something for pain.

## 2021-09-25 NOTE — ED Notes (Signed)
Was on the treadmill a few days ago, began having rt hip pain. Hx of Bursitis. Has strong plantar and dorsal flexion of Rt foot. Capillary refill rt foot WNL, warm to touch, Rt pedal and rt post tib pulses easily palpable

## 2021-09-26 ENCOUNTER — Emergency Department (HOSPITAL_COMMUNITY): Payer: BC Managed Care – PPO

## 2021-09-26 ENCOUNTER — Other Ambulatory Visit: Payer: Self-pay

## 2021-09-26 ENCOUNTER — Ambulatory Visit: Payer: BC Managed Care – PPO | Admitting: Family

## 2021-09-26 ENCOUNTER — Other Ambulatory Visit: Payer: Self-pay | Admitting: Family

## 2021-09-26 ENCOUNTER — Telehealth: Payer: Self-pay | Admitting: Family

## 2021-09-26 ENCOUNTER — Emergency Department (HOSPITAL_COMMUNITY)
Admission: EM | Admit: 2021-09-26 | Discharge: 2021-09-26 | Disposition: A | Discharge status: Home / Self Care | Payer: BC Managed Care – PPO | Attending: Emergency Medicine | Admitting: Emergency Medicine

## 2021-09-26 ENCOUNTER — Encounter (HOSPITAL_COMMUNITY): Payer: Self-pay

## 2021-09-26 VITALS — BP 138/78 | HR 80 | Temp 97.8°F | Resp 16 | Ht 72.0 in | Wt 212.0 lb

## 2021-09-26 DIAGNOSIS — Y9301 Activity, walking, marching and hiking: Secondary | ICD-10-CM | POA: Diagnosis not present

## 2021-09-26 DIAGNOSIS — M24151 Other articular cartilage disorders, right hip: Secondary | ICD-10-CM | POA: Diagnosis not present

## 2021-09-26 DIAGNOSIS — Z794 Long term (current) use of insulin: Secondary | ICD-10-CM | POA: Insufficient documentation

## 2021-09-26 DIAGNOSIS — M7631 Iliotibial band syndrome, right leg: Secondary | ICD-10-CM | POA: Diagnosis not present

## 2021-09-26 DIAGNOSIS — E119 Type 2 diabetes mellitus without complications: Secondary | ICD-10-CM

## 2021-09-26 DIAGNOSIS — M25551 Pain in right hip: Secondary | ICD-10-CM | POA: Diagnosis not present

## 2021-09-26 DIAGNOSIS — R600 Localized edema: Secondary | ICD-10-CM | POA: Diagnosis not present

## 2021-09-26 LAB — COMPREHENSIVE METABOLIC PANEL
ALT: 23 U/L (ref 0–44)
AST: 17 U/L (ref 15–41)
Albumin: 4 g/dL (ref 3.5–5.0)
Alkaline Phosphatase: 47 U/L (ref 38–126)
Anion gap: 7 (ref 5–15)
BUN: 19 mg/dL (ref 6–20)
CO2: 24 mmol/L (ref 22–32)
Calcium: 8.8 mg/dL — ABNORMAL LOW (ref 8.9–10.3)
Chloride: 105 mmol/L (ref 98–111)
Creatinine, Ser: 0.88 mg/dL (ref 0.61–1.24)
GFR, Estimated: >60 mL/min (ref >60)
Glucose, Bld: 204 mg/dL — ABNORMAL HIGH (ref 70–99)
Potassium: 3.9 mmol/L (ref 3.5–5.1)
Sodium: 136 mmol/L (ref 135–145)
Total Bilirubin: 0.6 mg/dL (ref 0.3–1.2)
Total Protein: 7 g/dL (ref 6.5–8.1)

## 2021-09-26 LAB — CBC WITH DIFFERENTIAL/PLATELET
Abs Immature Granulocytes: 0.02 10*3/uL (ref 0.00–0.07)
Basophils Absolute: 0 10*3/uL (ref 0.0–0.1)
Basophils Relative: 1 %
Eosinophils Absolute: 0.2 10*3/uL (ref 0.0–0.5)
Eosinophils Relative: 2 %
HCT: 46 % (ref 39.0–52.0)
Hemoglobin: 14.8 g/dL (ref 13.0–17.0)
Immature Granulocytes: 0 %
Lymphocytes Relative: 20 %
Lymphs Abs: 1.6 10*3/uL (ref 0.7–4.0)
MCH: 28 pg (ref 26.0–34.0)
MCHC: 32.2 g/dL (ref 30.0–36.0)
MCV: 87 fL (ref 80.0–100.0)
Monocytes Absolute: 0.9 10*3/uL (ref 0.1–1.0)
Monocytes Relative: 11 %
Neutro Abs: 5.2 10*3/uL (ref 1.7–7.7)
Neutrophils Relative %: 66 %
Platelets: 190 10*3/uL (ref 150–400)
RBC: 5.29 MIL/uL (ref 4.22–5.81)
RDW: 13.4 % (ref 11.5–15.5)
WBC: 7.9 10*3/uL (ref 4.0–10.5)
nRBC: 0 % (ref 0.0–0.2)

## 2021-09-26 MED ORDER — LIDOCAINE 5 % EX PTCH: 1.0000 | MEDICATED_PATCH | CUTANEOUS | 0 refills | Status: AC

## 2021-09-26 MED ORDER — IBUPROFEN 600 MG PO TABS
600.0000 mg | ORAL_TABLET | Freq: Four times a day (QID) | ORAL | 0 refills | Status: DC | PRN
Start: 2021-09-26 — End: 2021-10-19

## 2021-09-26 MED ORDER — OXYCODONE HCL 5 MG PO TABS: 5.0000 mg | ORAL_TABLET | Freq: Four times a day (QID) | ORAL | 0 refills | Status: DC | PRN

## 2021-09-26 MED ORDER — GADOBUTROL 1 MMOL/ML IV SOLN
9.5000 mL | Freq: Once | INTRAVENOUS | Status: AC | PRN
  Administered 2021-09-26: 9.5 mL via INTRAVENOUS

## 2021-09-26 NOTE — Telephone Encounter (Signed)
Patient is not seeing his orthopedic doctor tomorrow and needs a refill on his pain meds.   Medication: HYDROcodone-acetaminophen, ibuprofen (ADVIL) 600 MG tablet , and lidocaine (LIDODERM) 5 % [[id number]]    Has the patient contacted their pharmacy? No.   Preferred Pharmacy (with phone number or street name): [facility] ([address]), [place]) [place] [address]  Phone:  [phone]  Fax:  [phone]  Agent: Please be advised that RX refills may take up to 3 business days. We ask that you follow-up with your pharmacy.

## 2021-09-26 NOTE — Discharge Instructions (Addendum)
Your MRI was overall positive for inflammation of your greater trochanteric bursa as well as right-sided IT band.  I provided information regarding stretching exercises for said syndrome.  You can take at home medication I prescribed by your prior discharge as well as nonsteroidals such as ibuprofen at home.  You can continue take ibuprofen acid to your pharmacy yesterday..  There is no sign of septic arthritis of infected hip of which the provider at [place] was concerned for.  A provide an orthopedic to follow-up with in 5 to 7 days for reevaluation of your symptoms.  Please call the number in your discharge papers to set up an appointment.  Please not hesitate to return to the emergency department if the worrisome signs and symptoms we discussed become apparent.

## 2021-09-26 NOTE — Progress Notes (Signed)
Austin Meadows is a 61 y.o. 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 16 g 6   ibuprofen (ADVIL) 600 MG tablet Take 1 tablet (600 mg total) by mouth every 6 (six) hours as needed. 30 tablet 0   lidocaine (LIDODERM) 5 % Place 1 patch onto the skin daily. Remove & Discard patch within 12 hours or as directed by MD 30 patch 0   omeprazole (PRILOSEC) 40 MG capsule Take 1 capsule by mouth once daily 90 capsule 0   oxyCODONE (ROXICODONE) 5 MG immediate release tablet Take 1 tablet (5 mg total) by mouth every 6 (six) hours as needed for severe pain. 12 tablet 0   PROCTOFOAM HC rectal foam PLACE 1 APPLICATORFUL RECTALLY  THREE TIMES DAILY 10 g 0   No current facility-administered medications for this visit.    Allergies: Influenza vaccines, Metformin and related, and Penicillins  Past Medical History:  Diagnosis Date   Chicken pox    Diabetes mellitus without complication (HCC)    type 2   Hemorrhoid    Hyperlipidemia    Jaundice of newborn    Rheumatic fever    UTI (lower urinary tract infection)     Past Surgical History:  Procedure Laterality Date   COLONOSCOPY  ages [age]   done at [facility] and [facility](pt unable to get records)    Family  History  Problem Relation Age of Onset   Arthritis Mother    Cancer Mother        bone cancer   Colon cancer Father 59   Prostate cancer Father    Heart disease Father    Hypertension Father    Diabetes Father    Throat cancer Father    Colon cancer Brother 15       late 3's when dx   Cancer Brother        another cancer METS to brain   Colon cancer Brother 51   Colon cancer Other 66       Bill's daughter   Healthy Son        x 4   Healthy Daughter        x 1   Rectal cancer Neg Hx    Stomach cancer Neg Hx    Esophageal cancer Neg Hx     Social History   Tobacco Use   Smoking status: Former    Types: Cigarettes    Quit date: 06/04/1994    Years since quitting: 27.3   Smokeless tobacco: Never  Substance Use Topics   Alcohol use: Not Currently    Alcohol/week: 0.0 standard drinks of alcohol    Comment: Occasionally (one drink per week).  Heavier drinking in the [employer] for ~ 8 years.     Subjective:   Patient has been seen at the  ER twice in the past 24 hours with severe right hip pain; he did have an MRI this morning and was recommended to follow up with orthopedics for further evaluation. Is scheduled to see sports medicine next Monday but is concerned about pain level/ waiting that long for appointment;  Also requesting to get his Hgba1c checked today; no currently taking any medication; fasting blood sugar averaging 105-115;     Objective:  Vitals:   09/26/21 1342  BP: 138/78  Pulse: 80  Resp: 16  Temp: 97.8 F (36.6 C)  TempSrc: Oral  SpO2: 97%  Weight: 212 lb (96.2 kg)  Height: 6' (1.829 m)    General: Well developed, well nourished, in no acute distress  Skin : Warm and dry.  Head: Normocephalic and atraumatic  Lungs: Respirations unlabored;  Extremities: No edema, cyanosis, clubbing  Vessels: Symmetric bilaterally  Neurologic: Alert and oriented; speech intact; face symmetrical; favors right hip while walking  Assessment:  1. Right hip pain    2. Type 2 diabetes mellitus without complication, without long-term current use of insulin ([place])     Plan:  MRI was updated at ER this morning; STAT referral to orthopedics;  Update Hgba1c today;   No follow-ups on file.  Orders Placed This Encounter  Procedures   Hemoglobin A1c   Ambulatory referral to Orthopedic Surgery    Referral Priority:   Emergency    Referral Type:   Surgical    Referral Reason:   Specialty Services Required    Requested Specialty:   Orthopedic Surgery    Number of Visits Requested:   1    Requested Prescriptions    No prescriptions requested or ordered in this encounter

## 2021-09-26 NOTE — ED Notes (Addendum)
Pt states understanding of dc instructions, importance of follow up, and work note. Pt denies questions or concerns upon dc. Pt assisted into wheelchair and wheeled out of ed. No belongings left in room upon dc.

## 2021-09-26 NOTE — ED Triage Notes (Signed)
Pt reports with right hip pain since Saturday and states that he need an MRI done.

## 2021-09-26 NOTE — ED Provider Notes (Signed)
[facility] DEPT Provider Note   CSN: [id number] Arrival date & time: 09/26/21  0645     History  Chief Complaint  Patient presents with   MRI    Austin Meadows is a 61 y.o. male.  HPI   61 year old male presents emergency department with complaints of right-sided hip pain.  He states that symptoms began when he was walking on his treadmill on Saturday.  He denies direct fall or trauma to affected hip.  He states that pain began gradually until worsened Saturday night.  He has been able to walk but walking exacerbated pain.  Pain is described on the right lateral aspect of patient's upper leg with no radiation..  Denies sensory deficits/weakness in affected extremity, fever, bowel/bladder dysfunction, history of IV drug use, saddle anesthesia.  He states the pain has been severe since Saturday night.  He has tried at home ibuprofen as well as ice over the affected area with minimal to no relief prompting his visit to the emergency department yesterday.  He was seen but opted to get an MRI at a later date through his PCP.  He revisits emergency department for an MRI and experiencing no new symptoms today and no new falls/traumas; this is the pain medication prescribed has been helping significantly.  Provider who ordered the MRI was seen at [facility] with concern for septic arthritis upon patient's presentation. Denies history of surgery to affected hip.  Denies trauma since initial event.  Denies any overlying skin abnormalities.   Home Medications Prior to Admission medications   Medication Sig Start Date End Date Taking? Authorizing Provider  atorvastatin (LIPITOR) 20 MG tablet Take 1 tablet by mouth once daily 05/16/21   Marrian Salvage, FNP  Dulaglutide (TRULICITY) 2.95 JO/8.4ZY SOPN Inject 0.75 mg into the skin once a week. 05/17/21   Marrian Salvage, FNP  fluticasone (FLONASE) 50 MCG/ACT nasal spray Place 2 sprays into both nostrils  daily. Patient not taking: Reported on 09/15/2021 07/11/21   Marrian Salvage, FNP  ibuprofen (ADVIL) 600 MG tablet Take 1 tablet (600 mg total) by mouth every 6 (six) hours as needed. 09/25/21   Dion Saucier A, PA  lidocaine (LIDODERM) 5 % Place 1 patch onto the skin daily. Remove & Discard patch within 12 hours or as directed by MD 09/25/21   Wilnette Kales, PA  omeprazole (PRILOSEC) 40 MG capsule Take 1 capsule by mouth once daily 02/23/21   Marrian Salvage, FNP  oxyCODONE (ROXICODONE) 5 MG immediate release tablet Take 1 tablet (5 mg total) by mouth every 6 (six) hours as needed for severe pain. 09/25/21   Wilnette Kales, PA  PROCTOFOAM [facility] rectal foam PLACE 1 APPLICATORFUL RECTALLY  THREE TIMES DAILY 01/11/20   Marrian Salvage, FNP      Allergies    Influenza vaccines, Metformin and related, and Penicillins    Review of Systems   Review of Systems  Musculoskeletal:        Right hip pain    Physical Exam Updated Vital Signs BP 123/70   Pulse 71   Temp 97.8 F (36.6 C) (Oral)   Resp 16   Ht 6' (1.829 m)   Wt 94.8 kg   SpO2 99%   BMI 28.35 kg/m  Physical Exam Vitals and nursing note reviewed.  Constitutional:      General: He is not in acute distress.    Appearance: He is well-developed.  HENT:     Head:  Normocephalic and atraumatic.  Eyes:     Conjunctiva/sclera: Conjunctivae normal.  Cardiovascular:     Rate and Rhythm: Normal rate and regular rhythm.     Heart sounds: No murmur heard. Pulmonary:     Effort: Pulmonary effort is normal. No respiratory distress.     Breath sounds: Normal breath sounds.  Abdominal:     Palpations: Abdomen is soft.     Tenderness: There is no abdominal tenderness.  Musculoskeletal:        General: No swelling.     Cervical back: Neck supple.     Right lower leg: No edema.     Left lower leg: No edema.       Legs:     Comments: Patient has tenderness to palpation over area indicated above.  There is a lateral  aspect of patient's proximal aspect of patient's right lower extremity.  No overlying skin abnormality noted.  Patient not complaining of sensory deficits distal to injury.  Muscle strength out of 5 bilaterally.  Posterior tibial pulses full and intact bilaterally.  Patient is full active range of motion of hips, knees, ankles bilaterally.  Hip abduction elicits pain.  Skin:    General: Skin is warm and dry.     Capillary Refill: Capillary refill takes less than 2 seconds.  Neurological:     Mental Status: He is alert.  Psychiatric:        Mood and Affect: Mood normal.     ED Results / Procedures / Treatments   Labs (all labs ordered are listed, but only abnormal results are displayed) Labs Reviewed  COMPREHENSIVE METABOLIC PANEL - Abnormal; Notable for the following components:      Result Value   Glucose, Bld 204 (*)    Calcium 8.8 (*)    All other components within normal limits  CBC WITH DIFFERENTIAL/PLATELET    EKG None  Radiology MR HIP RIGHT W WO CONTRAST  Result Date: 09/26/2021 CLINICAL DATA:  Septic arthritis suspected. Right hip pain after walking on a treadmill 4 days ago. EXAM: MRI OF THE RIGHT HIP WITHOUT AND WITH CONTRAST TECHNIQUE: Multiplanar, multisequence MR imaging was performed both before and after administration of intravenous contrast. CONTRAST:  9.31m GADAVIST GADOBUTROL 1 MMOL/ML IV SOLN COMPARISON:  Pelvis and right hip radiographs 09/25/2021 FINDINGS: Bones: Mild superior pubic symphysis joint space narrowing and peripheral osteophytosis. No acute fracture or avascular necrosis is seen within the visualized portions of the pelvis or either proximal femur. Articular cartilage and labrum Articular cartilage: Normal morphology of the right femoral head-neck junction. Within the limitations of patient motion artifact, there is mild superior right femoral head and acetabular cartilage thinning. Labrum: Lack of intra-articular fluid limits evaluation of the right  acetabular labrum. No gross labral tear is seen. Joint or bursal effusion Joint effusion:  No joint effusion within either hip. Bursae: There is moderate fluid within the right trochanteric bursa, and this fluid also extends more inferiorly and superficial/lateral to the proximal vastus lateralis muscle (axial series 10 images 23 through 40) off of the inferior plane of view. This fluid measures up to 8 mm in transverse dimension. Muscles and tendons Muscles and tendons: The fluid within the right trochanteric bursa that extends more inferiorly, superficial vastus lateralis muscle is deep to the proximal right iliotibial band and extends toward a partially visualized region of moderate intermediate T2 signal and thickening of the right gluteus maximus tendon insertion at the posterior proximal femoral diaphyseal gluteal tuberosity (axial series 10,  images 32-40). There is edema and enhancement extending between the muscle fibers of the distal inferior right gluteus maximus muscle in this region suggesting multiple interstitial tears of the gluteus maximus muscle and the musculotendinous junction. These tears are only partially visualized on axial images but appear to be more completely visualized on coronal images (coronal series 9 images 6 through 14). Minimal anterior right gluteus medius insertional tendinosis. The bilateral gluteus minimus, left gluteus medius, and left gluteus maximus visualized muscles and tendons are within normal limits. Other findings Miscellaneous: Limited images of the intrapelvic soft tissues are grossly unremarkable. IMPRESSION: 1. No right hip joint effusion. No evidence of septic arthritis or osteomyelitis. 2. Mild fluid within the right trochanteric bursa with mild-to-moderate fluid deep to the right proximal iliotibial band and extending inferiorly to a region of edema and interstitial tears at the right gluteus maximus musculotendinous junction/the right iliotibial band insertions  on the gluteal tuberosity of the posterior proximal right femoral diaphysis. Findings appear to represent proximal iliotibial band syndrome with partial-thickness interstitial tears of the right gluteus maximus and the right iliotibial band conjoined insertion. Electronically Signed   By: Yvonne Kendall M.D.   On: 09/26/2021 08:59   DG Hip Unilat W or Wo Pelvis 2-3 Views Right  Result Date: 09/25/2021 CLINICAL DATA:  Lateral right hip pain.  No injury EXAM: DG HIP (WITH OR WITHOUT PELVIS) 2-3V RIGHT COMPARISON:  06/02/2019 FINDINGS: There is no evidence of hip fracture or dislocation. There is no evidence of significant arthropathy or other focal bone abnormality. IMPRESSION: Negative. Electronically Signed   By: Davina Poke D.O.   On: 09/25/2021 10:16    Procedures Procedures    Medications Ordered in ED Medications  gadobutrol (GADAVIST) 1 MMOL/ML injection 9.5 mL (9.5 mLs Intravenous Contrast Given 09/26/21 2751)    ED Course/ Medical Decision Making/ A&P                           Medical Decision Making Amount and/or Complexity of Data Reviewed Labs: ordered. Radiology: ordered.   This patient presents to the ED for concern of right hip pain, this involves an extensive number of treatment options, and is a complaint that carries with it a high risk of complications and morbidity.  The differential diagnosis includes osteoarthritis, septic arthritis, gout, muscular strain, contusion, muscular spasm, rheumatoid arthritis, tumor/malignancy  Co morbidities that complicate the patient evaluation  Diabetes mellitus, hyperlipidemia  Additional history obtained:   Additional history obtained from hip x-ray from 09/25/2021 External records from outside source obtained and reviewed including no acute abnormalities on x-ray ordered  Lab Tests:  N/a given laboratory studies ordered earlier today showed no acute abnormality  Imaging Studies ordered:  I ordered imaging studies  including MRI of right hip  Results: No right hip joint effusion.  No evidence of septic arthritis or osteo myelitis.  Mild fluid within the right trochanter.  [place] with mild to moderate fluid deep to the right proximal IT band and a steroid inferiorly to the region of edema and interstitial tears of the right gluteus maximus. I agree with the radiologist interpretation  Cardiac Monitoring: / EKG:  The patient was maintained on a cardiac monitor.  I personally viewed and interpreted the cardiac monitored which showed an underlying rhythm of: Sinus rhythm   Consultations Obtained:  N/a   Problem List / ED Course / Critical interventions / Medication management  Right hip pain Reevaluation of the patient  showed that the patient improved I have reviewed the patients home medicines and have made adjustments as needed   Social Determinants of Health:  Denies history of IV drug use.  Former cigarette smoker who ceased in 1996   Test / Admission - Considered:  Right hip pain Vitals signs within normal range and stable throughout visit. Laboratory/imaging studies significant for: As indicated above. Patient's symptoms most likely secondary to IT band syndrome with minimal muscular tear to his right gluteus maximus at insertion point of the IT band.  Symptomatic therapy was recommended with rest, ice, compression, stretching of affected IT band.  Stretching exercises provided.  Close orthopedic follow-up in 5 to 7 days recommended for progression of patient's symptoms.  Doubt septic arthritis.  Doubt osteomyelitis.  Doubt AVN. Worrisome signs and symptoms were discussed with the patient and the patient knowledge understanding to return to emergency department notice.  Patient was stable upon discharge.        Final Clinical Impression(s) / ED Diagnoses Final diagnoses:  It band syndrome, right    Rx / DC Orders ED Discharge Orders     None         Wilnette Kales,  PA 09/26/21 5697    Varney Biles, MD 09/27/21 1507

## 2021-09-27 ENCOUNTER — Encounter: Payer: Self-pay | Admitting: Family

## 2021-09-27 ENCOUNTER — Telehealth: Payer: Self-pay

## 2021-09-27 LAB — HEMOGLOBIN A1C: Hgb A1c MFr Bld: 6.9 % — ABNORMAL HIGH (ref 4.6–6.5)

## 2021-09-27 NOTE — Telephone Encounter (Signed)
PA initiated via Covermymeds; KEY: [id number]. Awaiting determination.

## 2021-09-28 ENCOUNTER — Ambulatory Visit: Payer: BC Managed Care – PPO | Admitting: Orthopaedic Surgery

## 2021-09-28 ENCOUNTER — Other Ambulatory Visit: Payer: Self-pay | Admitting: Family

## 2021-09-28 ENCOUNTER — Encounter: Payer: Self-pay | Admitting: Orthopaedic Surgery

## 2021-09-28 DIAGNOSIS — E782 Mixed hyperlipidemia: Secondary | ICD-10-CM

## 2021-09-28 DIAGNOSIS — M25551 Pain in right hip: Secondary | ICD-10-CM | POA: Diagnosis not present

## 2021-09-28 NOTE — Telephone Encounter (Signed)
PA denied. PA Case: [id number], Status: Denied. Notification: Completed.    Awaiting denial information.

## 2021-09-28 NOTE — Progress Notes (Signed)
Office Visit Note   Patient: Austin Meadows           Date of Birth: 12-Oct-1960           MRN: [id number] Visit Date: 09/28/2021              Requested by: Marrian Salvage, [address] [place],  [place] [address] PCP: Marrian Salvage, FNP   Assessment & Plan: Visit Diagnoses:  1. Pain in right hip     Plan: MRI reviewed with the patient.  Overall he has already felt clinical improvement.  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  Questions encouraged and answered.  Follow-Up Instructions: Return in about 8 weeks (around 11/23/2021).   Orders:  No orders of the defined types were placed in this encounter.  No orders of the defined types were placed in this encounter.     Procedures: No procedures performed   Clinical Data: No additional findings.   Subjective: Chief Complaint  Patient presents with   Right Hip - Pain    HPI Patient is a pleasant 61 year old gentleman here for right hip pain that started Saturday after walking on a treadmill and felt a twinge.  Denies any radicular symptoms.  Feels pain to the lateral hip and thigh.  No groin pain.  Went to the ER and due to severe pain underwent MRI to rule out infectious process.  MRI was negative for infection and showed tendinosis tendinosis and interstitial tears of the gluteal muscles as well as trochanteric bursitis.  Review of Systems  Constitutional: Negative.   All other systems reviewed and are negative.    Objective: Vital Signs: There were no vitals taken for this visit.  Physical Exam Vitals and nursing note reviewed.  Constitutional:      Appearance: He is well-developed.  HENT:     Head: Normocephalic and atraumatic.  Eyes:     Pupils: Pupils are equal, round, and reactive to light.  Pulmonary:     Effort: Pulmonary  effort is normal.  Abdominal:     Palpations: Abdomen is soft.  Musculoskeletal:        General: Normal range of motion.     Cervical back: Neck supple.  Skin:    General: Skin is warm.  Neurological:     Mental Status: He is alert and oriented to person, place, and time.  Psychiatric:        Behavior: Behavior normal.        Thought Content: Thought content normal.        Judgment: Judgment normal.     Ortho Exam Examination of right hip shows good range of motion without groin or real hip pain.  Strength is normal.  No sciatic tension signs. Specialty Comments:  No specialty comments available.  Imaging: No results found.   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)    type 2   Hemorrhoid    Hyperlipidemia    Jaundice of newborn    Rheumatic fever    UTI (lower urinary tract infection)     Family History  Problem Relation Age of Onset   Arthritis Mother    Cancer Mother        bone cancer   Colon cancer Father 5   Prostate cancer Father    Heart disease Father    Hypertension Father    Diabetes Father    Throat cancer Father    Colon cancer Brother 36       late 26's when dx   Cancer Brother        another cancer METS to brain   Colon cancer Brother 30   Colon cancer Other 84       Bill's daughter   Healthy Son        x 4   Healthy Daughter        x 1   Rectal cancer  Neg Hx    Stomach cancer Neg Hx    Esophageal cancer Neg Hx     Past Surgical History:  Procedure Laterality Date   COLONOSCOPY  ages [age]   done at [facility] and [facility](pt unable to get records)   Social History   Occupational History   Occupation: Diplomatic Services operational officer  Tobacco Use   Smoking status: Former    Types: Cigarettes    Quit date: 06/04/1994    Years since quitting: 27.3   Smokeless tobacco: Never  Vaping Use   Vaping Use: Never used  Substance and Sexual Activity   Alcohol use: Not Currently    Alcohol/week: 0.0 standard drinks of alcohol    Comment: Occasionally (one drink per week).  Heavier drinking in the [employer] for ~ 8 years.    Drug use: No   Sexual activity: Not on file

## 2021-09-29 NOTE — Telephone Encounter (Signed)
Called pt and was advised , pt stated he ok without patches

## 2021-09-29 NOTE — Telephone Encounter (Signed)
Pt's insurance only covers for post herpetic neuralgia or PHN.

## 2021-10-02 ENCOUNTER — Ambulatory Visit: Payer: BC Managed Care – PPO | Admitting: Family Medicine

## 2021-10-05 ENCOUNTER — Telehealth: Payer: Self-pay | Admitting: Family

## 2021-10-05 NOTE — Telephone Encounter (Signed)
Medication: oxyCODONE (ROXICODONE) 5 MG immediate release tablet   Has the patient contacted their pharmacy? No.   Preferred Pharmacy (with phone number or street name): [facility] ([address]), [place]  [address]) [place] [address]  Phone:  [phone]  Fax:  [phone]   Agent: Please be advised that RX refills may take up to 3 business days. We ask that you follow-up with your pharmacy.

## 2021-10-06 ENCOUNTER — Other Ambulatory Visit: Payer: Self-pay | Admitting: *Deleted

## 2021-10-06 NOTE — Telephone Encounter (Signed)
#  20 given on 09/26/21

## 2021-10-06 NOTE — Telephone Encounter (Signed)
Pt called back and stated he is in a lot of pain.

## 2021-10-06 NOTE — Telephone Encounter (Signed)
Spoke with pt and notified him of pcp instructions to contact ortho for future refills. Pt voiced understanding and will reach out to their office.

## 2021-10-06 NOTE — Telephone Encounter (Signed)
Routed refill request to PCP

## 2021-10-09 ENCOUNTER — Telehealth: Payer: Self-pay | Admitting: Orthopaedic Surgery

## 2021-10-09 ENCOUNTER — Telehealth: Payer: Self-pay

## 2021-10-09 ENCOUNTER — Other Ambulatory Visit: Payer: Self-pay | Admitting: Family Medicine

## 2021-10-09 MED ORDER — OXYCODONE HCL 5 MG PO TABS: 5.0000 mg | ORAL_TABLET | Freq: Three times a day (TID) | ORAL | 0 refills | Status: DC | PRN

## 2021-10-09 NOTE — Telephone Encounter (Signed)
Nurse Assessment Nurse: Marcello Moores, RN, Cheri Date/Time (Eastern Time): 10/06/2021 7:19:43 PM Confirm and document reason for call. If symptomatic, describe symptoms. ---Caller states he needs a prescription for oxycodone. States no new or worsening symptoms at this time. Does the patient have any new or worsening symptoms? ---No Please document clinical information provided and list any resource used. ---Recommended patient call back for any new or worsening symptoms. Per client directive, recommended patient call the office during regular office hours for controlled substance prescription needs and requests. Disp. Time Eilene Ghazi Time) Disposition Final User 10/06/2021 6:58:44 PM Send To Nurse Graylon Gunning, RN, Rhonda 10/06/2021 7:22:15 PM Clinical Call Yes Marcello Moores, RN, [place] Final Disposition 10/06/2021 7:22:15 PM Clinical Call Yes Marcello Moores, RN, [place]

## 2021-10-09 NOTE — Addendum Note (Signed)
Addended by: Lamar Blinks C on: 10/09/2021 12:22 PM   Modules accepted: Orders

## 2021-10-09 NOTE — Telephone Encounter (Signed)
Requesting: oxycodone '5mg'$   Contract: None UDS: None Last Visit:09/26/21 Next Visit: None Last Refill: 09/26/21 #20 and 0RF  Please Advise

## 2021-10-09 NOTE — Telephone Encounter (Signed)
Pt calling back regarding refill. Please advise.

## 2021-10-09 NOTE — Telephone Encounter (Signed)
Received $25.00 cash and medical records release form from patient /   Forwarding to [facility] today

## 2021-10-19 ENCOUNTER — Other Ambulatory Visit: Payer: Self-pay | Admitting: Family

## 2021-10-19 ENCOUNTER — Other Ambulatory Visit: Payer: Self-pay | Admitting: Physician Assistant

## 2021-10-19 ENCOUNTER — Encounter: Payer: Self-pay | Admitting: Orthopaedic Surgery

## 2021-10-19 ENCOUNTER — Other Ambulatory Visit: Payer: Self-pay | Admitting: Family Medicine

## 2021-10-19 MED ORDER — IBUPROFEN 600 MG PO TABS: 600.0000 mg | ORAL_TABLET | Freq: Four times a day (QID) | ORAL | 0 refills | Status: DC | PRN

## 2021-10-19 NOTE — Telephone Encounter (Signed)
Requesting:oxycodone 5 mg Contract:unknown SPZ:[id number] Last Visit:09/26/20 Next Visit:unknown Last Refill:10/09/21  Please Advise   Does pt need a refill

## 2021-10-19 NOTE — Telephone Encounter (Signed)
I have refilled the ibuprofen, but cannot refill any narcotics.  Happy to send in tylenol 3 or tramadol if he would like

## 2021-11-21 ENCOUNTER — Ambulatory Visit: Payer: BC Managed Care – PPO | Admitting: Orthopaedic Surgery

## 2021-11-21 DIAGNOSIS — M25551 Pain in right hip: Secondary | ICD-10-CM | POA: Diagnosis not present

## 2021-11-21 NOTE — Progress Notes (Signed)
Office Visit Note   Patient: Austin Meadows           Date of Birth: 03/03/1961           MRN: [id number] Visit Date: 11/21/2021              Requested by: Marrian Salvage, [address] [place],   [address] PCP: Marrian Salvage, FNP   Assessment & Plan: Visit Diagnoses:  1. Pain in right hip     Plan: Austin Meadows is improving appropriately.  Sounds like he needs another month off from work so he can fully heal.  Work note provided today.  Symptomatic treatment as needed.  Follow-up as needed.  Follow-Up Instructions: No follow-ups on file.   Orders:  No orders of the defined types were placed in this encounter.  No orders of the defined types were placed in this encounter.     Procedures: No procedures performed   Clinical Data: No additional findings.   Subjective: Chief Complaint  Patient presents with   Right Hip - Routine Post Op    HPI Austin Meadows returns today for follow-up of right hip pain.  Feels better overall but still limited with certain movements.  Feels some mild symptoms that gets better with ibuprofen.  Has not needed oxycodone for 3 weeks.  Review of Systems   Objective: Vital Signs: There were no vitals taken for this visit.  Physical Exam  Ortho Exam Lamination of right hip shows a tenderness to the gluteus maximus insertion.  Hip range of motion is well-tolerated. Specialty Comments:  No specialty comments available.  Imaging: No results found.   PMFS History: Patient Active Problem List   Diagnosis Date Noted   Pain in right hip 09/28/2021   Gastroesophageal reflux disease 11/28/2018   Erectile dysfunction 12/12/2017   Hypercholesteremia 12/12/2017   Routine general medical examination at a health care facility 04/12/2017   Adhesive capsulitis of left shoulder 02/05/2017   CIDP (chronic inflammatory demyelinating polyneuropathy) ([facility]) 04/11/2015   Uncontrolled type 2 diabetes mellitus with  hyperglycemia ([facility]) 06/24/2014   Family history of colon cancer 06/09/2014   Past Medical History:  Diagnosis Date   Chicken pox    Diabetes mellitus without complication ([facility])    type 2   Hemorrhoid    Hyperlipidemia    Jaundice of newborn    Rheumatic fever    UTI (lower urinary tract infection)     Family History  Problem Relation Age of Onset   Arthritis Mother    Cancer Mother        bone cancer   Colon cancer Father 65   Prostate cancer Father    Heart disease Father    Hypertension Father    Diabetes Father    Throat cancer Father    Colon cancer Brother 42       late 49's when dx   Cancer Brother        another cancer METS to brain   Colon cancer Brother 42   Colon cancer Other 43       Bill's daughter   Healthy Son        x 4   Healthy Daughter        x 1   Rectal cancer Neg Hx    Stomach cancer Neg Hx    Esophageal cancer Neg Hx     Past Surgical History:  Procedure Laterality Date   COLONOSCOPY  ages  [age]   done at [facility] and [facility](pt unable to get records)   Social History   Occupational History   Occupation: Diplomatic Services operational officer  Tobacco Use   Smoking status: Former    Types: Cigarettes    Quit date: 06/04/1994    Years since quitting: 27.4   Smokeless tobacco: Never  Vaping Use   Vaping Use: Never used  Substance and Sexual Activity   Alcohol use: Not Currently    Alcohol/week: 0.0 standard drinks of alcohol    Comment: Occasionally (one drink per week).  Heavier drinking in the [employer] for ~ 8 years.    Drug use: No   Sexual activity: Not on file

## 2022-03-20 ENCOUNTER — Encounter: Payer: Self-pay | Admitting: Family

## 2022-04-14 ENCOUNTER — Other Ambulatory Visit: Payer: Self-pay | Admitting: Family

## 2022-04-14 DIAGNOSIS — E782 Mixed hyperlipidemia: Secondary | ICD-10-CM

## 2022-06-18 ENCOUNTER — Encounter: Payer: Self-pay | Admitting: *Deleted

## 2022-07-25 ENCOUNTER — Other Ambulatory Visit: Payer: Self-pay | Admitting: Family Medicine

## 2022-07-25 DIAGNOSIS — E782 Mixed hyperlipidemia: Secondary | ICD-10-CM

## 2022-08-23 IMAGING — CR DG LUMBAR SPINE COMPLETE 4+V
5 series · 5 of 5 positions shown · non-contrast
Comparison: 02/15/2015

CLINICAL DATA: Recent fall 2 days ago, low back pain

EXAM:
LUMBAR SPINE - COMPLETE 4+ VIEW

[t l-spine a.p.]
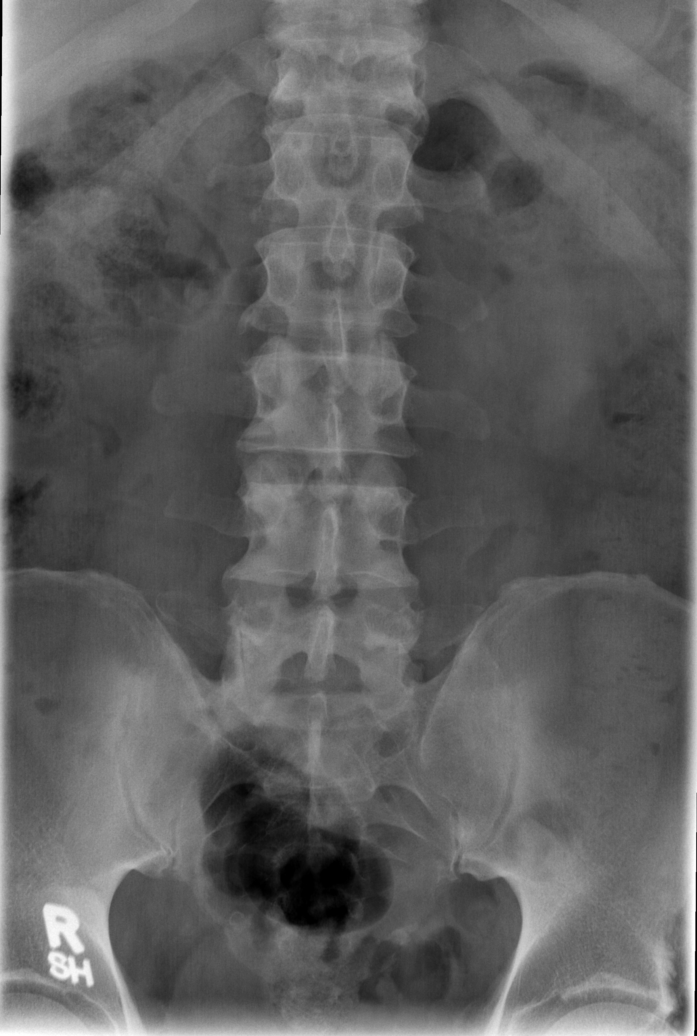

[t l-spine oblique exposure (1 of 2)]
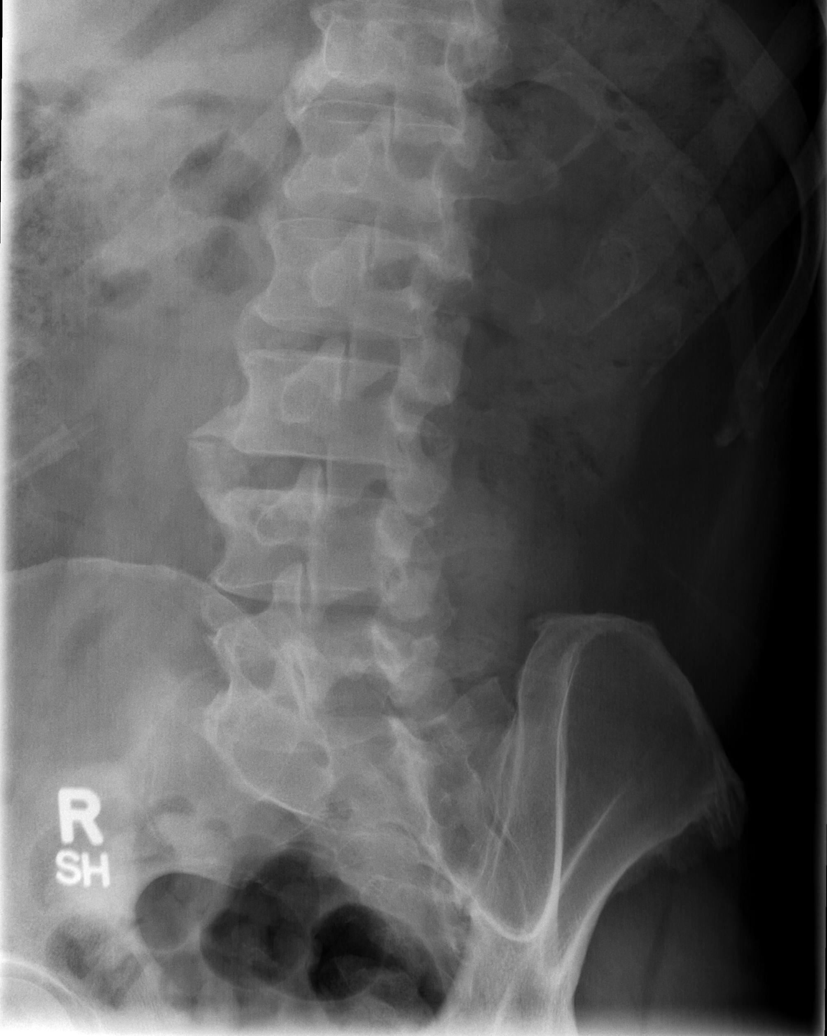

[t l-spine oblique exposure (2 of 2)]
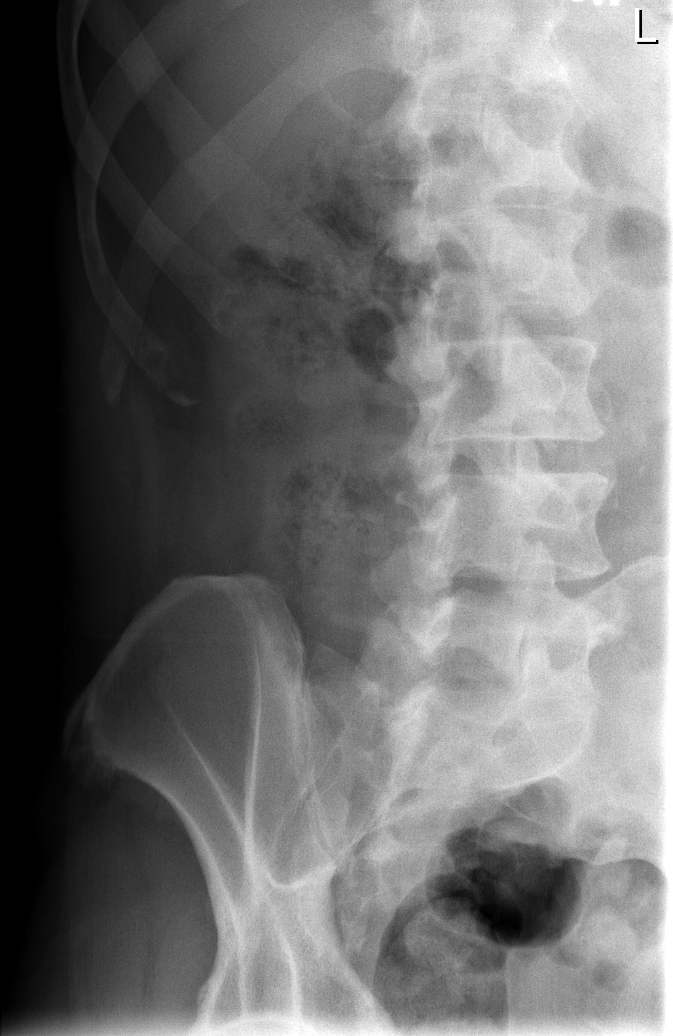

[t l-spine lat]
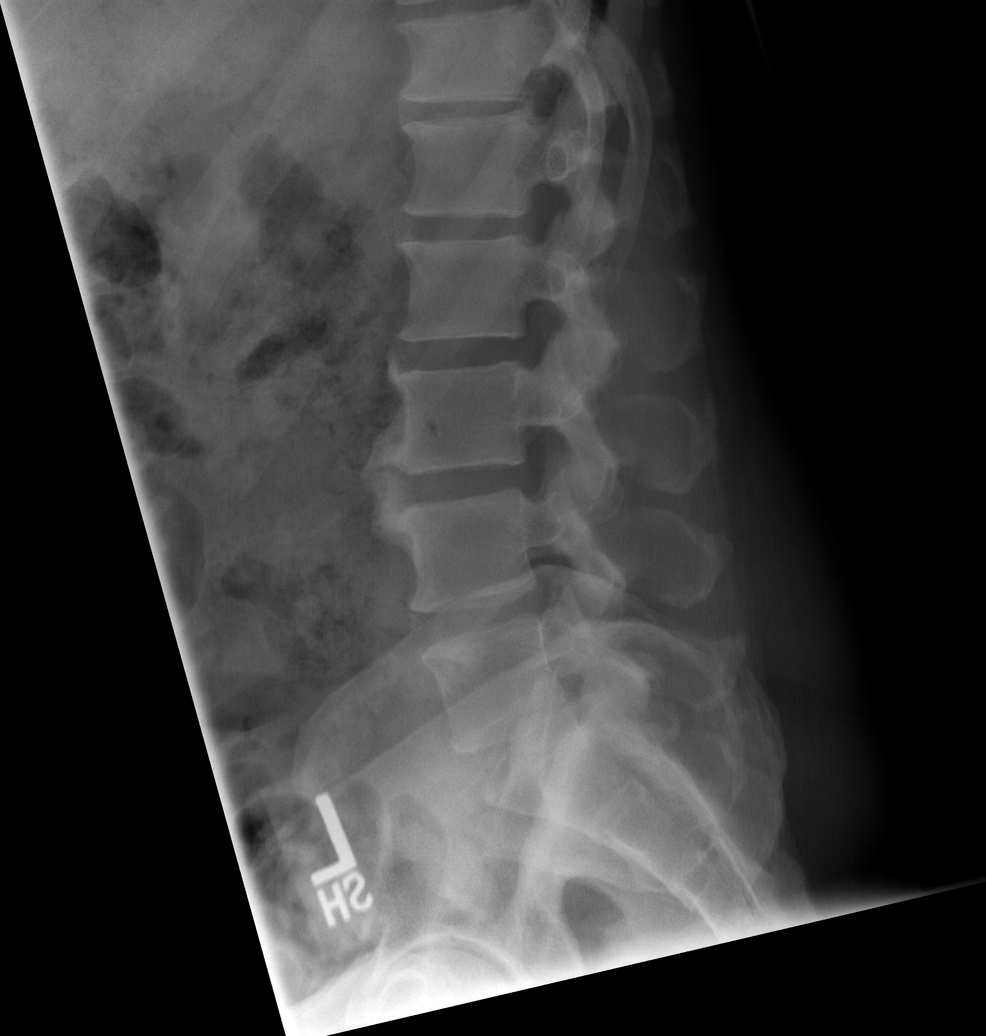

[t l-spine l5-s1 spot]
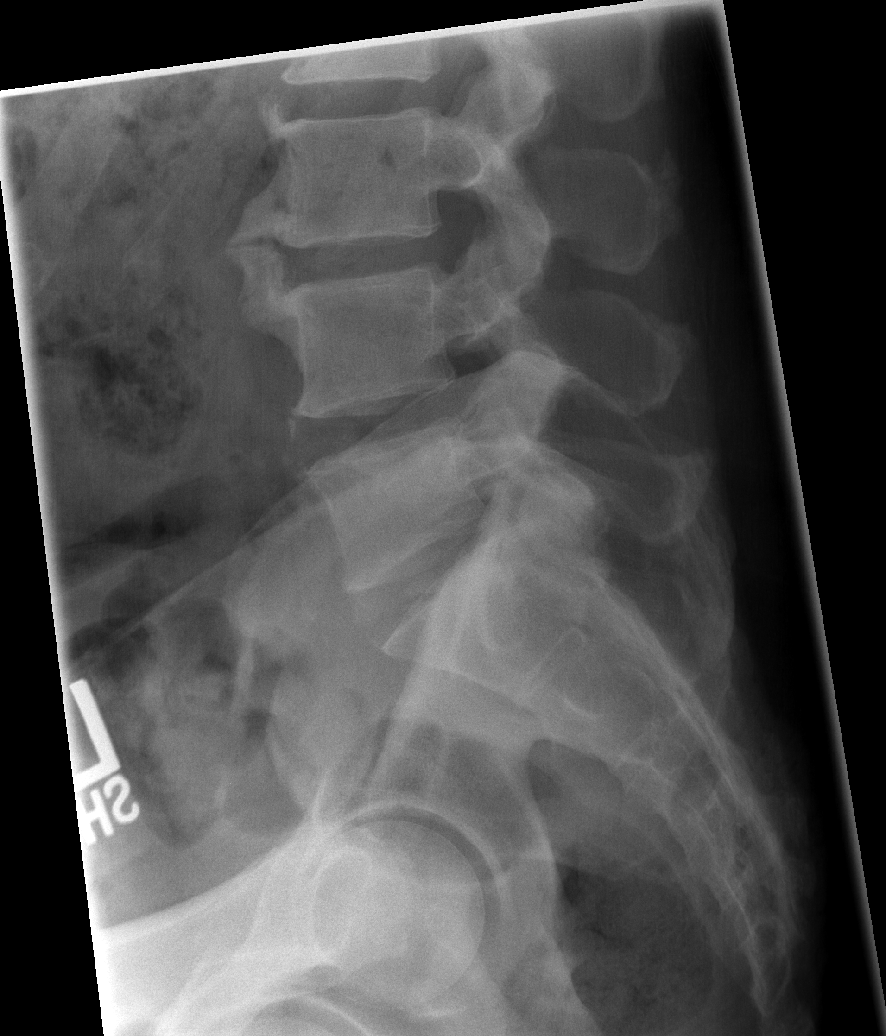

[5 of 5 positions shown; findings below may reference images not displayed]

FINDINGS: Normal alignment without acute osseous finding, compression
fracture, focal kyphosis. Preserved vertebral body heights. Large
anterior osteophytes at L3-4. Overall preserved disc spaces. Facets
are aligned. No pars defects. Normal appearing pedicles and SI
joints for age.
IMPRESSION: No acute abnormality.  Degenerative changes as above.

## 2022-09-01 ENCOUNTER — Other Ambulatory Visit: Payer: Self-pay | Admitting: Family

## 2022-09-01 DIAGNOSIS — E782 Mixed hyperlipidemia: Secondary | ICD-10-CM

## 2022-10-26 ENCOUNTER — Ambulatory Visit (HOSPITAL_BASED_OUTPATIENT_CLINIC_OR_DEPARTMENT_OTHER): Admission: RE | Admit: 2022-10-26 | Payer: 59 | Source: Ambulatory Visit

## 2022-10-26 ENCOUNTER — Ambulatory Visit (INDEPENDENT_AMBULATORY_CARE_PROVIDER_SITE_OTHER): Payer: 59 | Admitting: Family

## 2022-10-26 ENCOUNTER — Encounter: Payer: Self-pay | Admitting: Family

## 2022-10-26 VITALS — BP 130/72 | HR 76 | Resp 19 | Ht 72.0 in | Wt 209.5 lb

## 2022-10-26 DIAGNOSIS — E119 Type 2 diabetes mellitus without complications: Secondary | ICD-10-CM | POA: Diagnosis not present

## 2022-10-26 DIAGNOSIS — M25512 Pain in left shoulder: Secondary | ICD-10-CM | POA: Insufficient documentation

## 2022-10-26 DIAGNOSIS — E782 Mixed hyperlipidemia: Secondary | ICD-10-CM

## 2022-10-26 DIAGNOSIS — Z125 Encounter for screening for malignant neoplasm of prostate: Secondary | ICD-10-CM

## 2022-10-26 LAB — CBC WITH DIFFERENTIAL/PLATELET
Basophils Absolute: 0.1 10*3/uL (ref 0.0–0.1)
Basophils Relative: 0.8 % (ref 0.0–3.0)
Eosinophils Absolute: 0.2 10*3/uL (ref 0.0–0.7)
Eosinophils Relative: 2.8 % (ref 0.0–5.0)
HCT: 47.2 % (ref 39.0–52.0)
Hemoglobin: 15.2 g/dL (ref 13.0–17.0)
Lymphocytes Relative: 32 % (ref 12.0–46.0)
Lymphs Abs: 2.1 10*3/uL (ref 0.7–4.0)
MCHC: 32.3 g/dL (ref 30.0–36.0)
MCV: 87.4 fl (ref 78.0–100.0)
Monocytes Absolute: 0.6 10*3/uL (ref 0.1–1.0)
Monocytes Relative: 9.1 % (ref 3.0–12.0)
Neutro Abs: 3.7 10*3/uL (ref 1.4–7.7)
Neutrophils Relative %: 55.3 % (ref 43.0–77.0)
Platelets: 192 10*3/uL (ref 150.0–400.0)
RBC: 5.4 Mil/uL (ref 4.22–5.81)
RDW: 13.9 % (ref 11.5–15.5)
WBC: 6.7 10*3/uL (ref 4.0–10.5)

## 2022-10-26 LAB — LIPID PANEL
Cholesterol: 226 mg/dL — ABNORMAL HIGH (ref 0–200)
HDL: 47 mg/dL (ref >39.00)
LDL Cholesterol: 161 mg/dL — ABNORMAL HIGH (ref 0–99)
NonHDL: 178.73
Total CHOL/HDL Ratio: 5
Triglycerides: 88 mg/dL (ref 0.0–149.0)
VLDL: 17.6 mg/dL (ref 0.0–40.0)

## 2022-10-26 LAB — COMPREHENSIVE METABOLIC PANEL
ALT: 20 U/L (ref 0–53)
AST: 17 U/L (ref 0–37)
Albumin: 4.4 g/dL (ref 3.5–5.2)
Alkaline Phosphatase: 53 U/L (ref 39–117)
BUN: 21 mg/dL (ref 6–23)
CO2: 29 mEq/L (ref 19–32)
Calcium: 9.3 mg/dL (ref 8.4–10.5)
Chloride: 103 mEq/L (ref 96–112)
Creatinine, Ser: 0.9 mg/dL (ref 0.40–1.50)
GFR: 93.14 mL/min (ref >60.00)
Glucose, Bld: 134 mg/dL — ABNORMAL HIGH (ref 70–99)
Potassium: 4.4 mEq/L (ref 3.5–5.1)
Sodium: 139 mEq/L (ref 135–145)
Total Bilirubin: 0.5 mg/dL (ref 0.2–1.2)
Total Protein: 6.9 g/dL (ref 6.0–8.3)

## 2022-10-26 LAB — HEMOGLOBIN A1C: Hgb A1c MFr Bld: 7.1 % — ABNORMAL HIGH (ref 4.6–6.5)

## 2022-10-26 LAB — PSA: PSA: 2.19 ng/mL (ref 0.10–4.00)

## 2022-10-26 MED ORDER — OMEPRAZOLE 40 MG PO CPDR: 40.0000 mg | DELAYED_RELEASE_CAPSULE | Freq: Every day | ORAL | 3 refills | Status: DC

## 2022-10-26 MED ORDER — ATORVASTATIN CALCIUM 20 MG PO TABS
20.0000 mg | ORAL_TABLET | Freq: Every day | ORAL | 3 refills | Status: DC
Start: 2022-10-26 — End: 2023-11-27

## 2022-10-26 NOTE — Progress Notes (Signed)
Austin Meadows is a 62 y.o. 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 60 tablet 0   lidocaine (LIDODERM) 5 % Place 1 patch onto the skin daily. Remove & Discard patch within 12 hours or as directed by MD 30 patch 0   PROCTOFOAM HC rectal foam PLACE 1 APPLICATORFUL RECTALLY  THREE TIMES DAILY 10 g 0   atorvastatin (LIPITOR) 20 MG tablet Take 1 tablet (20 mg total) by mouth daily. 90 tablet 3   fluticasone (FLONASE) 50 MCG/ACT nasal spray Place 2 sprays into both nostrils daily. (Patient not taking: Reported on 10/26/2022) 16 g 6   omeprazole (PRILOSEC) 40 MG capsule Take 1 capsule (40 mg total) by mouth daily. 90 capsule 3   No current facility-administered medications for this visit.    Allergies: Influenza vaccines, Metformin and related, and Penicillins  Past Medical History:  Diagnosis Date   Chicken pox    Diabetes mellitus without complication (HCC)    type 2   Hemorrhoid    Hyperlipidemia    Jaundice of newborn    Rheumatic fever    UTI (lower urinary tract infection)     Past Surgical History:  Procedure Laterality Date   COLONOSCOPY  ages [age]   done at [facility] and [facility](pt unable to get records)    Family History  Problem Relation Age of Onset   Arthritis Mother     Cancer Mother        bone cancer   Colon cancer Father 45   Prostate cancer Father    Heart disease Father    Hypertension Father    Diabetes Father    Throat cancer Father    Colon cancer Brother 93       late 60's when dx   Cancer Brother        another cancer METS to brain   Colon cancer Brother 36   Colon cancer Other 30       Bill's daughter   Healthy Son        x 4   Healthy Daughter        x 1   Rectal cancer Neg Hx    Stomach cancer Neg Hx    Esophageal cancer Neg Hx     Social History   Tobacco Use   Smoking status: Former    Current packs/day: 0.00    Types: Cigarettes    Quit date: 06/04/1994    Years since quitting: 28.4   Smokeless tobacco: Never  Substance Use Topics   Alcohol use: Not Currently    Alcohol/week: 0.0 standard drinks of alcohol    Comment: Occasionally (one drink per week).  Heavier drinking in the [employer] for ~ 8 years.     Subjective:   Shoulder pain x 1 year- left shoulder; no injury or trauma; feels  pain more noticeable with certain movements;   Needs to get yearly labs updated;   Objective:  Vitals:   10/26/22 0923  BP: 130/72  Pulse: 76  Resp: 19  SpO2: 98%  Weight: 209 lb 8 oz (95 kg)  Height: 6' (1.829 m)    General: Well developed, well nourished, in no acute distress  Skin : Warm and dry.  Head: Normocephalic and atraumatic  Eyes: Sclera and conjunctiva clear; pupils round and reactive to light; extraocular movements intact  Ears: External normal; canals clear; tympanic membranes normal  Oropharynx: Pink, supple. No suspicious lesions  Neck: Supple without thyromegaly, adenopathy  Lungs: Respirations unlabored; clear to auscultation bilaterally without wheeze, rales, rhonchi  CVS exam: normal rate and regular rhythm.  Abdomen: Soft; nontender; nondistended; normoactive bowel sounds; no masses or hepatosplenomegaly  Musculoskeletal: No deformities; crepitation noted on exam left shoulder Extremities: No edema,  cyanosis, clubbing  Vessels: Symmetric bilaterally  Neurologic: Alert and oriented; speech intact; face symmetrical; moves all extremities well; CNII-XII intact without focal deficit   Assessment:  1. Type 2 diabetes mellitus without complication, without long-term current use of insulin (HCC)   2. Mixed hyperlipidemia   3. Prostate cancer screening   4. Acute pain of left shoulder   5. Diet-controlled diabetes mellitus (HCC)     Plan:  Not currently taking any medication- diet controlled; will update labs today; continue diet and exercise; Check lipid panel; refill updated;  Check PSA; Rx for left shoulder X-ray; will refer to orthopedist;  No follow-ups on file.  Orders Placed This Encounter  Procedures   DG Shoulder Left    Standing Status:   Future    Number of Occurrences:   1    Standing Expiration Date:   04/28/2023    Order Specific Question:   Reason for Exam (SYMPTOM  OR DIAGNOSIS REQUIRED)    Answer:   left shoulder pain    Order Specific Question:   Preferred imaging location?    Answer:   [facility]   Urine Microalbumin w/creat. ratio   PSA   CBC with Differential/Platelet   Comp Met (CMET)   Lipid panel   Hemoglobin A1c    Requested Prescriptions   Signed Prescriptions Disp Refills   atorvastatin (LIPITOR) 20 MG tablet 90 tablet 3    Sig: Take 1 tablet (20 mg total) by mouth daily.   omeprazole (PRILOSEC) 40 MG capsule 90 capsule 3    Sig: Take 1 capsule (40 mg total) by mouth daily.

## 2022-10-31 ENCOUNTER — Other Ambulatory Visit: Payer: Self-pay | Admitting: Family

## 2022-10-31 DIAGNOSIS — E119 Type 2 diabetes mellitus without complications: Secondary | ICD-10-CM

## 2022-11-02 ENCOUNTER — Other Ambulatory Visit: Payer: Self-pay | Admitting: Family

## 2022-11-02 DIAGNOSIS — G8929 Other chronic pain: Secondary | ICD-10-CM

## 2022-11-09 ENCOUNTER — Other Ambulatory Visit: Payer: Self-pay | Admitting: Family

## 2022-11-09 ENCOUNTER — Encounter: Payer: Self-pay | Admitting: Family

## 2022-11-09 ENCOUNTER — Ambulatory Visit (INDEPENDENT_AMBULATORY_CARE_PROVIDER_SITE_OTHER): Payer: 59 | Admitting: Sports Medicine

## 2022-11-09 ENCOUNTER — Encounter: Payer: Self-pay | Admitting: Sports Medicine

## 2022-11-09 ENCOUNTER — Ambulatory Visit (INDEPENDENT_AMBULATORY_CARE_PROVIDER_SITE_OTHER): Payer: 59 | Admitting: Orthopaedic Surgery

## 2022-11-09 ENCOUNTER — Ambulatory Visit: Payer: Self-pay

## 2022-11-09 ENCOUNTER — Encounter: Payer: Self-pay | Admitting: Orthopaedic Surgery

## 2022-11-09 DIAGNOSIS — G8929 Other chronic pain: Secondary | ICD-10-CM

## 2022-11-09 DIAGNOSIS — M19012 Primary osteoarthritis, left shoulder: Secondary | ICD-10-CM

## 2022-11-09 DIAGNOSIS — M25512 Pain in left shoulder: Secondary | ICD-10-CM | POA: Diagnosis not present

## 2022-11-09 MED ORDER — METHYLPREDNISOLONE ACETATE 40 MG/ML IJ SUSP
40.0000 mg | INTRAMUSCULAR | Status: AC | PRN
Start: 2022-11-09 — End: 2022-11-09
  Administered 2022-11-09: 40 mg via INTRA_ARTICULAR

## 2022-11-09 MED ORDER — BUPIVACAINE HCL 0.25 % IJ SOLN
2.0000 mL | INTRAMUSCULAR | Status: AC | PRN
Start: 2022-11-09 — End: 2022-11-09
  Administered 2022-11-09: 2 mL via INTRA_ARTICULAR

## 2022-11-09 MED ORDER — PANTOPRAZOLE SODIUM 40 MG PO TBEC: 40.0000 mg | DELAYED_RELEASE_TABLET | Freq: Every day | ORAL | 3 refills | Status: DC

## 2022-11-09 MED ORDER — LIDOCAINE HCL 1 % IJ SOLN
2.0000 mL | INTRAMUSCULAR | Status: AC | PRN
Start: 2022-11-09 — End: 2022-11-09
  Administered 2022-11-09: 2 mL

## 2022-11-09 NOTE — Progress Notes (Signed)
Office Visit Note   Patient: Austin Meadows           Date of Birth: 21-Jun-1960           MRN: [id number] Visit Date: 11/09/2022              Requested by: Olive Bass, FNP [address] [place],  [place] [address] PCP: Olive Bass, FNP   Assessment & Plan: Visit Diagnoses:  1. Chronic left shoulder pain     Plan: [name] is a 62 year old gentleman with left shoulder glenohumeral osteoarthritis flare.  Treatment options discussed and he would like to try glenohumeral cortisone injection.  We will send him to Dr. Shon Baton for this.  He will follow-up in 6 weeks if symptoms persist.  Follow-Up Instructions: No follow-ups on file.   Orders:  No orders of the defined types were placed in this encounter.  No orders of the defined types were placed in this encounter.     Procedures: No procedures performed   Clinical Data: No additional findings.   Subjective: Chief Complaint  Patient presents with   Left Shoulder - Pain    HPI Patient is a 62 year old gentleman who comes in for left shoulder pain that started earlier this year.  Feels pain to the upper arm and shoulder area.  He is right-hand dominant.  He denies any numbness and tingling or radicular symptoms.  He reports a constant ache.  Review of Systems  Constitutional: Negative.   HENT: Negative.    Eyes: Negative.   Respiratory: Negative.    Cardiovascular: Negative.   Gastrointestinal: Negative.   Endocrine: Negative.   Genitourinary: Negative.   Skin: Negative.   Allergic/Immunologic: Negative.   Neurological: Negative.   Hematological: Negative.   Psychiatric/Behavioral: Negative.    All other systems reviewed and are negative.    Objective: Vital Signs: There were no vitals taken for this visit.  Physical Exam Vitals and nursing note reviewed.  Constitutional:      Appearance: He is well-developed.  HENT:     Head: Normocephalic and atraumatic.  Eyes:      Pupils: Pupils are equal, round, and reactive to light.  Pulmonary:     Effort: Pulmonary effort is normal.  Abdominal:     Palpations: Abdomen is soft.  Musculoskeletal:        General: Normal range of motion.     Cervical back: Neck supple.  Skin:    General: Skin is warm.  Neurological:     Mental Status: He is alert and oriented to person, place, and time.  Psychiatric:        Behavior: Behavior normal.        Thought Content: Thought content normal.        Judgment: Judgment normal.     Ortho Exam Examination of the left shoulder shows decreased forward flexion external rotation and internal rotation.  Manual muscle testing of the rotator cuff is normal.  Slight pain with impingement testing. Specialty Comments:  No specialty comments available.  Imaging: No results found.   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   Uncontrolled type 2 diabetes mellitus  with hyperglycemia (HCC) 06/24/2014   Family history of colon cancer 06/09/2014   Past Medical History:  Diagnosis Date   Chicken pox    Diabetes mellitus without complication (HCC)    type 2   Hemorrhoid    Hyperlipidemia    Jaundice of newborn    Rheumatic fever    UTI (lower urinary tract infection)     Family History  Problem Relation Age of Onset   Arthritis Mother    Cancer Mother        bone cancer   Colon cancer Father 46   Prostate cancer Father    Heart disease Father    Hypertension Father    Diabetes Father    Throat cancer Father    Colon cancer Brother 65       late 13's when dx   Cancer Brother        another cancer METS  to brain   Colon cancer Brother 66   Colon cancer Other 49       Bill's daughter   Healthy Son        x 4   Healthy Daughter        x 1   Rectal cancer Neg Hx    Stomach cancer Neg Hx    Esophageal cancer Neg Hx     Past Surgical History:  Procedure Laterality Date   COLONOSCOPY  ages [age]   done at [facility](pt unable to get records)   Social History   Occupational History   Occupation: Child psychotherapist  Tobacco Use   Smoking status: Former    Current packs/day: 0.00    Types: Cigarettes    Quit date: 06/04/1994    Years since quitting: 28.4   Smokeless tobacco: Never  Vaping Use   Vaping status: Never Used  Substance and Sexual Activity   Alcohol use: Not Currently    Alcohol/week: 0.0 standard drinks of alcohol    Comment: Occasionally (one drink per week).  Heavier drinking in the [employer] for ~ 8 years.    Drug use: No   Sexual activity: Not on file

## 2022-11-09 NOTE — Progress Notes (Signed)
   Procedure Note  Patient: Austin Meadows             Date of Birth: [birth year]           MRN: [id number]             Visit Date: 11/09/2022  Procedures: Visit Diagnoses:  1. Chronic left shoulder pain   2. Primary osteoarthritis, left shoulder    Large Joint Inj: L glenohumeral on 11/09/2022 8:24 AM Indications: pain Details: 22 G 3.5 in needle, ultrasound-guided posterior approach Medications: 2 mL lidocaine 1 %; 2 mL bupivacaine 0.25 %; 40 mg methylPREDNISolone acetate 40 MG/ML Outcome: tolerated well, no immediate complications  US-guided glenohumeral joint injection, left shoulder After discussion on risks/benefits/indications, informed verbal consent was obtained. A timeout was then performed. The patient was positioned lying lateral recumbent on examination table. The patient's shoulder was prepped with betadine and multiple alcohol swabs and utilizing ultrasound guidance, the patient's glenohumeral joint was identified on ultrasound. Using ultrasound guidance a 22-gauge, 3.5 inch needle with a mixture of 2:2:1 cc's lidocaine:bupivicaine:depomedrol was directed from a lateral to medial direction via in-plane technique into the glenohumeral joint with visualization of appropriate spread of injectate into the joint. Patient tolerated the procedure well without immediate complications.      Procedure, treatment alternatives, risks and benefits explained, specific risks discussed. Consent was given by the patient. Immediately prior to procedure a time out was called to verify the correct patient, procedure, equipment, support staff and site/side marked as required. Patient was prepped and draped in the usual sterile fashion.     -Patient tolerated well, no adverse effects, discussed postinjection protocol -He will follow-up with Dr. Roda Shutters as indicated; can follow-up with me as needed  Madelyn Brunner, DO Primary Care Sports Medicine Physician  [facility] - Orthopedics  This note  was dictated using Dragon naturally speaking software and may contain errors in syntax, spelling, or content which have not been identified prior to signing this note.

## 2022-12-24 ENCOUNTER — Encounter: Payer: Self-pay | Admitting: Orthopaedic Surgery

## 2022-12-24 ENCOUNTER — Encounter: Payer: Self-pay | Admitting: Family

## 2022-12-24 MED ORDER — IBUPROFEN 600 MG PO TABS: 600.0000 mg | ORAL_TABLET | Freq: Four times a day (QID) | ORAL | 6 refills | Status: DC | PRN

## 2022-12-26 ENCOUNTER — Other Ambulatory Visit: Payer: Self-pay

## 2022-12-26 DIAGNOSIS — G8929 Other chronic pain: Secondary | ICD-10-CM

## 2022-12-26 NOTE — Telephone Encounter (Signed)
Please order MRI w/o contrast of the left shoulder. Thanks.

## 2022-12-28 ENCOUNTER — Telehealth: Payer: Self-pay | Admitting: Family

## 2022-12-28 ENCOUNTER — Other Ambulatory Visit: Payer: Self-pay | Admitting: Family

## 2022-12-28 MED ORDER — BENZONATATE 200 MG PO CAPS: 200.0000 mg | ORAL_CAPSULE | Freq: Three times a day (TID) | ORAL | 0 refills | Status: DC | PRN

## 2022-12-28 MED ORDER — NIRMATRELVIR/RITONAVIR (PAXLOVID)TABLET: 3.0000 | ORAL_TABLET | Freq: Two times a day (BID) | ORAL | 0 refills | Status: AC

## 2022-12-28 NOTE — Telephone Encounter (Signed)
Spoke with pt, pt is aware and expressed understanding. Pt stated he has already called his insurance and stated [employer] does not cover paxlovid. Pt states he will try some OTC medication and get some rest over the weekend.

## 2022-12-28 NOTE — Telephone Encounter (Signed)
Pt called stating that insurance was only going to cover ~$600 for the paxlovid that was sent in and he would be responsible for ~$400. Advised pt to reach out to insurance for a preferred list of medications.  Pt called back stating that [employer] refused to give him the list and that we would have to send in something else to be ran to determine OOP expense to pt.   Through previous experiences in similar matters, have never heard of insurance denying pt a list of preferred meds before  Please Advise.

## 2022-12-28 NOTE — Telephone Encounter (Signed)
Patient's wife was seen for virtual visit; she has been sick since Sunday evening and tested positive for COVID yesterday; He also joined virtual visit and notes he started feeling sick yesterday and tested positive yesterday as well; requesting treatment for COVID and cough; Labs were done in August- GFR at 75;  Sent in Paxlovid and Tessalon perles; increase fluids, rest and follow up worse, no better.

## 2023-01-01 ENCOUNTER — Encounter: Payer: Self-pay | Admitting: Orthopaedic Surgery

## 2023-01-21 ENCOUNTER — Ambulatory Visit
Admission: RE | Admit: 2023-01-21 | Discharge: 2023-01-21 | Disposition: A | Discharge status: Home / Self Care | Payer: 59 | Source: Ambulatory Visit | Attending: Orthopaedic Surgery | Admitting: Orthopaedic Surgery

## 2023-01-21 DIAGNOSIS — G8929 Other chronic pain: Secondary | ICD-10-CM

## 2023-01-24 ENCOUNTER — Telehealth: Payer: Self-pay | Admitting: Neurology

## 2023-01-24 ENCOUNTER — Encounter: Payer: Self-pay | Admitting: Family

## 2023-01-24 NOTE — Telephone Encounter (Signed)
Caller stated he would like to speak with Dr Allena Katz or nurse. Curious about wave teach therapy and if he could get more information on it.

## 2023-01-24 NOTE — Telephone Encounter (Signed)
Called patient and informed him that Dr. Allena Katz is not familiar with the wave tech therapy so unfortunately she does not have any information on it. Patient verbalized understanding and thanked me for the call.

## 2023-01-24 NOTE — Telephone Encounter (Signed)
I am not familiar with this so do not have any information about it.

## 2023-01-30 ENCOUNTER — Other Ambulatory Visit (INDEPENDENT_AMBULATORY_CARE_PROVIDER_SITE_OTHER): Payer: 59

## 2023-01-30 DIAGNOSIS — E119 Type 2 diabetes mellitus without complications: Secondary | ICD-10-CM | POA: Diagnosis not present

## 2023-01-30 LAB — COMPREHENSIVE METABOLIC PANEL
ALT: 32 U/L (ref 0–53)
AST: 20 U/L (ref 0–37)
Albumin: 4.3 g/dL (ref 3.5–5.2)
Alkaline Phosphatase: 57 U/L (ref 39–117)
BUN: 19 mg/dL (ref 6–23)
CO2: 27 meq/L (ref 19–32)
Calcium: 9.1 mg/dL (ref 8.4–10.5)
Chloride: 105 meq/L (ref 96–112)
Creatinine, Ser: 0.92 mg/dL (ref 0.40–1.50)
GFR: 90.54 mL/min (ref >60.00)
Glucose, Bld: 124 mg/dL — ABNORMAL HIGH (ref 70–99)
Potassium: 4.5 meq/L (ref 3.5–5.1)
Sodium: 139 meq/L (ref 135–145)
Total Bilirubin: 0.5 mg/dL (ref 0.2–1.2)
Total Protein: 6.5 g/dL (ref 6.0–8.3)

## 2023-01-30 LAB — HEMOGLOBIN A1C: Hgb A1c MFr Bld: 7.2 % — ABNORMAL HIGH (ref 4.6–6.5)

## 2023-02-08 ENCOUNTER — Encounter: Payer: Self-pay | Admitting: Orthopaedic Surgery

## 2023-02-08 ENCOUNTER — Ambulatory Visit (INDEPENDENT_AMBULATORY_CARE_PROVIDER_SITE_OTHER): Payer: 59 | Admitting: Orthopaedic Surgery

## 2023-02-08 DIAGNOSIS — M7542 Impingement syndrome of left shoulder: Secondary | ICD-10-CM | POA: Diagnosis not present

## 2023-02-08 DIAGNOSIS — M19012 Primary osteoarthritis, left shoulder: Secondary | ICD-10-CM | POA: Diagnosis not present

## 2023-02-08 NOTE — Progress Notes (Signed)
Office Visit Note   Patient: Austin Meadows           Date of Birth: 1960-12-05           MRN: [id number] Visit Date: 02/08/2023              Requested by: Austin Bass, FNP [address] [place],  [place] [address] PCP: Austin Bass, FNP   Assessment & Plan: Visit Diagnoses:  1. Impingement syndrome of left shoulder   2. Arthritis of left acromioclavicular joint     Plan: MRI of the left shoulder shows unfavorable acromial anatomy and severe AC joint arthritis.   Based on these findings I have recommended a left shoulder scope including extensive debridement, subacromial decompression and distal clavicle excision.  Risk benefits prognosis reviewed.  Austin Meadows will call the patient to confirm surgery date.  Follow-Up Instructions: No follow-ups on file.   Orders:  No orders of the defined types were placed in this encounter.  No orders of the defined types were placed in this encounter.     Procedures: No procedures performed   Clinical Data: No additional findings.   Subjective: Chief Complaint  Patient presents with   Left Shoulder - Follow-up    MRI review    HPI Austin Meadows is a 62 year old gentleman who is here to discuss recent left shoulder MRI scan. Review of Systems  Constitutional: Negative.   HENT: Negative.    Eyes: Negative.   Respiratory: Negative.    Cardiovascular: Negative.   Gastrointestinal: Negative.   Endocrine: Negative.   Genitourinary: Negative.   Musculoskeletal:  Positive for arthralgias.  Skin: Negative.   Allergic/Immunologic: Negative.   Neurological: Negative.   Hematological: Negative.   Psychiatric/Behavioral: Negative.    All other systems reviewed and are negative.    Objective: Vital Signs: There were no vitals taken for this visit.  Physical Exam Vitals and nursing note reviewed.  Constitutional:      Appearance: He is well-developed.  Pulmonary:     Effort: Pulmonary effort is  normal.  Abdominal:     Palpations: Abdomen is soft.  Skin:    General: Skin is warm.  Neurological:     Mental Status: He is alert and oriented to person, place, and time.  Psychiatric:        Behavior: Behavior normal.        Thought Content: Thought content normal.        Judgment: Judgment normal.     Ortho Exam Exam of the left shoulder shows pain with Hawkins sign.  Pain with palpation of the [facility] joint and with cross body adduction. Specialty Comments:  No specialty comments available.  Imaging: No results found.   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   Uncontrolled type 2 diabetes mellitus with hyperglycemia (HCC) 06/24/2014   Family history of colon cancer 06/09/2014   Past Medical History:  Diagnosis Date   Chicken pox    Diabetes mellitus without complication (HCC)    type 2   Hemorrhoid    Hyperlipidemia    Jaundice of newborn    Rheumatic fever    UTI (lower  urinary tract infection)     Family History  Problem Relation Age of Onset   Arthritis Mother    Cancer Mother        bone cancer   Colon cancer Father 42   Prostate cancer Father    Heart disease Father    Hypertension Father    Diabetes Father    Throat cancer Father    Colon cancer Brother 40       late 2's when dx   Cancer Brother        another cancer METS to brain   Colon cancer Brother 73   Colon cancer Other 70       Bill's daughter   Healthy Son        x 4   Healthy Daughter        x 1   Rectal cancer Neg Hx    Stomach cancer Neg Hx    Esophageal cancer Neg Hx     Past Surgical History:  Procedure  Laterality Date   COLONOSCOPY  ages [age]   done at [facility](pt unable to get records)   Social History   Occupational History   Occupation: Child psychotherapist  Tobacco Use   Smoking status: Former    Current packs/day: 0.00    Types: Cigarettes    Quit date: 06/04/1994    Years since quitting: 28.7   Smokeless tobacco: Never  Vaping Use   Vaping status: Never Used  Substance and Sexual Activity   Alcohol use: Not Currently    Alcohol/week: 0.0 standard drinks of alcohol    Comment: Occasionally (one drink per week).  Heavier drinking in the [employer] for ~ 8 years.    Drug use: No   Sexual activity: Not on file

## 2023-02-18 ENCOUNTER — Encounter: Payer: Self-pay | Admitting: Orthopaedic Surgery

## 2023-03-04 NOTE — Telephone Encounter (Signed)
Please see message. °

## 2023-03-08 ENCOUNTER — Other Ambulatory Visit: Payer: Self-pay | Admitting: Physician Assistant

## 2023-03-08 MED ORDER — ONDANSETRON HCL 4 MG PO TABS: 4.0000 mg | ORAL_TABLET | Freq: Three times a day (TID) | ORAL | 0 refills | Status: DC | PRN

## 2023-03-08 MED ORDER — HYDROCODONE-ACETAMINOPHEN 5-325 MG PO TABS: 1.0000 | ORAL_TABLET | Freq: Three times a day (TID) | ORAL | 0 refills | Status: DC | PRN

## 2023-03-14 ENCOUNTER — Encounter: Payer: Self-pay | Admitting: Orthopaedic Surgery

## 2023-03-14 DIAGNOSIS — M24112 Other articular cartilage disorders, left shoulder: Secondary | ICD-10-CM | POA: Diagnosis not present

## 2023-03-14 DIAGNOSIS — M19012 Primary osteoarthritis, left shoulder: Secondary | ICD-10-CM | POA: Diagnosis not present

## 2023-03-14 DIAGNOSIS — M7542 Impingement syndrome of left shoulder: Secondary | ICD-10-CM | POA: Diagnosis not present

## 2023-03-18 ENCOUNTER — Telehealth: Payer: Self-pay | Admitting: Orthopaedic Surgery

## 2023-03-18 ENCOUNTER — Encounter: Payer: Self-pay | Admitting: Family

## 2023-03-18 ENCOUNTER — Other Ambulatory Visit: Payer: Self-pay | Admitting: Physician Assistant

## 2023-03-18 ENCOUNTER — Encounter: Payer: Self-pay | Admitting: Orthopaedic Surgery

## 2023-03-18 MED ORDER — HYDROCODONE-ACETAMINOPHEN 5-325 MG PO TABS: 1.0000 | ORAL_TABLET | Freq: Three times a day (TID) | ORAL | 0 refills | Status: DC | PRN

## 2023-03-18 NOTE — Telephone Encounter (Signed)
Notified via voicemail

## 2023-03-18 NOTE — Telephone Encounter (Signed)
 Do you know if he is already scheduled for surgery?

## 2023-03-18 NOTE — Telephone Encounter (Signed)
 Pt called requesting a refill of hydrocodone. Pt states they only give him a 5 day supply. Please send to pharmacy on file. Pt phone number is [phone]

## 2023-03-18 NOTE — Telephone Encounter (Signed)
 Please have him look at instructions on the bottle.  He was given #20 pills one tid.  Should not be out by now.  I have refilled and given 7 days worth #21 pills

## 2023-03-19 ENCOUNTER — Telehealth: Payer: Self-pay | Admitting: Orthopaedic Surgery

## 2023-03-19 NOTE — Telephone Encounter (Signed)
 Went to [employer] and already has approval.

## 2023-03-19 NOTE — Telephone Encounter (Signed)
 Patient called and said that the pharmacy is waiting on authorization from you to refill his pain medication. CB#[phone]

## 2023-03-21 ENCOUNTER — Encounter: Payer: Self-pay | Admitting: Physician Assistant

## 2023-03-21 ENCOUNTER — Ambulatory Visit: Payer: 59 | Admitting: Physician Assistant

## 2023-03-21 DIAGNOSIS — Z9889 Other specified postprocedural states: Secondary | ICD-10-CM

## 2023-03-21 NOTE — Progress Notes (Signed)
Post-Op Visit Note   Patient: Austin Meadows           Date of Birth: 1960/09/29           MRN: [id number] Visit Date: 03/21/2023 PCP: Olive Bass, FNP   Assessment & Plan:  Chief Complaint:  Chief Complaint  Patient presents with   Left Shoulder - Pain   Visit Diagnoses:  1. History of arthroscopy of left shoulder     Plan: Patient is a pleasant 63 year old gentleman who comes in today 1 week status post left shoulder arthroscopic debridement decompression 03/14/2023.  He initially was in a fair amount of pain which was relieved with Norco.  Symptoms have improved.  Examination of the left shoulder reveals well-healing surgical portals with nylon sutures in place.  No evidence of infection or cellulitis.  Fingers warm well-perfused.  He is neurovascular tact distally.  Today, sutures were removed and Steri-Strips applied.  I sent in a referral for outpatient physical therapy.  He tells me he has enough medicine to get through the weekend.  He will follow-up with [name] in 5 weeks for recheck.  Call with concerns or questions.  Follow-Up Instructions: Return in about 5 weeks (around 04/25/2023).   Orders:  Orders Placed This Encounter  Procedures   Ambulatory referral to Physical Therapy   No orders of the defined types were placed in this encounter.   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   Uncontrolled type 2 diabetes mellitus with hyperglycemia (HCC) 06/24/2014   Family history of colon cancer 06/09/2014   Past Medical History:  Diagnosis Date   Chicken pox    Diabetes  mellitus without complication (HCC)    type 2   Hemorrhoid    Hyperlipidemia    Jaundice of newborn    Rheumatic fever    UTI (lower urinary tract infection)     Family History  Problem Relation Age of Onset   Arthritis Mother    Cancer Mother        bone cancer   Colon cancer Father 44   Prostate cancer Father    Heart disease Father    Hypertension Father    Diabetes Father    Throat cancer Father    Colon cancer Brother 5       late 66's when dx   Cancer Brother        another cancer METS to brain   Colon cancer Brother 58   Colon cancer Other 68       Bill's daughter   Healthy Son        x 4   Healthy Daughter        x 1   Rectal cancer Neg Hx    Stomach cancer Neg Hx    Esophageal cancer Neg Hx     Past Surgical History:  Procedure Laterality Date   COLONOSCOPY  ages [age]   done at [facility](pt unable to get records)   Social History   Occupational History   Occupation: Child psychotherapist  Tobacco Use   Smoking status: Former    Current packs/day: 0.00  Types: Cigarettes    Quit date: 06/04/1994    Years since quitting: 28.8   Smokeless tobacco: Never  Vaping Use   Vaping status: Never Used  Substance and Sexual Activity   Alcohol use: Not Currently    Alcohol/week: 0.0 standard drinks of alcohol    Comment: Occasionally (one drink per week).  Heavier drinking in the [employer] for ~ 8 years.    Drug use: No   Sexual activity: Not on file

## 2023-03-25 ENCOUNTER — Telehealth: Payer: Self-pay | Admitting: Orthopaedic Surgery

## 2023-03-25 ENCOUNTER — Ambulatory Visit (INDEPENDENT_AMBULATORY_CARE_PROVIDER_SITE_OTHER): Payer: 59 | Admitting: Physical Therapy

## 2023-03-25 ENCOUNTER — Encounter: Payer: Self-pay | Admitting: Physical Therapy

## 2023-03-25 DIAGNOSIS — R600 Localized edema: Secondary | ICD-10-CM | POA: Diagnosis not present

## 2023-03-25 DIAGNOSIS — M25512 Pain in left shoulder: Secondary | ICD-10-CM

## 2023-03-25 DIAGNOSIS — M6281 Muscle weakness (generalized): Secondary | ICD-10-CM | POA: Diagnosis not present

## 2023-03-25 DIAGNOSIS — M25612 Stiffness of left shoulder, not elsewhere classified: Secondary | ICD-10-CM | POA: Diagnosis not present

## 2023-03-25 NOTE — Therapy (Addendum)
OUTPATIENT PHYSICAL THERAPY SHOULDER EVALUATION   Patient Name: Austin Meadows MRN: [id number] DOB:Jul 22, 1960, 63 y.o., male Today's Date: 03/25/2023  END OF SESSION:  PT End of Session - 03/25/23 0924     Visit Number 1    Number of Visits 16    Date for PT Re-Evaluation 05/20/23    Authorization Type U[employer]    PT Start Time 0845    PT Stop Time 0920    PT Time Calculation (min) 35 min    Activity Tolerance Patient tolerated treatment well    Behavior During Therapy WFL for tasks assessed/performed             Past Medical History:  Diagnosis Date   Chicken pox    Diabetes mellitus without complication (HCC)    type 2   Hemorrhoid    Hyperlipidemia    Jaundice of newborn    Rheumatic fever    UTI (lower urinary tract infection)    Past Surgical History:  Procedure Laterality Date   COLONOSCOPY  ages [age]   done at [facility] and [facility](pt unable to get records)   Patient Active Problem List   Diagnosis Date Noted   Pain in right hip 09/28/2021   Gastroesophageal reflux disease 11/28/2018   Erectile dysfunction 12/12/2017   Hypercholesteremia 12/12/2017   Routine general medical examination at a health care facility 04/12/2017   Adhesive capsulitis of left shoulder 02/05/2017   CIDP (chronic inflammatory demyelinating polyneuropathy) (HCC) 04/11/2015   Uncontrolled type 2 diabetes mellitus with hyperglycemia (HCC) 06/24/2014   Family history of colon cancer 06/09/2014    PCP: Olive Bass, FNP   REFERRING PROVIDER: Cristie Hem, PA-C   REFERRING DIAG:   THERAPY DIAG:  Acute pain of left shoulder  Stiffness of left shoulder, not elsewhere classified  Muscle weakness (generalized)  Localized edema  Rationale for Evaluation and Treatment: Rehabilitation  ONSET DATE: status post left shoulder arthroscopic debridement decompression distal clavicle excision 03/14/2023  SUBJECTIVE:                                                                                                                                                                                       SUBJECTIVE STATEMENT: He denies any more numbness or tingling in his arm, overall pain is getting better post op. He does have difficulty sleeping and can not lay on his left side  Hand dominance: Right  PERTINENT HISTORY: Post op, DM, history of left frozen shoulder  PAIN:  NPRS scale: 2/10 upon arrival, can get up to 10 over last 5 days Pain location:Left shoulder anterior and lateral Pain description: constant, achy, sharp Aggravating factors:  picking up coffee, reaching Relieving factors: rest, meds   PRECAUTIONS: Shoulder post op DCE  RED FLAGS: None   WEIGHT BEARING RESTRICTIONS: No  FALLS:  Has patient fallen in last 6 months? 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  PLOF: Independent  PATIENT GOALS:regain as much motion and strength as he can  NEXT MD VISIT: nothing scheduled at eval  OBJECTIVE:  Note: Objective measures were completed at Evaluation unless otherwise noted.  PATIENT SURVEYS:  Eval: FOTO 36%functional, goal is 64%  COGNITION: Overall cognitive status: Within functional limits for tasks assessed     SENSATION: WFL   UPPER EXTREMITY ROM:   Active ROM/PROM Left eval Left eval  Shoulder flexion 50/120   Shoulder extension    Shoulder abduction 70/90   Shoulder adduction    Shoulder internal rotation 35/50   Shoulder external rotation 30/35   Elbow flexion    Elbow extension    Wrist flexion    Wrist extension    Wrist ulnar deviation    Wrist radial deviation    Wrist pronation    Wrist supination    (Blank rows = not tested)  UPPER EXTREMITY MMT:  MMT left eval   Shoulder flexion 2   Shoulder extension    Shoulder abduction 2   Shoulder adduction    Shoulder internal rotation 2   Shoulder external rotation 2   Middle trapezius    Lower  trapezius    Elbow flexion    Elbow extension    Wrist flexion    Wrist extension    Wrist ulnar deviation    Wrist radial deviation    Wrist pronation    Wrist supination    Grip strength (lbs)    (Blank rows = not tested  TODAY'S TREATMENT:  Eval HEP creation and review with demonstration and trial set preformed, see below for details Manual: Left shoulder PROM to tolerance all planes.   PATIENT EDUCATION: Education details: HEP, PT plan of care Person educated: Patient Education method: Explanation, Demonstration, Verbal cues, and Handouts Education comprehension: verbalized understanding and needs further education   HOME EXERCISE PROGRAM: Access Code: [email or web address] URL: [email or web address] Date: 03/25/2023 Prepared by: Ivery Quale  Exercises - Circular Shoulder Pendulum with Table Support  - 2 x daily - 7 x weekly - 1-2 sets - 10 reps - Supine Shoulder Flexion Extension AAROM with Dowel  - 2 x daily - 7 x weekly - 1 sets - 10 reps - Supine Shoulder External Rotation in 45 Degrees Abduction AAROM with Dowel  - 2 x daily - 7 x weekly - 1 sets - 10 reps - Supine Shoulder Abduction AAROM with Dowel  - 2 x daily - 6 x weekly - 1 sets - 10 reps - Standing Shoulder Scaption Wall Walk  - 3 x daily - 7 x weekly - 1 sets - 10 reps - 5 hold - Standing Shoulder Internal Rotation Stretch with Towel (Mirrored)  - 2 x daily - 6 x weekly - 1 sets - 10 reps - 5 sec hold  ASSESSMENT:  CLINICAL IMPRESSION: Patient referred to PT status post left shoulder arthroscopic debridement decompression distal clavicle excision 03/14/2023. Patient will benefit from skilled PT to address below impairments, limitations and improve overall function.  OBJECTIVE IMPAIRMENTS: decreased activity tolerance, decreased shoulder mobility, decreased ROM, decreased strength, impaired flexibility, impaired UE use, postural dysfunction, and pain.  ACTIVITY LIMITATIONS: reaching, lifting, carry,   cleaning, driving,  and  occupation  PERSONAL FACTORS:  DM, history or left frozen shoulder, also affecting patient's functional outcome.  REHAB POTENTIAL: Good  CLINICAL DECISION MAKING: Stable/uncomplicated  EVALUATION COMPLEXITY: Low    GOALS: Short term PT Goals Target date: 04/22/2023   Pt will be I and compliant with HEP. Baseline:  Goal status: New Pt will improve left shoulder PROM to [facility] Baseline: Goal status: New  Long term PT goals Target date:05/20/2023   Pt will improve Lt shoulder AROM to [facility] to improve functional reaching Baseline: Goal status: New Pt will improve  Lt shoulder strength to at least 4+/5 MMT to improve functional strength Baseline: Goal status: New Pt will improve FOTO to at least 64% functional to show improved function Baseline: Goal status: New Pt will reduce pain to overall less than 3/10 with usual activity and work activity. Baseline: Goal status: New  PLAN: PT FREQUENCY: 1-3 times per week   PT DURATION: 4-8 weeks  PLANNED INTERVENTIONS (unless contraindicated): aquatic PT, Canalith repositioning, cryotherapy, Electrical stimulation, Iontophoresis with 4 mg/ml dexamethasome, Moist heat, traction, Ultrasound, gait training, Therapeutic exercise, balance training, neuromuscular re-education, patient/family education,  manual techniques, passive ROM, dry needling, taping, vasopnuematic device, vestibular, spinal manipulations, joint manipulations 97110-Therapeutic exercises, 97530- Therapeutic activity, [id number]- Neuromuscular re-education, 97535- Self Care, and [id number]- Manual therapy  PLAN FOR NEXT SESSION: review HEP, AAROM focus initially working to restore full ROM                                                                                                                              April Manson, PT,DPT 03/25/2023, 9:25 AM

## 2023-03-25 NOTE — Telephone Encounter (Signed)
Received Guardian form and $25 cash. Auth on file. To [employer].

## 2023-03-26 ENCOUNTER — Other Ambulatory Visit: Payer: Self-pay | Admitting: Physician Assistant

## 2023-03-26 ENCOUNTER — Telehealth: Payer: Self-pay | Admitting: Orthopaedic Surgery

## 2023-03-26 MED ORDER — HYDROCODONE-ACETAMINOPHEN 5-325 MG PO TABS: 1.0000 | ORAL_TABLET | Freq: Two times a day (BID) | ORAL | 0 refills | Status: DC | PRN

## 2023-03-26 NOTE — Telephone Encounter (Signed)
Sent in earlier.  Replied to his mychart

## 2023-03-26 NOTE — Telephone Encounter (Signed)
Patient called needing Rx refilled Hydrocodone. The number to contact patient is [phone]

## 2023-04-03 ENCOUNTER — Ambulatory Visit (INDEPENDENT_AMBULATORY_CARE_PROVIDER_SITE_OTHER): Payer: 59 | Admitting: Physical Therapy

## 2023-04-03 ENCOUNTER — Other Ambulatory Visit: Payer: Self-pay

## 2023-04-03 ENCOUNTER — Telehealth: Payer: Self-pay | Admitting: Sports Medicine

## 2023-04-03 ENCOUNTER — Encounter: Payer: Self-pay | Admitting: Physical Therapy

## 2023-04-03 DIAGNOSIS — M6281 Muscle weakness (generalized): Secondary | ICD-10-CM

## 2023-04-03 DIAGNOSIS — M25512 Pain in left shoulder: Secondary | ICD-10-CM

## 2023-04-03 DIAGNOSIS — R600 Localized edema: Secondary | ICD-10-CM

## 2023-04-03 DIAGNOSIS — M25612 Stiffness of left shoulder, not elsewhere classified: Secondary | ICD-10-CM | POA: Diagnosis not present

## 2023-04-03 DIAGNOSIS — G8929 Other chronic pain: Secondary | ICD-10-CM

## 2023-04-03 NOTE — Therapy (Signed)
OUTPATIENT PHYSICAL THERAPY SHOULDER EVALUATION   Patient Name: Austin Meadows MRN: [id number] DOB:22-Nov-1960, 63 y.o., male Today's Date: 04/03/2023  END OF SESSION:  PT End of Session - 04/03/23 1033     Visit Number 2    Number of Visits 16    Date for PT Re-Evaluation 05/20/23    Authorization Type UHC    PT Start Time 1010    PT Stop Time 1048    PT Time Calculation (min) 38 min    Activity Tolerance Patient tolerated treatment well    Behavior During Therapy WFL for tasks assessed/performed              Past Medical History:  Diagnosis Date   Chicken pox    Diabetes mellitus without complication (HCC)    type 2   Hemorrhoid    Hyperlipidemia    Jaundice of newborn    Rheumatic fever    UTI (lower urinary tract infection)    Past Surgical History:  Procedure Laterality Date   COLONOSCOPY  ages [age]   done at [facility](pt unable to get records)   Patient Active Problem List   Diagnosis Date Noted   Pain in right hip 09/28/2021   Gastroesophageal reflux disease 11/28/2018   Erectile dysfunction 12/12/2017   Hypercholesteremia 12/12/2017   Routine general medical examination at a health care facility 04/12/2017   Adhesive capsulitis of left shoulder 02/05/2017   CIDP (chronic inflammatory demyelinating polyneuropathy) (HCC) 04/11/2015   Uncontrolled type 2 diabetes mellitus with hyperglycemia (HCC) 06/24/2014   Family history of colon cancer 06/09/2014    PCP: Olive Bass, FNP   REFERRING PROVIDER: Tarry Kos, MD   REFERRING DIAG:   THERAPY DIAG:  Acute pain of left shoulder  Stiffness of left shoulder, not elsewhere classified  Muscle weakness (generalized)  Localized edema  Rationale for Evaluation and Treatment: Rehabilitation  ONSET DATE: status post left shoulder arthroscopic debridement decompression distal clavicle excision 03/14/2023  SUBJECTIVE:                                                                                                                                                                                       SUBJECTIVE STATEMENT: He relays the pain has been worse lately, he has been trying to message MD about this but says my chart has down so unable to communicate  Hand dominance: Right  PERTINENT HISTORY: Post op, DM, history of left frozen shoulder  PAIN:  NPRS scale: 5/10 upon arrival, can get up to 10 over last 5 days Pain location:Left shoulder anterior and lateral Pain description: constant, achy, sharp Aggravating factors: picking up  coffee, reaching Relieving factors: rest, meds   PRECAUTIONS: Shoulder post op DCE  RED FLAGS: None   WEIGHT BEARING RESTRICTIONS: No  FALLS:  Has patient fallen in last 6 months? 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  PLOF: Independent  PATIENT GOALS:regain as much motion and strength as he can  NEXT MD VISIT: nothing scheduled at eval  OBJECTIVE:  Note: Objective measures were completed at Evaluation unless otherwise noted.  PATIENT SURVEYS:  Eval: FOTO 36%functional, goal is 64%  COGNITION: Overall cognitive status: Within functional limits for tasks assessed     SENSATION: WFL   UPPER EXTREMITY ROM:   Active ROM/PROM Left eval Left 04/03/23  Shoulder flexion 50/120 80/  Shoulder extension    Shoulder abduction 70/90 85/  Shoulder adduction    Shoulder internal rotation 35/50   Shoulder external rotation 30/35   Elbow flexion    Elbow extension    Wrist flexion    Wrist extension    Wrist ulnar deviation    Wrist radial deviation    Wrist pronation    Wrist supination    (Blank rows = not tested)  UPPER EXTREMITY MMT:  MMT left eval   Shoulder flexion 2   Shoulder extension    Shoulder abduction 2   Shoulder adduction    Shoulder internal rotation 2   Shoulder external rotation 2   Middle trapezius    Lower trapezius     Elbow flexion    Elbow extension    Wrist flexion    Wrist extension    Wrist ulnar deviation    Wrist radial deviation    Wrist pronation    Wrist supination    Grip strength (lbs)    (Blank rows = not tested  TODAY'S TREATMENT:  04/03/23 Left shoulder AAROM flexion 5 sec X 10 Shoulder abduction AAROM 5 sec X 10 Shoulder ER AAROM 5 sec X 10 Shoulder IR stretch supine AAROM 5 sec X 10 Pendulums X15 circles CW,CCW, horizontal abd/add, flexion/ext Pulleys 2 min flexion, 2 min abduction  Iontophoresis 4-6 hour wear home patch #1 with 1.0CC dexamethasone and saline to left lateral shoulder. Verbal instructions and education provided.     PATIENT EDUCATION: Education details: HEP, PT plan of care Person educated: Patient Education method: Explanation, Demonstration, Verbal cues, and Handouts Education comprehension: verbalized understanding and needs further education   HOME EXERCISE PROGRAM: Access Code: [email or web address] URL: [email or web address] Date: 03/25/2023 Prepared by: Ivery Quale  Exercises - Circular Shoulder Pendulum with Table Support  - 2 x daily - 7 x weekly - 1-2 sets - 10 reps - Supine Shoulder Flexion Extension AAROM with Dowel  - 2 x daily - 7 x weekly - 1 sets - 10 reps - Supine Shoulder External Rotation in 45 Degrees Abduction AAROM with Dowel  - 2 x daily - 7 x weekly - 1 sets - 10 reps - Supine Shoulder Abduction AAROM with Dowel  - 2 x daily - 6 x weekly - 1 sets - 10 reps - Standing Shoulder Scaption Wall Walk  - 3 x daily - 7 x weekly - 1 sets - 10 reps - 5 hold - Standing Shoulder Internal Rotation Stretch with Towel (Mirrored)  - 2 x daily - 6 x weekly - 1 sets - 10 reps - 5 sec hold  ASSESSMENT:  CLINICAL IMPRESSION: His ROM has improved but still with some limitations as well as has been having increased pain  lately. He will try to get appointment set up with MD for injection. I did try iontophoresis patch today to see if this will help  with any of his pain.   OBJECTIVE IMPAIRMENTS: decreased activity tolerance, decreased shoulder mobility, decreased ROM, decreased strength, impaired flexibility, impaired UE use, postural dysfunction, and pain.  ACTIVITY LIMITATIONS: reaching, lifting, carry,  cleaning, driving, and  occupation  PERSONAL FACTORS:  DM, history or left frozen shoulder, also affecting patient's functional outcome.  REHAB POTENTIAL: Good  CLINICAL DECISION MAKING: Stable/uncomplicated  EVALUATION COMPLEXITY: Low    GOALS: Short term PT Goals Target date: 04/22/2023   Pt will be I and compliant with HEP. Baseline:  Goal status: New Pt will improve left shoulder PROM to [facility] Baseline: Goal status: New  Long term PT goals Target date:05/20/2023   Pt will improve Lt shoulder AROM to [facility] to improve functional reaching Baseline: Goal status: New Pt will improve  Lt shoulder strength to at least 4+/5 MMT to improve functional strength Baseline: Goal status: New Pt will improve FOTO to at least 64% functional to show improved function Baseline: Goal status: New Pt will reduce pain to overall less than 3/10 with usual activity and work activity. Baseline: Goal status: New  PLAN: PT FREQUENCY: 1-3 times per week   PT DURATION: 4-8 weeks  PLANNED INTERVENTIONS (unless contraindicated): aquatic PT, Canalith repositioning, cryotherapy, Electrical stimulation, Iontophoresis with 4 mg/ml dexamethasome, Moist heat, traction, Ultrasound, gait training, Therapeutic exercise, balance training, neuromuscular re-education, patient/family education,  manual techniques, passive ROM, dry needling, taping, vasopnuematic device, vestibular, spinal manipulations, joint manipulations 97110-Therapeutic exercises, 97530- Therapeutic activity, 97112- Neuromuscular re-education, 97535- Self Care, and 02725- Manual therapy  PLAN FOR NEXT SESSION:  AAROM focus initially working to restore full ROM, how was ionto  patch?                                                                                                                              April Manson, PT,DPT 04/03/2023, 10:47 AM

## 2023-04-03 NOTE — Telephone Encounter (Signed)
Yes left shoulder glenohumeral injection.  Thanks.

## 2023-04-03 NOTE — Telephone Encounter (Signed)
Patient would like another injection in his shoulder.

## 2023-04-04 ENCOUNTER — Ambulatory Visit: Payer: 59 | Admitting: Physical Therapy

## 2023-04-04 ENCOUNTER — Encounter: Payer: Self-pay | Admitting: Physical Therapy

## 2023-04-04 DIAGNOSIS — M25612 Stiffness of left shoulder, not elsewhere classified: Secondary | ICD-10-CM | POA: Diagnosis not present

## 2023-04-04 DIAGNOSIS — R600 Localized edema: Secondary | ICD-10-CM | POA: Diagnosis not present

## 2023-04-04 DIAGNOSIS — M25512 Pain in left shoulder: Secondary | ICD-10-CM

## 2023-04-04 DIAGNOSIS — M6281 Muscle weakness (generalized): Secondary | ICD-10-CM | POA: Diagnosis not present

## 2023-04-04 NOTE — Telephone Encounter (Signed)
Left message for patient to call office to schedule appointment. Okay to schedule in a 15 minute spot on Friday 04/05/23 or Monday 04/08/23 for left GH injection.

## 2023-04-04 NOTE — Therapy (Signed)
OUTPATIENT PHYSICAL THERAPY SHOULDER EVALUATION   Patient Name: Austin Meadows MRN: [id number] DOB:November 28, 1960, 63 y.o., male Today's Date: 04/04/2023  END OF SESSION:  PT End of Session - 04/04/23 0843     Visit Number 3    Number of Visits 16    Date for PT Re-Evaluation 05/20/23    Authorization Type U[employer]    PT Start Time [phone]    PT Stop Time 0921    PT Time Calculation (min) 38 min    Activity Tolerance Patient tolerated treatment well    Behavior During Therapy [facility] for tasks assessed/performed              Past Medical History:  Diagnosis Date   Chicken pox    Diabetes mellitus without complication (HCC)    type 2   Hemorrhoid    Hyperlipidemia    Jaundice of newborn    Rheumatic fever    UTI (lower urinary tract infection)    Past Surgical History:  Procedure Laterality Date   COLONOSCOPY  ages [age]   done at [facility] and [facility](pt unable to get records)   Patient Active Problem List   Diagnosis Date Noted   Pain in right hip 09/28/2021   Gastroesophageal reflux disease 11/28/2018   Erectile dysfunction 12/12/2017   Hypercholesteremia 12/12/2017   Routine general medical examination at a health care facility 04/12/2017   Adhesive capsulitis of left shoulder 02/05/2017   CIDP (chronic inflammatory demyelinating polyneuropathy) (HCC) 04/11/2015   Uncontrolled type 2 diabetes mellitus with hyperglycemia (HCC) 06/24/2014   Family history of colon cancer 06/09/2014    PCP: Olive Bass, FNP   REFERRING PROVIDER: Tarry Kos, MD   REFERRING DIAG:   THERAPY DIAG:  Acute pain of left shoulder  Stiffness of left shoulder, not elsewhere classified  Muscle weakness (generalized)  Localized edema  Rationale for Evaluation and Treatment: Rehabilitation  ONSET DATE: status post left shoulder arthroscopic debridement decompression distal clavicle excision 03/14/2023  SUBJECTIVE:                                                                                                                                                                                       SUBJECTIVE STATEMENT: He feels some improvement for about 5-6 hours from ionto patch last time.   Hand dominance: Right  PERTINENT HISTORY: Post op, DM, history of left frozen shoulder  PAIN:  NPRS scale: 5/10 upon arrival, can get up to 10 over last 5 days Pain location:Left shoulder anterior and lateral Pain description: constant, achy, sharp Aggravating factors: picking up coffee, reaching Relieving factors: rest, meds   PRECAUTIONS: Shoulder post op DCE  RED FLAGS: None   WEIGHT BEARING RESTRICTIONS: No  FALLS:  Has patient fallen in last 6 months? 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  PLOF: Independent  PATIENT GOALS:regain as much motion and strength as he can  NEXT MD VISIT: nothing scheduled at eval  OBJECTIVE:  Note: Objective measures were completed at Evaluation unless otherwise noted.  PATIENT SURVEYS:  Eval: FOTO 36%functional, goal is 64%  COGNITION: Overall cognitive status: Within functional limits for tasks assessed     SENSATION: WFL   UPPER EXTREMITY ROM:   Active ROM/PROM Left eval Left 04/03/23  Shoulder flexion 50/120 80/  Shoulder extension    Shoulder abduction 70/90 85/  Shoulder adduction    Shoulder internal rotation 35/50   Shoulder external rotation 30/35   Elbow flexion    Elbow extension    Wrist flexion    Wrist extension    Wrist ulnar deviation    Wrist radial deviation    Wrist pronation    Wrist supination    (Blank rows = not tested)  UPPER EXTREMITY MMT:  MMT left eval   Shoulder flexion 2   Shoulder extension    Shoulder abduction 2   Shoulder adduction    Shoulder internal rotation 2   Shoulder external rotation 2   Middle trapezius    Lower trapezius    Elbow flexion    Elbow extension    Wrist flexion    Wrist  extension    Wrist ulnar deviation    Wrist radial deviation    Wrist pronation    Wrist supination    Grip strength (lbs)    (Blank rows = not tested  TODAY'S TREATMENT:  04/04/23 UBE L3, 3 min forward, 3 min retro Pulleys 2 min flexion, 2 min abduction Wall ladder scaption, 5 sec X 10 UE ranger X 10 each for flexion, abd, circles in flexion CW, CCW Rows green 2X15 Shoulder extensions green 2X10 Supine Left shoulder AAROM flexion 5 sec X 15 Supine Shoulder abduction AAROM 5 sec X 15 Supine Shoulder ER AAROM 5 sec X 15 Shoulder IR stretch supine AAROM 5 sec X 15 Pendulums X15 circles CW,CCW, horizontal abd/add, flexion/ext  Iontophoresis 4-6 hour wear home patch #2 with 1.0CC dexamethasone and saline to left lateral shoulder. Verbal instructions and education provided.   04/03/23 Left shoulder AAROM flexion 5 sec X 10 Shoulder abduction AAROM 5 sec X 10 Shoulder ER AAROM 5 sec X 10 Shoulder IR stretch supine AAROM 5 sec X 10 Pendulums X15 circles CW,CCW, horizontal abd/add, flexion/ext Pulleys 2 min flexion, 2 min abduction  Iontophoresis 4-6 hour wear home patch #1 with 1.0CC dexamethasone and saline to left lateral shoulder. Verbal instructions and education provided.     PATIENT EDUCATION: Education details: HEP, PT plan of care Person educated: Patient Education method: Explanation, Demonstration, Verbal cues, and Handouts Education comprehension: verbalized understanding and needs further education   HOME EXERCISE PROGRAM: Access Code: [email or web address] URL: [email or web address] Date: 03/25/2023 Prepared by: Ivery Quale  Exercises - Circular Shoulder Pendulum with Table Support  - 2 x daily - 7 x weekly - 1-2 sets - 10 reps - Supine Shoulder Flexion Extension AAROM with Dowel  - 2 x daily - 7 x weekly - 1 sets - 10 reps - Supine Shoulder External Rotation in 45 Degrees Abduction AAROM with Dowel  - 2 x daily - 7 x weekly - 1 sets - 10 reps - Supine  Shoulder Abduction AAROM with Dowel  - 2 x daily - 6 x weekly - 1 sets - 10 reps - Standing Shoulder Scaption Wall Walk  - 3 x daily - 7 x weekly - 1 sets - 10 reps - 5 hold - Standing Shoulder Internal Rotation Stretch with Towel (Mirrored)  - 2 x daily - 6 x weekly - 1 sets - 10 reps - 5 sec hold  ASSESSMENT:  CLINICAL IMPRESSION: Continued to focus on gaining ROM as tolerated. Continued with iontophoresis treatment since this helped with some of the pain last time  OBJECTIVE IMPAIRMENTS: decreased activity tolerance, decreased shoulder mobility, decreased ROM, decreased strength, impaired flexibility, impaired UE use, postural dysfunction, and pain.  ACTIVITY LIMITATIONS: reaching, lifting, carry,  cleaning, driving, and  occupation  PERSONAL FACTORS:  DM, history or left frozen shoulder, also affecting patient's functional outcome.  REHAB POTENTIAL: Good  CLINICAL DECISION MAKING: Stable/uncomplicated  EVALUATION COMPLEXITY: Low    GOALS: Short term PT Goals Target date: 04/22/2023   Pt will be I and compliant with HEP. Baseline:  Goal status: New Pt will improve left shoulder PROM to [facility] Baseline: Goal status: New  Long term PT goals Target date:05/20/2023   Pt will improve Lt shoulder AROM to [facility] to improve functional reaching Baseline: Goal status: New Pt will improve  Lt shoulder strength to at least 4+/5 MMT to improve functional strength Baseline: Goal status: New Pt will improve FOTO to at least 64% functional to show improved function Baseline: Goal status: New Pt will reduce pain to overall less than 3/10 with usual activity and work activity. Baseline: Goal status: New  PLAN: PT FREQUENCY: 1-3 times per week   PT DURATION: 4-8 weeks  PLANNED INTERVENTIONS (unless contraindicated): aquatic PT, Canalith repositioning, cryotherapy, Electrical stimulation, Iontophoresis with 4 mg/ml dexamethasome, Moist heat, traction, Ultrasound, gait training,  Therapeutic exercise, balance training, neuromuscular re-education, patient/family education,  manual techniques, passive ROM, dry needling, taping, vasopnuematic device, vestibular, spinal manipulations, joint manipulations 97110-Therapeutic exercises, 97530- Therapeutic activity, 97112- Neuromuscular re-education, 97535- Self Care, and 16109- Manual therapy  PLAN FOR NEXT SESSION:  AAROM focus initially working to restore full ROM, ionto if desired, consider DN.                                                                                                                              April Manson, PT,DPT 04/04/2023, 8:43 AM

## 2023-04-05 ENCOUNTER — Other Ambulatory Visit: Payer: Self-pay | Admitting: Orthopaedic Surgery

## 2023-04-05 MED ORDER — HYDROCODONE-ACETAMINOPHEN 5-325 MG PO TABS: 1.0000 | ORAL_TABLET | Freq: Two times a day (BID) | ORAL | 0 refills | Status: DC | PRN

## 2023-04-08 ENCOUNTER — Encounter: Payer: Self-pay | Admitting: Physical Therapy

## 2023-04-08 ENCOUNTER — Ambulatory Visit (INDEPENDENT_AMBULATORY_CARE_PROVIDER_SITE_OTHER): Payer: 59 | Admitting: Physical Therapy

## 2023-04-08 DIAGNOSIS — M25512 Pain in left shoulder: Secondary | ICD-10-CM | POA: Diagnosis not present

## 2023-04-08 DIAGNOSIS — R600 Localized edema: Secondary | ICD-10-CM

## 2023-04-08 DIAGNOSIS — M25612 Stiffness of left shoulder, not elsewhere classified: Secondary | ICD-10-CM

## 2023-04-08 DIAGNOSIS — M6281 Muscle weakness (generalized): Secondary | ICD-10-CM

## 2023-04-08 NOTE — Therapy (Signed)
OUTPATIENT PHYSICAL THERAPY SHOULDER TREATMENT   Patient Name: Austin Meadows MRN: [id number] DOB:15-May-1960, 63 y.o., male Today's Date: 04/08/2023  END OF SESSION:  PT End of Session - 04/08/23 0957     Visit Number 4    Number of Visits 16    Date for PT Re-Evaluation 05/20/23    Authorization Type UHC    PT Start Time 0957    PT Stop Time 1035    PT Time Calculation (min) 38 min    Activity Tolerance Patient tolerated treatment well    Behavior During Therapy WFL for tasks assessed/performed              Past Medical History:  Diagnosis Date   Chicken pox    Diabetes mellitus without complication (HCC)    type 2   Hemorrhoid    Hyperlipidemia    Jaundice of newborn    Rheumatic fever    UTI (lower urinary tract infection)    Past Surgical History:  Procedure Laterality Date   COLONOSCOPY  ages [age]   done at [facility](pt unable to get records)   Patient Active Problem List   Diagnosis Date Noted   Pain in right hip 09/28/2021   Gastroesophageal reflux disease 11/28/2018   Erectile dysfunction 12/12/2017   Hypercholesteremia 12/12/2017   Routine general medical examination at a health care facility 04/12/2017   Adhesive capsulitis of left shoulder 02/05/2017   CIDP (chronic inflammatory demyelinating polyneuropathy) (HCC) 04/11/2015   Uncontrolled type 2 diabetes mellitus with hyperglycemia (HCC) 06/24/2014   Family history of colon cancer 06/09/2014    PCP: Olive Bass, FNP   REFERRING PROVIDER: Tarry Kos, MD   REFERRING DIAG:   THERAPY DIAG:  Acute pain of left shoulder  Stiffness of left shoulder, not elsewhere classified  Muscle weakness (generalized)  Localized edema  Rationale for Evaluation and Treatment: Rehabilitation  ONSET DATE: status post left shoulder arthroscopic debridement decompression distal clavicle excision 03/14/2023  SUBJECTIVE:                                                                                                                                                                                       SUBJECTIVE STATEMENT: He started having some pain in anterior Rt shoulder last night, not sure why  Hand dominance: Right  PERTINENT HISTORY: Post op, DM, history of left frozen shoulder  PAIN:  NPRS scale: 5/10 upon arrival Pain location:Left shoulder anterior and lateral Pain description: constant, achy, sharp Aggravating factors: picking up coffee, reaching Relieving factors: rest, meds   PRECAUTIONS: Shoulder post op DCE  RED FLAGS: None   WEIGHT BEARING RESTRICTIONS:  No  FALLS:  Has patient fallen in last 6 months? 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  PLOF: Independent  PATIENT GOALS:regain as much motion and strength as he can  NEXT MD VISIT: nothing scheduled at eval  OBJECTIVE:  Note: Objective measures were completed at Evaluation unless otherwise noted.  PATIENT SURVEYS:  Eval: FOTO 36%functional, goal is 64%  COGNITION: Overall cognitive status: Within functional limits for tasks assessed     SENSATION: [facility]   UPPER EXTREMITY ROM:   Active ROM/PROM Left eval Left 04/03/23 Left 04/08/23  Shoulder flexion 50/120 80/ /150  Shoulder extension     Shoulder abduction 70/90 85/ /125  Shoulder adduction     Shoulder internal rotation 35/50    Shoulder external rotation 30/35    Elbow flexion     Elbow extension     Wrist flexion     Wrist extension     Wrist ulnar deviation     Wrist radial deviation     Wrist pronation     Wrist supination     (Blank rows = not tested)  UPPER EXTREMITY MMT:  MMT left eval   Shoulder flexion 2   Shoulder extension    Shoulder abduction 2   Shoulder adduction    Shoulder internal rotation 2   Shoulder external rotation 2   Middle trapezius    Lower trapezius    Elbow flexion    Elbow extension    Wrist flexion    Wrist  extension    Wrist ulnar deviation    Wrist radial deviation    Wrist pronation    Wrist supination    Grip strength (lbs)    (Blank rows = not tested  TODAY'S TREATMENT:  04/08/23 Therex Pulleys 2 min flexion, 2 min abduction Wall ladder scaption, 5 sec X 10 UE ranger X 10 each for flexion ROM Supine Left shoulder AAROM flexion 5 sec X 10 Supine Shoulder abduction AAROM 5 sec X 10 Supine Shoulder ER AAROM 5 sec X 10 Shoulder IR stretch supine AAROM 5 sec X 10  Thereactivity  ROM and strength for push/pull/carry/lifting) Row machine 25# 2X15 Lat pull machine 35# 2X15 Chest press machine 25# 2X15  Manual therapy performed by Ivery Quale, PT,DPT for active compression and skilled palpation withTrigger Point Dry Needling Initial Treatment: Pt instructed on Dry Needling rational, procedures, and possible side effects. Pt instructed to expect mild to moderate muscle soreness later in the day and/or into the next day.  Pt instructed in methods to reduce muscle soreness. Pt instructed to continue prescribed HEP. Patient verbalized understanding of these instructions and education.  Patient Verbal Consent Given: Yes Education Handout Provided: Yes Muscles Treated: left deltoids Electrical Stimulation Performed: No Yes used micro amp current at 100 frequency to level tolerated as well as milli amp current at frequency 2 to level tolerated Treatment Response/Outcome:  good overall tolerance,twitch response noted      .   04/04/23 UBE L3, 3 min forward, 3 min retro Pulleys 2 min flexion, 2 min abduction Wall ladder scaption, 5 sec X 10 UE ranger X 10 each for flexion, abd, circles in flexion CW, CCW Rows green 2X15 Shoulder extensions green 2X10 Supine Left shoulder AAROM flexion 5 sec X 15 Supine Shoulder abduction AAROM 5 sec X 15 Supine Shoulder ER AAROM 5 sec X 15 Shoulder IR stretch supine AAROM 5 sec X 15 Pendulums X15 circles CW,CCW, horizontal abd/add,  flexion/ext  Iontophoresis 4-6 hour wear home patch #2 with 1.0CC dexamethasone and saline to left lateral shoulder. Verbal instructions and education provided.   04/03/23 Left shoulder AAROM flexion 5 sec X 10 Shoulder abduction AAROM 5 sec X 10 Shoulder ER AAROM 5 sec X 10 Shoulder IR stretch supine AAROM 5 sec X 10 Pendulums X15 circles CW,CCW, horizontal abd/add, flexion/ext Pulleys 2 min flexion, 2 min abduction  Iontophoresis 4-6 hour wear home patch #1 with 1.0CC dexamethasone and saline to left lateral shoulder. Verbal instructions and education provided.     PATIENT EDUCATION: Education details: HEP, PT plan of care Person educated: Patient Education method: Explanation, Demonstration, Verbal cues, and Handouts Education comprehension: verbalized understanding and needs further education   HOME EXERCISE PROGRAM: Access Code: [email or web address] URL: [email or web address] Date: 03/25/2023 Prepared by: Ivery Quale  Exercises - Circular Shoulder Pendulum with Table Support  - 2 x daily - 7 x weekly - 1-2 sets - 10 reps - Supine Shoulder Flexion Extension AAROM with Dowel  - 2 x daily - 7 x weekly - 1 sets - 10 reps - Supine Shoulder External Rotation in 45 Degrees Abduction AAROM with Dowel  - 2 x daily - 7 x weekly - 1 sets - 10 reps - Supine Shoulder Abduction AAROM with Dowel  - 2 x daily - 6 x weekly - 1 sets - 10 reps - Standing Shoulder Scaption Wall Walk  - 3 x daily - 7 x weekly - 1 sets - 10 reps - 5 hold - Standing Shoulder Internal Rotation Stretch with Towel (Mirrored)  - 2 x daily - 6 x weekly - 1 sets - 10 reps - 5 sec hold  ASSESSMENT:  CLINICAL IMPRESSION: His strength is improving and he wants to start back at gym so we did go over gym equipment that he can use at light weight that would be safe and help improve functional strength and ROM of his left shoulder. We are also continuing to focus on restoring his full ROM of left shoulder. I did try DN  today to left deltoids to see if this helps release the shoulder to improve ROM and pain. I opted to try this instead of ionto and we will assess which one worked better for him at next visit.  OBJECTIVE IMPAIRMENTS: decreased activity tolerance, decreased shoulder mobility, decreased ROM, decreased strength, impaired flexibility, impaired UE use, postural dysfunction, and pain.  ACTIVITY LIMITATIONS: reaching, lifting, carry,  cleaning, driving, and  occupation  PERSONAL FACTORS:  DM, history or left frozen shoulder, also affecting patient's functional outcome.  REHAB POTENTIAL: Good  CLINICAL DECISION MAKING: Stable/uncomplicated  EVALUATION COMPLEXITY: Low    GOALS: Short term PT Goals Target date: 04/22/2023   Pt will be I and compliant with HEP. Baseline:  Goal status: MET 04/08/23 Pt will improve left shoulder PROM to [facility] Baseline: Goal status: ongoing 04/08/23  Long term PT goals Target date:05/20/2023   Pt will improve Lt shoulder AROM to [facility] to improve functional reaching Baseline: Goal status: ongoing 04/08/23 Pt will improve  Lt shoulder strength to at least 4+/5 MMT to improve functional strength Baseline: Goal status: ongoing 04/08/23 Pt will improve FOTO to at least 64% functional to show improved function Baseline: Goal status: ongoing 04/08/23 Pt will reduce pain to overall less than 3/10 with usual activity and work activity. Baseline: Goal status: ongoing 04/08/23  PLAN: PT FREQUENCY: 1-3 times per week   PT DURATION: 4-8 weeks  PLANNED INTERVENTIONS (unless contraindicated): aquatic PT,  Canalith repositioning, cryotherapy, Electrical stimulation, Iontophoresis with 4 mg/ml dexamethasome, Moist heat, traction, Ultrasound, gait training, Therapeutic exercise, balance training, neuromuscular re-education, patient/family education,  manual techniques, passive ROM, dry needling, taping, vasopnuematic device, vestibular, spinal manipulations, joint  manipulations 97110-Therapeutic exercises, 97530- Therapeutic activity, [id number]- Neuromuscular re-education, 97535- Self Care, and 16109- Manual therapy  PLAN FOR NEXT SESSION:   How was DN vs ionto? AAROM focus initially working to restore full ROM, strengthening and gym equipment as tolerated.                                                                                                                              April Manson, PT,DPT 04/08/2023, 9:57 AM

## 2023-04-08 NOTE — Patient Instructions (Signed)

## 2023-04-10 ENCOUNTER — Encounter: Payer: Self-pay | Admitting: Physical Therapy

## 2023-04-10 ENCOUNTER — Ambulatory Visit (INDEPENDENT_AMBULATORY_CARE_PROVIDER_SITE_OTHER): Payer: 59 | Admitting: Physical Therapy

## 2023-04-10 DIAGNOSIS — M25612 Stiffness of left shoulder, not elsewhere classified: Secondary | ICD-10-CM | POA: Diagnosis not present

## 2023-04-10 DIAGNOSIS — M6281 Muscle weakness (generalized): Secondary | ICD-10-CM | POA: Diagnosis not present

## 2023-04-10 DIAGNOSIS — M25512 Pain in left shoulder: Secondary | ICD-10-CM

## 2023-04-10 DIAGNOSIS — R600 Localized edema: Secondary | ICD-10-CM | POA: Diagnosis not present

## 2023-04-10 NOTE — Therapy (Addendum)
 OUTPATIENT PHYSICAL THERAPY SHOULDER TREATMENT/Discharge addendum PHYSICAL THERAPY DISCHARGE SUMMARY  Visits from Start of Care: 5  Current functional level related to goals / functional outcomes: See below   Remaining deficits: See below   Education / Equipment: HEP  Plan:  Patient goals were some met. Patient is being discharged at his request due to financial restrictions as he relays insurance is not covering his visits. He will continue with HEP.  Redell Moose, PT, DPT 04/16/23 8:30 AM       Patient Name: Austin Meadows MRN: [id number] DOB:01-Dec-1960, 63 y.o., male Today's Date: 04/10/2023  END OF SESSION:  PT End of Session - 04/10/23 0806     Visit Number 5    Number of Visits 16    Date for PT Re-Evaluation 05/20/23    Authorization Type [employer]    PT Start Time 0800    PT Stop Time 0845    PT Time Calculation (min) 45 min    Activity Tolerance Patient tolerated treatment well    Behavior During Therapy WFL for tasks assessed/performed              Past Medical History:  Diagnosis Date   Chicken pox    Diabetes mellitus without complication (HCC)    type 2   Hemorrhoid    Hyperlipidemia    Jaundice of newborn    Rheumatic fever    UTI (lower urinary tract infection)    Past Surgical History:  Procedure Laterality Date   COLONOSCOPY  ages [age]   done at [facility](pt unable to get records)   Patient Active Problem List   Diagnosis Date Noted   Pain in right hip 09/28/2021   Gastroesophageal reflux disease 11/28/2018   Erectile dysfunction 12/12/2017   Hypercholesteremia 12/12/2017   Routine general medical examination at a health care facility 04/12/2017   Adhesive capsulitis of left shoulder 02/05/2017   CIDP (chronic inflammatory demyelinating polyneuropathy) (HCC) 04/11/2015   Uncontrolled type 2 diabetes mellitus with hyperglycemia (HCC) 06/24/2014   Family history of colon cancer 06/09/2014    PCP: Jason Leita Repine, FNP   REFERRING PROVIDER: Jule Ronal CROME, PA-C   REFERRING DIAG:   THERAPY DIAG:  Acute pain of left shoulder  Stiffness of left shoulder, not elsewhere classified  Muscle weakness (generalized)  Localized edema  Rationale for Evaluation and Treatment: Rehabilitation  ONSET DATE: status post left shoulder arthroscopic debridement decompression distal clavicle excision 03/14/2023  SUBJECTIVE:  SUBJECTIVE STATEMENT: He says shoulder was sore after DN but then it got better  Hand dominance: Right  PERTINENT HISTORY: Post op, DM, history of left frozen shoulder  PAIN:  NPRS scale: 2/10 upon arrival Pain location:Left shoulder anterior and lateral Pain description: constant, achy, sharp Aggravating factors: picking up coffee, reaching Relieving factors: rest, meds   PRECAUTIONS: Shoulder post op DCE  RED FLAGS: None   WEIGHT BEARING RESTRICTIONS: No  FALLS:  Has patient fallen in last 6 months? 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  PLOF: Independent  PATIENT GOALS:regain as much motion and strength as he can  NEXT MD VISIT: nothing scheduled at eval  OBJECTIVE:  Note: Objective measures were completed at Evaluation unless otherwise noted.  PATIENT SURVEYS:  Eval: FOTO 36%functional, goal is 64%  COGNITION: Overall cognitive status: Within functional limits for tasks assessed     SENSATION: [facility]   UPPER EXTREMITY ROM:   Active ROM/PROM Left eval Left 04/03/23 Left 04/08/23  Shoulder flexion 50/120 80/ /150  Shoulder extension     Shoulder abduction 70/90 85/ /125  Shoulder adduction     Shoulder internal rotation 35/50    Shoulder external rotation 30/35    Elbow flexion     Elbow extension     Wrist flexion      Wrist extension     Wrist ulnar deviation     Wrist radial deviation     Wrist pronation     Wrist supination     (Blank rows = not tested)  UPPER EXTREMITY MMT:  MMT left eval   Shoulder flexion 2   Shoulder extension    Shoulder abduction 2   Shoulder adduction    Shoulder internal rotation 2   Shoulder external rotation 2   Middle trapezius    Lower trapezius    Elbow flexion    Elbow extension    Wrist flexion    Wrist extension    Wrist ulnar deviation    Wrist radial deviation    Wrist pronation    Wrist supination    Grip strength (lbs)    (Blank rows = not tested  TODAY'S TREATMENT:  04/10/23 Therex UBE 3 min forward, 3 min reverse Pulleys 2 min flexion, 2 min abduction Wall ladder scaption, 5 sec X 10 UE ranger X 10 each for flexion ROM Standing Left shoulder AAROM flexion 5 sec X 10 (bar vertical for better stretch) Standing Shoulder abduction AAROM 5 sec X 10 Standing Shoulder ER AAROM 5 sec X 10 Standing shoulder IR AAROM behind back X 10   Thereactivity  (ROM and strength for push/pull/carry/lifting) Row machine 25# 2X15 Lat pull machine 35# 2X15 Chest press machine 25# 2X15 Shoulder ER green 2X10 Shoulder IR green 2X10  modalities Iontophoresis 4-6 hour wear home patch #3 with 1.0CC dexamethasone and saline to left lateral shoulder. Verbal instructions and education provided.   04/08/23 Therex Pulleys 2 min flexion, 2 min abduction Wall ladder scaption, 5 sec X 10 UE ranger X 10 each for flexion ROM Supine Left shoulder AAROM flexion 5 sec X 10 Supine Shoulder abduction AAROM 5 sec X 10 Supine Shoulder ER AAROM 5 sec X 10 Shoulder IR stretch supine AAROM 5 sec X 10  Thereactivity  ROM and strength for push/pull/carry/lifting) Row machine 25# 2X15 Lat pull machine 35# 2X15 Chest press machine 25# 2X15  Manual therapy performed by Redell Moose, PT,DPT for active compression and skilled  palpation withTrigger Point Dry Needling Initial  Treatment: Pt instructed on Dry Needling rational, procedures, and possible side effects. Pt instructed to expect mild to moderate muscle soreness later in the day and/or into the next day.  Pt instructed in methods to reduce muscle soreness. Pt instructed to continue prescribed HEP. Patient verbalized understanding of these instructions and education.  Patient Verbal Consent Given: Yes Education Handout Provided: Yes Muscles Treated: left deltoids Electrical Stimulation Performed: No Yes used micro amp current at 100 frequency to level tolerated as well as milli amp current at frequency 2 to level tolerated Treatment Response/Outcome:  good overall tolerance,twitch response noted      PATIENT EDUCATION: Education details: HEP, PT plan of care Person educated: Patient Education method: Explanation, Demonstration, Verbal cues, and Handouts Education comprehension: verbalized understanding and needs further education   HOME EXERCISE PROGRAM: Access Code: [email or web address] URL: [email or web address] Date: 03/25/2023 Prepared by: Redell Moose  Exercises - Circular Shoulder Pendulum with Table Support  - 2 x daily - 7 x weekly - 1-2 sets - 10 reps - Supine Shoulder Flexion Extension AAROM with Dowel  - 2 x daily - 7 x weekly - 1 sets - 10 reps - Supine Shoulder External Rotation in 45 Degrees Abduction AAROM with Dowel  - 2 x daily - 7 x weekly - 1 sets - 10 reps - Supine Shoulder Abduction AAROM with Dowel  - 2 x daily - 6 x weekly - 1 sets - 10 reps - Standing Shoulder Scaption Wall Walk  - 3 x daily - 7 x weekly - 1 sets - 10 reps - 5 hold - Standing Shoulder Internal Rotation Stretch with Towel (Mirrored)  - 2 x daily - 6 x weekly - 1 sets - 10 reps - 5 sec hold  ASSESSMENT:  CLINICAL IMPRESSION: His pain was better today after the soreness wore off from DN the previous session. He opted for inoto vs DN today to limit soreness after his exercise program.   OBJECTIVE  IMPAIRMENTS: decreased activity tolerance, decreased shoulder mobility, decreased ROM, decreased strength, impaired flexibility, impaired UE use, postural dysfunction, and pain.  ACTIVITY LIMITATIONS: reaching, lifting, carry,  cleaning, driving, and  occupation  PERSONAL FACTORS:  DM, history or left frozen shoulder, also affecting patient's functional outcome.  REHAB POTENTIAL: Good  CLINICAL DECISION MAKING: Stable/uncomplicated  EVALUATION COMPLEXITY: Low    GOALS: Short term PT Goals Target date: 04/22/2023   Pt will be I and compliant with HEP. Baseline:  Goal status: MET 04/08/23 Pt will improve left shoulder PROM to [facility] Baseline: Goal status: ongoing 04/08/23  Long term PT goals Target date:05/20/2023   Pt will improve Lt shoulder AROM to [facility] to improve functional reaching Baseline: Goal status: ongoing 04/08/23 Pt will improve  Lt shoulder strength to at least 4+/5 MMT to improve functional strength Baseline: Goal status: ongoing 04/08/23 Pt will improve FOTO to at least 64% functional to show improved function Baseline: Goal status: ongoing 04/08/23 Pt will reduce pain to overall less than 3/10 with usual activity and work activity. Baseline: Goal status: ongoing 04/08/23  PLAN: PT FREQUENCY: 1-3 times per week   PT DURATION: 4-8 weeks  PLANNED INTERVENTIONS (unless contraindicated): aquatic PT, Canalith repositioning, cryotherapy, Electrical stimulation, Iontophoresis with 4 mg/ml dexamethasome, Moist heat, traction, Ultrasound, gait training, Therapeutic exercise, balance training, neuromuscular re-education, patient/family education,  manual techniques, passive ROM, dry needling, taping, vasopnuematic device, vestibular, spinal manipulations, joint manipulations 97110-Therapeutic exercises, 97530- Therapeutic activity, [id number]- Neuromuscular re-education, 97535- Self Care,  and 02859- Manual therapy  PLAN FOR NEXT SESSION:   AAROM focus initially working to restore full  ROM, strengthening and gym equipment as tolerated. DN or ionto if desired                                                                                                                              Redell JONELLE Moose, PT,DPT 04/10/2023, 8:50 AM

## 2023-04-12 ENCOUNTER — Encounter: Payer: Self-pay | Admitting: Physical Therapy

## 2023-04-15 ENCOUNTER — Encounter: Payer: 59 | Admitting: Physical Therapy

## 2023-04-17 ENCOUNTER — Encounter: Payer: 59 | Admitting: Physical Therapy

## 2023-04-22 ENCOUNTER — Encounter: Payer: 59 | Admitting: Physical Therapy

## 2023-04-24 ENCOUNTER — Encounter: Payer: 59 | Admitting: Physical Therapy

## 2023-04-25 ENCOUNTER — Ambulatory Visit: Payer: 59 | Admitting: Sports Medicine

## 2023-05-03 ENCOUNTER — Other Ambulatory Visit: Payer: Self-pay | Admitting: Orthopaedic Surgery

## 2023-05-03 MED ORDER — HYDROCODONE-ACETAMINOPHEN 5-325 MG PO TABS: 1.0000 | ORAL_TABLET | Freq: Two times a day (BID) | ORAL | 0 refills | Status: DC | PRN

## 2023-05-09 ENCOUNTER — Encounter: Payer: Self-pay | Admitting: Sports Medicine

## 2023-05-09 ENCOUNTER — Other Ambulatory Visit: Payer: Self-pay

## 2023-05-09 ENCOUNTER — Ambulatory Visit: Payer: 59 | Admitting: Sports Medicine

## 2023-05-09 DIAGNOSIS — Z9889 Other specified postprocedural states: Secondary | ICD-10-CM

## 2023-05-09 DIAGNOSIS — E1169 Type 2 diabetes mellitus with other specified complication: Secondary | ICD-10-CM

## 2023-05-09 DIAGNOSIS — G8929 Other chronic pain: Secondary | ICD-10-CM

## 2023-05-09 DIAGNOSIS — M25512 Pain in left shoulder: Secondary | ICD-10-CM | POA: Diagnosis not present

## 2023-05-09 MED ORDER — BUPIVACAINE HCL 0.25 % IJ SOLN
2.0000 mL | INTRAMUSCULAR | Status: AC | PRN
Start: 2023-05-09 — End: 2023-05-09
  Administered 2023-05-09: 2 mL via INTRA_ARTICULAR

## 2023-05-09 MED ORDER — LIDOCAINE HCL 1 % IJ SOLN
2.0000 mL | INTRAMUSCULAR | Status: AC | PRN
Start: 2023-05-09 — End: 2023-05-09
  Administered 2023-05-09: 2 mL

## 2023-05-09 MED ORDER — METHYLPREDNISOLONE ACETATE 40 MG/ML IJ SUSP
40.0000 mg | INTRAMUSCULAR | Status: AC | PRN
Start: 2023-05-09 — End: 2023-05-09
  Administered 2023-05-09: 40 mg via INTRA_ARTICULAR
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
## 2023-05-22 NOTE — Progress Notes (Unsigned)
   Post-Op Visit Note   Patient: Austin Meadows           Date of Birth: 01-19-1961           MRN: [id number] Visit Date: 05/23/2023 PCP: Olive Bass, FNP   Assessment & Plan:  Chief Complaint: No chief complaint on file. Visit Diagnoses: No diagnosis found.  Plan: ***  Follow-Up Instructions: No follow-ups on file.   Orders:  No orders of the defined types were placed in this encounter. No orders of the defined types were placed in this encounter.  Imaging: No results found.  PMFS History: Patient Active Problem List   Diagnosis Date Noted  . Pain in right hip 09/28/2021  . Gastroesophageal reflux disease 11/28/2018  . Erectile dysfunction 12/12/2017  . Hypercholesteremia 12/12/2017  . Routine general medical examination at a health care facility 04/12/2017  . 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 Uncontrolled type 2 diabetes mellitus with hyperglycemia (HCC) 06/24/2014  . Family history of colon cancer 06/09/2014   Past Medical History:  Diagnosis Date  . Chicken pox   . Diabetes mellitus without complication (HCC)    type 2  . Hemorrhoid   . Hyperlipidemia   . Jaundice of newborn   . Rheumatic fever   . UTI (lower urinary tract infection)     Family History  Problem Relation Age of Onset  . Arthritis Mother   . Cancer Mother        bone cancer  . Colon cancer Father 58  . Prostate cancer Father   . Heart disease Father   . Hypertension Father   . Diabetes Father   . Throat cancer Father   . Colon cancer Brother 13       late 41's when dx  . Cancer Brother        another cancer METS to brain  . Colon cancer Brother 16  . Colon cancer Other 28       Bill's daughter  . Healthy Son        x 4  . Healthy Daughter        x 1  . Rectal cancer Neg Hx   . Stomach cancer Neg Hx   . Esophageal cancer Neg Hx     Past Surgical History:  Procedure Laterality  Date  . COLONOSCOPY  ages [age]   done at [facility] to get records)   Social History   Occupational History  . Occupation: Child psychotherapist  Tobacco Use  . Smoking status: Former    Current packs/day: 0.00    Types: Cigarettes    Quit date: 06/04/1994    Years since quitting: 28.9  . Smokeless tobacco: Never  Vaping Use  . Vaping status: Never Used  Substance and Sexual Activity  . Alcohol use: Not Currently    Alcohol/week: 0.0 standard drinks of alcohol    Comment: Occasionally (one drink per week).  Heavier drinking in the [employer] for ~ 8 years.   . Drug use: No  . Sexual activity: Not on file

## 2023-05-23 ENCOUNTER — Ambulatory Visit: Admitting: Orthopaedic Surgery

## 2023-05-23 DIAGNOSIS — M7502 Adhesive capsulitis of left shoulder: Secondary | ICD-10-CM

## 2023-05-27 NOTE — Progress Notes (Unsigned)
 Office Visit Note   Patient: Austin Meadows           Date of Birth: 07-16-1960           MRN: [id number] Visit Date: 05/28/2023              Requested by:  Olive Bass, FNP [address] [place],  [place] [address]  PCP: Olive Bass, FNP   Assessment & Plan: Visit Diagnoses: No diagnosis found.  Plan: ***  Follow-Up Instructions: No follow-ups on file.   Orders:  No orders of the defined types were placed in this encounter. No orders of the defined types were placed in this encounter.    Procedures: No procedures performed   Clinical Data: No additional findings.   Subjective: No chief complaint on file.  HPI  Review of Systems   Objective: Vital Signs: There were no vitals taken for this visit.  Physical Exam  Ortho Exam  Specialty Comments:  No specialty comments available.  Imaging: No results found.   PMFS History: Patient Active Problem List   Diagnosis Date Noted  . Pain in right hip 09/28/2021  . Gastroesophageal reflux disease 11/28/2018  . Erectile dysfunction 12/12/2017  . Hypercholesteremia 12/12/2017  . Routine general medical examination at a health care facility 04/12/2017  . 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 Uncontrolled type 2 diabetes mellitus with hyperglycemia (HCC) 06/24/2014  . Family history of colon cancer 06/09/2014   Past Medical History:  Diagnosis Date  . Chicken pox   . Diabetes mellitus without complication (HCC)    type 2  . Hemorrhoid   . Hyperlipidemia   . Jaundice of newborn   . Rheumatic fever   . UTI (lower urinary tract infection)     Family History  Problem Relation Age of Onset  . Arthritis Mother   . Cancer Mother        bone cancer  . Colon cancer Father 22  . Prostate cancer Father   . Heart disease Father   . Hypertension Father   . Diabetes Father   . Throat  cancer Father   . Colon cancer Brother 67       late 44's when dx  . Cancer Brother        another cancer METS to brain  . Colon cancer Brother 66  . Colon cancer Other 28       Bill's daughter  . Healthy Son        x 4  . Healthy Daughter        x 1  . Rectal cancer Neg Hx   . Stomach cancer Neg Hx   . Esophageal cancer Neg Hx     Past Surgical History:  Procedure Laterality Date  . COLONOSCOPY  ages [age]   done at [facility](pt [facility] to get records)   Social History   Occupational History  . Occupation: Child psychotherapist  Tobacco Use  . Smoking status: Former    Current packs/day: 0.00    Types: Cigarettes    Quit date: 06/04/1994    Years since quitting: 28.9  . Smokeless tobacco: Never  Vaping Use  . Vaping status: Never Used  Substance and Sexual Activity  . Alcohol use: Not Currently    Alcohol/week: 0.0 standard drinks of alcohol    Comment: Occasionally (one drink per week).  Heavier drinking in  the [employer] for ~ 8 years.   . Drug use: No  . Sexual activity: Not on file

## 2023-05-28 ENCOUNTER — Encounter: Payer: Self-pay | Admitting: Orthopaedic Surgery

## 2023-05-28 ENCOUNTER — Ambulatory Visit (INDEPENDENT_AMBULATORY_CARE_PROVIDER_SITE_OTHER): Admitting: Orthopaedic Surgery

## 2023-05-28 ENCOUNTER — Other Ambulatory Visit (INDEPENDENT_AMBULATORY_CARE_PROVIDER_SITE_OTHER)

## 2023-05-28 DIAGNOSIS — G8929 Other chronic pain: Secondary | ICD-10-CM

## 2023-05-28 DIAGNOSIS — M25561 Pain in right knee: Secondary | ICD-10-CM

## 2023-05-28 MED ORDER — LIDOCAINE HCL 1 % IJ SOLN
2.0000 mL | INTRAMUSCULAR | Status: AC | PRN
Start: 2023-05-28 — End: 2023-05-28
  Administered 2023-05-28: 2 mL

## 2023-05-28 MED ORDER — BUPIVACAINE HCL 0.5 % IJ SOLN
2.0000 mL | INTRAMUSCULAR | Status: AC | PRN
Start: 2023-05-28 — End: 2023-05-28
  Administered 2023-05-28: 2 mL via INTRA_ARTICULAR

## 2023-05-28 MED ORDER — METHYLPREDNISOLONE ACETATE 40 MG/ML IJ SUSP
40.0000 mg | INTRAMUSCULAR | Status: AC | PRN
Start: 2023-05-28 — End: 2023-05-28
  Administered 2023-05-28: 40 mg via INTRA_ARTICULAR

## 2023-06-10 ENCOUNTER — Telehealth: Payer: Self-pay | Admitting: Orthopaedic Surgery

## 2023-06-10 ENCOUNTER — Encounter: Payer: Self-pay | Admitting: Orthopaedic Surgery

## 2023-06-10 NOTE — Telephone Encounter (Signed)
 Note has been sent to Mychart

## 2023-06-10 NOTE — Telephone Encounter (Signed)
 Pt called requesting a return to work with no restrictions Please send to pt's mychart. Pt phone number is [phone].

## 2023-07-23 ENCOUNTER — Encounter: Payer: Self-pay | Admitting: Orthopaedic Surgery

## 2023-07-23 ENCOUNTER — Ambulatory Visit (INDEPENDENT_AMBULATORY_CARE_PROVIDER_SITE_OTHER): Admitting: Orthopaedic Surgery

## 2023-07-23 DIAGNOSIS — G8929 Other chronic pain: Secondary | ICD-10-CM | POA: Diagnosis not present

## 2023-07-23 DIAGNOSIS — M25561 Pain in right knee: Secondary | ICD-10-CM | POA: Diagnosis not present

## 2023-07-23 NOTE — Progress Notes (Signed)
 Office Visit Note   Patient: Austin Meadows           Date of Birth: 1960-10-08           MRN: [id number] Visit Date: 07/23/2023              Requested by: Adra Alanis, FNP [address] [place],  [place] [address] PCP: Adra Alanis, FNP   Assessment & Plan: Visit Diagnoses:  1. Chronic pain of right knee     Plan: History of Present Illness Austin Meadows is a 63 year old male who presents with right knee pain.  He has experienced persistent pain on the medial aspect of his right knee for approximately two weeks, with swelling beginning about a week ago. A cortisone injection on March 25th provided temporary relief.  Physical Exam MUSCULOSKELETAL: Tenderness along the medial joint line of the right knee.  Normal motion.  No joint effusion.  Results RADIOLOGY Knee X-ray: No significant arthritis or structural abnormalities (05/28/2023)  Assessment and Plan Right knee pain with possible meniscus tear Right knee pain localized to medial joint line, suspect meniscus tear. X-rays negative for arthritis or structural issues. MRI needed for confirmation.  - Order MRI of right knee to evaluate meniscus tear. - Schedule MRI appointment upon imaging center call.  Follow-Up Instructions: No follow-ups on file.   Orders:  Orders Placed This Encounter  Procedures   MR Knee Right w/o contrast   No orders of the defined types were placed in this encounter.   Subjective: Chief Complaint  Patient presents with   Right Knee - Follow-up    HPI  Review of Systems  Constitutional: Negative.   HENT: Negative.    Eyes: Negative.   Respiratory: Negative.    Cardiovascular: Negative.   Gastrointestinal: Negative.   Endocrine: Negative.   Genitourinary: Negative.   Skin: Negative.   Allergic/Immunologic: Negative.   Neurological: Negative.   Hematological: Negative.   Psychiatric/Behavioral: Negative.    All other systems reviewed and are  negative.    Objective: Vital Signs: There were no vitals taken for this visit.  Physical Exam Vitals and nursing note reviewed.  Constitutional:      Appearance: He is well-developed.  HENT:     Head: Normocephalic and atraumatic.  Eyes:     Pupils: Pupils are equal, round, and reactive to light.  Pulmonary:     Effort: Pulmonary effort is normal.  Abdominal:     Palpations: Abdomen is soft.  Musculoskeletal:        General: Normal range of motion.     Cervical back: Neck supple.  Skin:    General: Skin is warm.  Neurological:     Mental Status: He is alert and oriented to person, place, and time.  Psychiatric:        Behavior: Behavior normal.        Thought Content: Thought content normal.        Judgment: Judgment normal.      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   Uncontrolled type 2 diabetes mellitus with hyperglycemia (HCC) 06/24/2014   Family history of colon cancer  06/09/2014   Past Medical History:  Diagnosis Date   Chicken pox    Diabetes mellitus without complication (HCC)    type 2   Hemorrhoid    Hyperlipidemia    Jaundice of newborn    Rheumatic fever    UTI (lower urinary tract infection)     Family History  Problem Relation Age of Onset   Arthritis Mother    Cancer Mother        bone cancer   Colon cancer Father 3   Prostate cancer Father    Heart disease Father    Hypertension Father    Diabetes Father    Throat cancer Father    Colon cancer Brother 110       late 75's when dx   Cancer Brother        another cancer METS to brain   Colon cancer Brother 68   Colon  cancer Other 16       Bill's daughter   Healthy Son        x 4   Healthy Daughter        x 1   Rectal cancer Neg Hx    Stomach cancer Neg Hx    Esophageal cancer Neg Hx     Past Surgical History:  Procedure Laterality Date   COLONOSCOPY  ages [age]   done at [facility] and [facility](pt unable to get records)   Social History   Occupational History   Occupation: Child psychotherapist  Tobacco Use   Smoking status: Former    Current packs/day: 0.00    Types: Cigarettes    Quit date: 06/04/1994    Years since quitting: 29.1   Smokeless tobacco: Never  Vaping Use   Vaping status: Never Used  Substance and Sexual Activity   Alcohol use: Not Currently    Alcohol/week: 0.0 standard drinks of alcohol    Comment: Occasionally (one drink per week).  Heavier drinking in the [employer] for ~ 8 years.    Drug use: No   Sexual activity: Not on file

## 2023-07-24 ENCOUNTER — Encounter: Payer: Self-pay | Admitting: Orthopaedic Surgery

## 2023-07-30 ENCOUNTER — Ambulatory Visit
Admission: RE | Admit: 2023-07-30 | Discharge: 2023-07-30 | Disposition: A | Discharge status: Home / Self Care | Source: Ambulatory Visit | Attending: Orthopaedic Surgery | Admitting: Orthopaedic Surgery

## 2023-07-30 DIAGNOSIS — G8929 Other chronic pain: Secondary | ICD-10-CM

## 2023-07-31 ENCOUNTER — Encounter: Payer: Self-pay | Admitting: Orthopaedic Surgery

## 2023-08-08 DIAGNOSIS — M84461A Pathological fracture, right tibia, initial encounter for fracture: Secondary | ICD-10-CM | POA: Insufficient documentation

## 2023-08-08 DIAGNOSIS — S83241A Other tear of medial meniscus, current injury, right knee, initial encounter: Secondary | ICD-10-CM | POA: Insufficient documentation

## 2023-08-09 ENCOUNTER — Encounter: Payer: Self-pay | Admitting: Orthopaedic Surgery

## 2023-08-09 ENCOUNTER — Ambulatory Visit: Admitting: Orthopaedic Surgery

## 2023-08-09 DIAGNOSIS — S83241A Other tear of medial meniscus, current injury, right knee, initial encounter: Secondary | ICD-10-CM

## 2023-08-09 DIAGNOSIS — M84461A Pathological fracture, right tibia, initial encounter for fracture: Secondary | ICD-10-CM

## 2023-08-09 NOTE — Progress Notes (Signed)
 Office Visit Note   Patient: Austin Meadows           Date of Birth: 03-Sep-1960           MRN: [id number] Visit Date: 08/09/2023              Requested by: Adra Alanis, FNP [address] [place],  [place] [address] PCP: Adra Alanis, FNP   Assessment & Plan: Visit Diagnoses:  1. Acute medial meniscus tear of right knee, initial encounter   2. Insufficiency fracture of right tibia     Plan: History of Present Illness Austin Meadows is a 63 year old male who presents with worsening right knee pain and MRI review.  The pain has progressively worsened, significantly affecting his daily activities.   Assessment and Plan Torn medial meniscus, right knee.  Insufficiency fracture medial tibial plateau. MRI confirmed complex tear of the right medial meniscus causing tibial stress reaction and knee effusion. - Recommend knee arthroscopy and subchondroplasty - detailed surgical plan discussed including risks, benefits, prognosis - conservative treatments including PT, injections, activity modifications have not been effective.  Follow-Up Instructions: No follow-ups on file.    Subjective: Chief Complaint  Patient presents with   Right Knee - Follow-up    MRI review    HPI  Review of Systems  Constitutional: Negative.   HENT: Negative.    Eyes: Negative.   Respiratory: Negative.    Cardiovascular: Negative.   Gastrointestinal: Negative.   Endocrine: Negative.   Genitourinary: Negative.   Skin: Negative.   Allergic/Immunologic: Negative.   Neurological: Negative.   Hematological: Negative.   Psychiatric/Behavioral: Negative.    All other systems reviewed and are negative.    Objective: Vital Signs: There were no vitals taken for this visit.  Physical Exam Vitals and nursing note reviewed.  Constitutional:      Appearance: He is well-developed.  Pulmonary:     Effort: Pulmonary effort is normal.  Abdominal:     Palpations: Abdomen  is soft.  Skin:    General: Skin is warm.  Neurological:     Mental Status: He is alert and oriented to person, place, and time.  Psychiatric:        Behavior: Behavior normal.        Thought Content: Thought content normal.        Judgment: Judgment normal.     Ortho Exam  Specialty Comments:  No specialty comments available.  Imaging: No results found.   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   Uncontrolled type 2 diabetes mellitus with hyperglycemia (HCC) 06/24/2014   Family history of colon cancer 06/09/2014   Past Medical History:  Diagnosis Date   Chicken pox    Diabetes mellitus without complication (HCC)    type 2   Hemorrhoid    Hyperlipidemia    Jaundice of newborn    Rheumatic fever    UTI (lower urinary tract infection)     Family History  Problem Relation Age of Onset   Arthritis Mother  Cancer Mother        bone cancer   Colon cancer Father 28   Prostate cancer Father    Heart disease Father    Hypertension Father    Diabetes Father    Throat cancer Father    Colon cancer Brother 4       late 53's when dx   Cancer Brother        another cancer METS to brain   Colon cancer Brother 70   Colon cancer Other 46       Bill's daughter   Healthy Son        x 4   Healthy Daughter        x 1   Rectal cancer Neg Hx    Stomach cancer Neg Hx    Esophageal cancer Neg Hx     Past Surgical History:  Procedure Laterality Date   COLONOSCOPY  ages [age]   done at [facility]  and [facility](pt unable to get records)   Social History   Occupational History   Occupation: Child psychotherapist  Tobacco Use   Smoking status: Former    Current packs/day: 0.00    Types: Cigarettes    Quit date: 06/04/1994    Years since quitting: 29.2   Smokeless tobacco: Never  Vaping Use   Vaping status: Never Used  Substance and Sexual Activity   Alcohol use: Not Currently    Alcohol/week: 0.0 standard drinks of alcohol    Comment: Occasionally (one drink per week).  Heavier drinking in the [employer] for ~ 8 years.    Drug use: No   Sexual activity: Not on file

## 2023-08-23 ENCOUNTER — Telehealth: Payer: Self-pay | Admitting: Orthopaedic Surgery

## 2023-08-23 NOTE — Telephone Encounter (Signed)
 Received vm from patient. IC,lmvm advised pt to bring his form to the office and complete auth and pay $20 for fee.

## 2023-08-28 ENCOUNTER — Other Ambulatory Visit: Payer: Self-pay | Admitting: Physician Assistant

## 2023-08-28 MED ORDER — ONDANSETRON HCL 4 MG PO TABS: 4.0000 mg | ORAL_TABLET | Freq: Three times a day (TID) | ORAL | 0 refills | Status: DC | PRN

## 2023-08-28 MED ORDER — HYDROCODONE-ACETAMINOPHEN 5-325 MG PO TABS: 1.0000 | ORAL_TABLET | Freq: Three times a day (TID) | ORAL | 0 refills | Status: DC | PRN

## 2023-08-29 ENCOUNTER — Encounter: Payer: Self-pay | Admitting: Family

## 2023-08-29 ENCOUNTER — Encounter: Payer: Self-pay | Admitting: Orthopaedic Surgery

## 2023-08-29 DIAGNOSIS — M84461A Pathological fracture, right tibia, initial encounter for fracture: Secondary | ICD-10-CM | POA: Diagnosis not present

## 2023-08-29 DIAGNOSIS — S83231A Complex tear of medial meniscus, current injury, right knee, initial encounter: Secondary | ICD-10-CM | POA: Diagnosis not present

## 2023-08-29 DIAGNOSIS — M94261 Chondromalacia, right knee: Secondary | ICD-10-CM | POA: Diagnosis not present

## 2023-08-30 ENCOUNTER — Other Ambulatory Visit: Payer: Self-pay | Admitting: Family

## 2023-09-03 ENCOUNTER — Telehealth: Payer: Self-pay

## 2023-09-03 ENCOUNTER — Ambulatory Visit (INDEPENDENT_AMBULATORY_CARE_PROVIDER_SITE_OTHER): Admitting: Family

## 2023-09-03 ENCOUNTER — Ambulatory Visit: Payer: Self-pay | Admitting: Family

## 2023-09-03 ENCOUNTER — Other Ambulatory Visit: Payer: Self-pay | Admitting: Family

## 2023-09-03 ENCOUNTER — Encounter: Payer: Self-pay | Admitting: Family

## 2023-09-03 ENCOUNTER — Other Ambulatory Visit (HOSPITAL_COMMUNITY): Payer: Self-pay

## 2023-09-03 VITALS — BP 124/76 | HR 71 | Ht 72.0 in | Wt 192.6 lb

## 2023-09-03 DIAGNOSIS — Z7984 Long term (current) use of oral hypoglycemic drugs: Secondary | ICD-10-CM | POA: Diagnosis not present

## 2023-09-03 DIAGNOSIS — E1165 Type 2 diabetes mellitus with hyperglycemia: Secondary | ICD-10-CM

## 2023-09-03 DIAGNOSIS — B351 Tinea unguium: Secondary | ICD-10-CM

## 2023-09-03 LAB — COMPREHENSIVE METABOLIC PANEL WITH GFR
ALT: 17 U/L (ref 0–53)
AST: 11 U/L (ref 0–37)
Albumin: 4.4 g/dL (ref 3.5–5.2)
Alkaline Phosphatase: 74 U/L (ref 39–117)
BUN: 22 mg/dL (ref 6–23)
CO2: 29 meq/L (ref 19–32)
Calcium: 9.4 mg/dL (ref 8.4–10.5)
Chloride: 96 meq/L (ref 96–112)
Creatinine, Ser: 0.94 mg/dL (ref 0.40–1.50)
GFR: 87.87 mL/min (ref >60.00)
Glucose, Bld: 419 mg/dL — ABNORMAL HIGH (ref 70–99)
Potassium: 4.5 meq/L (ref 3.5–5.1)
Sodium: 132 meq/L — ABNORMAL LOW (ref 135–145)
Total Bilirubin: 0.6 mg/dL (ref 0.2–1.2)
Total Protein: 6.8 g/dL (ref 6.0–8.3)

## 2023-09-03 LAB — CBC WITH DIFFERENTIAL/PLATELET
Basophils Absolute: 0.1 10*3/uL (ref 0.0–0.1)
Basophils Relative: 1 % (ref 0.0–3.0)
Eosinophils Absolute: 0.2 10*3/uL (ref 0.0–0.7)
Eosinophils Relative: 3.2 % (ref 0.0–5.0)
HCT: 46.3 % (ref 39.0–52.0)
Hemoglobin: 15.3 g/dL (ref 13.0–17.0)
Lymphocytes Relative: 22.5 % (ref 12.0–46.0)
Lymphs Abs: 1.3 10*3/uL (ref 0.7–4.0)
MCHC: 33 g/dL (ref 30.0–36.0)
MCV: 82.9 fl (ref 78.0–100.0)
Monocytes Absolute: 0.6 10*3/uL (ref 0.1–1.0)
Monocytes Relative: 9.6 % (ref 3.0–12.0)
Neutro Abs: 3.7 10*3/uL (ref 1.4–7.7)
Neutrophils Relative %: 63.7 % (ref 43.0–77.0)
Platelets: 218 10*3/uL (ref 150.0–400.0)
RBC: 5.59 Mil/uL (ref 4.22–5.81)
RDW: 13.3 % (ref 11.5–15.5)
WBC: 5.8 10*3/uL (ref 4.0–10.5)

## 2023-09-03 LAB — HEMOGLOBIN A1C: Hgb A1c MFr Bld: 14.3 % — ABNORMAL HIGH (ref 4.6–6.5)

## 2023-09-03 MED ORDER — TERBINAFINE HCL 250 MG PO TABS: 250.0000 mg | ORAL_TABLET | Freq: Every day | ORAL | 1 refills | Status: DC

## 2023-09-03 MED ORDER — TRESIBA FLEXTOUCH 100 UNIT/ML ~~LOC~~ SOPN
PEN_INJECTOR | SUBCUTANEOUS | 1 refills | Status: DC
Start: 2023-09-03 — End: 2023-12-26

## 2023-09-03 MED ORDER — RYBELSUS 3 MG PO TABS: 3.0000 mg | ORAL_TABLET | Freq: Every day | ORAL | 0 refills | Status: DC

## 2023-09-03 MED ORDER — INSULIN PEN NEEDLE 31G X 5 MM MISC
0 refills | Status: DC
Start: 2023-09-03 — End: 2023-11-05

## 2023-09-03 NOTE — Progress Notes (Signed)
 Austin Meadows is a 63 y.o. 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 90 tablet 3   Semaglutide (RYBELSUS) 3 MG TABS Take 1 tablet (3 mg total) by mouth daily. 30 tablet 0   terbinafine (LAMISIL) 250 MG tablet Take 1 tablet (250 mg total) by mouth daily. 30 tablet 1   benzonatate  (TESSALON ) 200 MG capsule Take 1 capsule (200 mg total) by mouth 3 (three) times daily as needed for cough. (Patient not taking: Reported on 09/03/2023) 20 capsule 0   fluticasone  (FLONASE ) 50 MCG/ACT nasal spray Place 2 sprays into both nostrils daily. (Patient not taking: Reported on 09/03/2023) 16 g 6   HYDROcodone -acetaminophen  (NORCO/VICODIN) 5-325 MG tablet Take 1 tablet by mouth 3 (three) times daily as needed for moderate pain (pain score 4-6). (Patient not taking: Reported on 09/03/2023) 30 tablet 0   ibuprofen  (ADVIL ) 600 MG tablet Take 1 tablet (600 mg total) by mouth every 6 (six) hours as needed. (Patient not taking: Reported on 09/03/2023) 60 tablet 6   lidocaine  (LIDODERM ) 5 % Place 1 patch onto the skin daily. Remove & Discard patch within 12 hours or as directed by MD (Patient not taking: Reported on 09/03/2023) 30 patch 0   ondansetron   (ZOFRAN ) 4 MG tablet Take 1 tablet (4 mg total) by mouth every 8 (eight) hours as needed for nausea or vomiting. (Patient not taking: Reported on 09/03/2023) 40 tablet 0   ondansetron  (ZOFRAN ) 4 MG tablet Take 1 tablet (4 mg total) by mouth every 8 (eight) hours as needed for nausea or vomiting. (Patient not taking: Reported on 09/03/2023) 40 tablet 0   pantoprazole  (PROTONIX ) 40 MG tablet Take 1 tablet (40 mg total) by mouth daily. (Patient not taking: Reported on 09/03/2023) 90 tablet 3   PROCTOFOAM  HC rectal foam PLACE 1 APPLICATORFUL RECTALLY  THREE TIMES DAILY (Patient not taking: Reported on 09/03/2023) 10 g 0   No current facility-administered medications for this visit.    Allergies: Influenza vaccines, Metformin  and related, and Penicillins  Past Medical History:  Diagnosis Date   Chicken pox    Diabetes mellitus without complication (HCC)    type 2   Hemorrhoid    Hyperlipidemia    Jaundice of newborn    Rheumatic fever    UTI (lower urinary tract infection)     Past Surgical History:  Procedure Laterality Date   COLONOSCOPY  ages [age]   [facility](pt unable to get records)    Family History  Problem Relation Age of Onset   Arthritis Mother    Cancer Mother        bone cancer   Colon cancer Father 63   Prostate cancer Father    Heart disease Father  Hypertension Father    Diabetes Father    Throat cancer Father    Colon cancer Brother 76       late 81's when dx   Cancer Brother        another cancer METS to brain   Colon cancer Brother 40   Colon cancer Other 5       Bill's daughter   Healthy Son        x 4   Healthy Daughter        x 1   Rectal cancer Neg Hx    Stomach cancer Neg Hx    Esophageal cancer Neg Hx     Social History   Tobacco Use   Smoking status: Former    Current packs/day: 0.00    Types: Cigarettes    Quit date: 06/04/1994    Years since quitting: 29.2   Smokeless tobacco: Never  Substance Use Topics    Alcohol use: Not Currently    Alcohol/week: 0.0 standard drinks of alcohol    Comment: Occasionally (one drink per week).  Heavier drinking in the [employer] for ~ 8 years.     Subjective:   Follow up on Type 2 Diabetes- was due for OV in February 2025 and unfortunately was not able to make that appointment; called last week with concerns that he had an elevated blood sugar reading of 268- admits that he did eat a lot of white potatoes the day prior; has lost 17 pounds since he was last seen here in August 2024; Notes he would prefer not to use an injection;   Also concerned about nail fungus on his toenails bilaterally- would like to try medication;   Objective:  Vitals:   09/03/23 0857  BP: 124/76  Pulse: 71  SpO2: 98%  Weight: 192 lb 9.6 oz (87.4 kg)  Height: 6' (1.829 m)    General: Well developed, well nourished, in no acute distress  Skin : Warm and dry.  Head: Normocephalic and atraumatic  Lungs: Respirations unlabored; clear to auscultation bilaterally without wheeze, rales, rhonchi  CVS exam: normal rate and regular rhythm.  Abdomen: Soft; nontender; nondistended; normoactive bowel sounds; no masses or hepatosplenomegaly  Musculoskeletal: No deformities;  Neurologic: Alert and oriented; speech intact; face symmetrical; moves all extremities well; CNII-XII intact without focal deficit   Assessment:  1. Uncontrolled type 2 diabetes mellitus with hyperglycemia (HCC)   2. Toenail fungus     Plan:  Update labs today- patient defers injectable medication; Rx for Rybelsus 3 mg daily- risks/ benefit discussed; follow up in 2 months- he knows to call for refill/ change to 7 mg in 1 month; Rx for Lamisil 250 mg every day x 12 weeks- hold until has been on Rybelsus for 2 weeks; check CMP today; risks/ benefits discussed;   Return in about 2 months (around 11/04/2023) for follow up.  Orders Placed This Encounter  Procedures   CBC with Differential/Platelet   Comp Met (CMET)    Hemoglobin A1c    Requested Prescriptions   Signed Prescriptions Disp Refills   Semaglutide (RYBELSUS) 3 MG TABS 30 tablet 0    Sig: Take 1 tablet (3 mg total) by mouth daily.   terbinafine (LAMISIL) 250 MG tablet 30 tablet 1    Sig: Take 1 tablet (250 mg total) by mouth daily.

## 2023-09-03 NOTE — Telephone Encounter (Signed)
Spoke with pt, pt is aware of results and expressed understanding.  

## 2023-09-03 NOTE — Telephone Encounter (Signed)
 Pharmacy Patient Advocate Encounter   Received notification from CoverMyMeds that prior authorization for Rybelsus 3MG  tablets is required/requested.   Insurance verification completed.   The patient is insured through [facility] .   Per test claim: PA required; PA submitted to above mentioned insurance via CoverMyMeds Key/confirmation #/EOC [id number] Status is pending

## 2023-09-05 ENCOUNTER — Telehealth: Payer: Self-pay | Admitting: Family

## 2023-09-05 ENCOUNTER — Ambulatory Visit (INDEPENDENT_AMBULATORY_CARE_PROVIDER_SITE_OTHER): Admitting: Physician Assistant

## 2023-09-05 DIAGNOSIS — Z9889 Other specified postprocedural states: Secondary | ICD-10-CM

## 2023-09-05 MED ORDER — METHYLPREDNISOLONE 4 MG PO TBPK: ORAL_TABLET | ORAL | 0 refills | Status: DC

## 2023-09-05 MED ORDER — METHOCARBAMOL 500 MG PO TABS: 500.0000 mg | ORAL_TABLET | Freq: Two times a day (BID) | ORAL | 0 refills | Status: DC | PRN

## 2023-09-05 MED ORDER — HYDROCODONE-ACETAMINOPHEN 5-325 MG PO TABS: 1.0000 | ORAL_TABLET | Freq: Four times a day (QID) | ORAL | 0 refills | Status: DC | PRN

## 2023-09-05 NOTE — Telephone Encounter (Signed)
 Mychart message has been sent to pt.

## 2023-09-05 NOTE — Telephone Encounter (Signed)
 Please call and see how his blood sugar was this morning- we may need to speed up the taper for his medication. Did he use 10 or 12 units last night?  He has been referred to a diabetes specialist and I do want him to schedule there and have them help with his diabetes management going forward.

## 2023-09-05 NOTE — Progress Notes (Signed)
 Post-Op Visit Note   Patient: Austin Meadows           Date of Birth: 1960-12-24           MRN: [id number] Visit Date: 09/05/2023 PCP: Jason Leita Repine, FNP   Assessment & Plan:  Chief Complaint:  Chief Complaint  Patient presents with   Right Knee - Routine Post Op    RIGHT KNEE SCOPE (surgery date 08-29-23)   Visit Diagnoses:  1. S/P right knee arthroscopy     Plan: Patient is a pleasant 63 year old gentleman who comes in today 1 week status post right knee arthroscopic debridement medial meniscus and subchondroplasty 08/29/2023.  He has been doing relatively well.  He is having pain which sounds radicular to the back of the right leg.  No calf pain.  He has been taking Norco and occasional NSAIDs for the pain.  Examination of his right knee reveals well healing surgical portals with nylon sutures in place.  He does have some erythema to the lateral portal but no evidence of infection or cellulitis.  Calves are soft nontender.  He does have a positive straight leg raise on the right.  No focal weakness.  He is neurovascularly intact distally.  Today, sutures were removed and Steri-Strips applied.  Intraoperative pictures reviewed.  Home exercise program provided.  I am not concerned for DVT today, however I feel as though he is having a right lower extremity sciatica.  I refilled his Norco and sent in a Medrol  Dosepak and Robaxin.  I have also recommended that he keep a close eye on his blood sugar over the next week as he is a type II diabetic.  He will follow-up with us  in 5 weeks for recheck.  Call with concerns or questions.  Follow-Up Instructions: Return in about 5 weeks (around 10/10/2023).   Orders:  No orders of the defined types were placed in this encounter.  Meds ordered this encounter  Medications   methylPREDNISolone  (MEDROL  DOSEPAK) 4 MG TBPK tablet    Sig: Take as directed on the package    Dispense:  21 tablet    Refill:  0   HYDROcodone -acetaminophen   (NORCO/VICODIN) 5-325 MG tablet    Sig: Take 1 tablet by mouth every 6 (six) hours as needed for moderate pain (pain score 4-6).    Dispense:  20 tablet    Refill:  0   methocarbamol (ROBAXIN) 500 MG tablet    Sig: Take 1 tablet (500 mg total) by mouth 2 (two) times daily as needed.    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   Uncontrolled type 2 diabetes mellitus with hyperglycemia (HCC) 06/24/2014   Family history of colon cancer 06/09/2014   Past Medical History:  Diagnosis Date   Chicken pox    Diabetes mellitus without complication (HCC)    type 2   Hemorrhoid    Hyperlipidemia    Jaundice of newborn    Rheumatic fever  UTI (lower urinary tract infection)     Family History  Problem Relation Age of Onset   Arthritis Mother    Cancer Mother        bone cancer   Colon cancer Father 79   Prostate cancer Father    Heart disease Father    Hypertension Father    Diabetes Father    Throat cancer Father    Colon cancer Brother 23       late 66's when dx   Cancer Brother        another cancer METS to brain   Colon cancer Brother 72   Colon cancer Other 53       Bill's daughter   Healthy Son        x 4   Healthy Daughter        x 1   Rectal cancer Neg Hx    Stomach cancer Neg Hx    Esophageal cancer Neg Hx     Past Surgical History:  Procedure Laterality Date   COLONOSCOPY  ages [age]   done at [facility](pt  unable to get records)   Social History   Occupational History   Occupation: Child psychotherapist  Tobacco Use   Smoking status: Former    Current packs/day: 0.00    Types: Cigarettes    Quit date: 06/04/1994    Years since quitting: 29.2   Smokeless tobacco: Never  Vaping Use   Vaping status: Never Used  Substance and Sexual Activity   Alcohol use: Not Currently    Alcohol/week: 0.0 standard drinks of alcohol    Comment: Occasionally (one drink per week).  Heavier drinking in the [employer] for ~ 8 years.    Drug use: No   Sexual activity: Not on file

## 2023-09-05 NOTE — Telephone Encounter (Signed)
 Spoke with pt, pt states he woke up late this morning and I didn't have time to check my sugar, pt states yesterday his sugars were running in the 200's. Pt states he started with 10 units last night and 12 units this morning. Pt did check his sugar level while we were on the phone (4:00pm) with 336.

## 2023-09-09 ENCOUNTER — Other Ambulatory Visit (HOSPITAL_COMMUNITY): Payer: Self-pay

## 2023-09-09 NOTE — Telephone Encounter (Signed)
 Pharmacy Patient Advocate Encounter  Received notification from [facility] that Prior Authorization for Rybelsus  3MG  tablets  has been APPROVED from 09/03/23 to 09/02/26. Ran test claim, Copay is $0. This test claim was processed through [facility]- copay amounts may vary at other pharmacies due to pharmacy/plan contracts, or as the patient moves through the different stages of their insurance plan.

## 2023-09-10 ENCOUNTER — Encounter: Payer: Self-pay | Admitting: Family

## 2023-09-27 ENCOUNTER — Encounter: Payer: Self-pay | Admitting: Orthopaedic Surgery

## 2023-09-27 ENCOUNTER — Telehealth: Payer: Self-pay

## 2023-09-27 NOTE — Telephone Encounter (Signed)
 No need to urgently see him.  See last message

## 2023-09-27 NOTE — Telephone Encounter (Signed)
 Called pt and told him to ice his knee and reduce activities for the next couple of days. I also told him to look out for calf pain and/ or feet and leg swelling. I was able to work him in on 7/30 at 8:30 am to see Morna

## 2023-09-27 NOTE — Telephone Encounter (Signed)
 Pt called this morning complaining of pain in his right knee with some popping sensations and swelling of the posterior side. Patient denied any tenderness to the incision, any redness and any discharge. I told the pt I would send a note to [place] and also advised him to send pictures of his knee thorugh MyChart.

## 2023-09-27 NOTE — Telephone Encounter (Signed)
 Ice and back off activity as previously discussed.  If he is concerned, he can come in and see someone if there is an opening this afternoon

## 2023-09-27 NOTE — Telephone Encounter (Signed)
 ok

## 2023-09-27 NOTE — Telephone Encounter (Signed)
 Ice and back off activity.  If calf pain and increased swelling to leg/foot get u/s r/o dvt

## 2023-09-27 NOTE — Telephone Encounter (Signed)
 Sydney called patient.

## 2023-10-02 ENCOUNTER — Ambulatory Visit (INDEPENDENT_AMBULATORY_CARE_PROVIDER_SITE_OTHER): Admitting: Physician Assistant

## 2023-10-02 ENCOUNTER — Telehealth: Payer: Self-pay | Admitting: Orthopaedic Surgery

## 2023-10-02 DIAGNOSIS — Z9889 Other specified postprocedural states: Secondary | ICD-10-CM

## 2023-10-02 NOTE — Progress Notes (Signed)
 Post-Op Visit Note   Patient: Austin Meadows           Date of Birth: 12/29/1960           MRN: [id number] Visit Date: 10/02/2023 PCP: Jason Leita Repine, FNP   Assessment & Plan:  Chief Complaint:  Chief Complaint  Patient presents with   Right Knee - Follow-up    Right knee scope 08/29/2023   Visit Diagnoses:  1. S/P right knee arthroscopy     Plan: Patient is a pleasant 63 year old gentleman who comes in today approximately 5 weeks status post right knee arthroscopic debridement, medial meniscus and subchondroplasty 08/29/2023.  He has been doing much better.  He still notes a little bit of pain as well as weakness with ascending stairs.  He is worried about returning to work next week as he works in a [employer] and has to frequently climb a very steep ladders.  Examination of his right knee reveals range of motion from 0 to 120 degrees.  He does have 4 out of 5 strength with resisted straight leg raise.  He is neurovascularly intact distally.  At this point, I believe he needs to stay out of work for a bit longer to work on Print production planner exercises.  I have offered formal physical therapy but he has opted to work on a home exercise program.  Work note provided to be out of work for the next 2 weeks.  Follow-up with us  as needed.  Call with concerns or questions.  Follow-Up Instructions: Return if symptoms worsen or fail to improve.   Orders:  No orders of the defined types were placed in this encounter.  No orders of the defined types were placed in this encounter.   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   Uncontrolled type 2 diabetes mellitus with hyperglycemia (HCC) 06/24/2014   Family history of colon cancer 06/09/2014   Past Medical History:  Diagnosis Date   Chicken pox    Diabetes mellitus without complication (HCC)    type 2   Hemorrhoid    Hyperlipidemia    Jaundice of newborn    Rheumatic fever    UTI (lower urinary tract infection)     Family History  Problem Relation Age of Onset   Arthritis Mother    Cancer Mother        bone cancer   Colon cancer Father 49   Prostate cancer Father    Heart disease Father    Hypertension Father    Diabetes Father    Throat cancer Father    Colon cancer Brother 53       late 83's when dx   Cancer Brother        another cancer METS to brain   Colon cancer Brother 94   Colon cancer Other 64       Bill's daughter   Healthy Son        x 4   Healthy Daughter        x 1  Rectal cancer Neg Hx    Stomach cancer Neg Hx    Esophageal cancer Neg Hx     Past Surgical History:  Procedure Laterality Date   COLONOSCOPY  ages [age]   done at [facility] and [facility](pt unable to get records)   Social History   Occupational History   Occupation: Child psychotherapist  Tobacco Use   Smoking status: Former    Current packs/day: 0.00    Types: Cigarettes    Quit date: 06/04/1994    Years since quitting: 29.3   Smokeless tobacco: Never  Vaping Use   Vaping status: Never Used  Substance and Sexual Activity   Alcohol use: Not Currently    Alcohol/week: 0.0 standard drinks of alcohol    Comment: Occasionally (one drink per week).  Heavier drinking in the [employer] for ~ 8 years.    Drug use: No   Sexual activity: Not on file

## 2023-10-02 NOTE — Telephone Encounter (Signed)
 Received call from patient. He asked that the [employer] forms from 08/23/23 be faxed to [employer] with oow note from today. I faxed [phone]-[facility].

## 2023-10-08 ENCOUNTER — Encounter: Admitting: Physician Assistant

## 2023-11-04 ENCOUNTER — Other Ambulatory Visit: Payer: Self-pay | Admitting: Family

## 2023-11-04 DIAGNOSIS — E1165 Type 2 diabetes mellitus with hyperglycemia: Secondary | ICD-10-CM

## 2023-11-05 ENCOUNTER — Ambulatory Visit: Admitting: Family

## 2023-11-05 ENCOUNTER — Encounter: Payer: Self-pay | Admitting: Family

## 2023-11-25 ENCOUNTER — Other Ambulatory Visit: Payer: Self-pay | Admitting: Family

## 2023-11-25 DIAGNOSIS — E782 Mixed hyperlipidemia: Secondary | ICD-10-CM

## 2023-12-10 ENCOUNTER — Ambulatory Visit: Admitting: "Endocrinology

## 2023-12-18 ENCOUNTER — Other Ambulatory Visit: Payer: Self-pay | Admitting: Family

## 2023-12-18 MED ORDER — TERBINAFINE HCL 250 MG PO TABS: 250.0000 mg | ORAL_TABLET | Freq: Every day | ORAL | 0 refills | Status: DC

## 2023-12-18 NOTE — Telephone Encounter (Unsigned)
 Copied from CRM #[id number]. Topic: Clinical - Medication Refill >> Dec 18, 2023  8:42 AM Carlyon D wrote: Medication: terbinafine  (LAMISIL ) 250 MG tablet  Has the patient contacted their pharmacy? Yes (Agent: If no, request that the patient contact the pharmacy for the refill. If patient does not wish to contact the pharmacy document the reason why and proceed with request.) (Agent: If yes, when and what did the pharmacy advise?)  This is the patient's preferred pharmacy:  [facility] [address] ([address]), [place] - [address] [place] ([place]) [place] [address] Phone: [phone] Fax: [phone]  Is this the correct pharmacy for this prescription? Yes If no, delete pharmacy and type the correct one.   Has the prescription been filled recently? No  Is the patient out of the medication? Yes  Has the patient been seen for an appointment in the last year OR does the patient have an upcoming appointment? Yes  Can we respond through MyChart? Yes  Agent: Please be advised that Rx refills may take up to 3 business days. We ask that you follow-up with your pharmacy.

## 2023-12-23 ENCOUNTER — Other Ambulatory Visit: Payer: Self-pay | Admitting: Family

## 2023-12-23 ENCOUNTER — Other Ambulatory Visit: Payer: Self-pay | Admitting: Orthopaedic Surgery

## 2023-12-23 DIAGNOSIS — E782 Mixed hyperlipidemia: Secondary | ICD-10-CM

## 2023-12-26 ENCOUNTER — Encounter: Payer: Self-pay | Admitting: "Endocrinology

## 2023-12-26 ENCOUNTER — Ambulatory Visit (INDEPENDENT_AMBULATORY_CARE_PROVIDER_SITE_OTHER): Admitting: "Endocrinology

## 2023-12-26 ENCOUNTER — Other Ambulatory Visit

## 2023-12-26 VITALS — BP 136/80 | HR 86 | Ht 72.0 in | Wt 214.0 lb

## 2023-12-26 DIAGNOSIS — E1165 Type 2 diabetes mellitus with hyperglycemia: Secondary | ICD-10-CM

## 2023-12-26 DIAGNOSIS — E7800 Pure hypercholesterolemia, unspecified: Secondary | ICD-10-CM

## 2023-12-26 DIAGNOSIS — Z794 Long term (current) use of insulin: Secondary | ICD-10-CM

## 2023-12-26 LAB — POCT GLYCOSYLATED HEMOGLOBIN (HGB A1C): Hemoglobin A1C: 8.2 % — AB (ref 4.0–5.6)

## 2023-12-26 MED ORDER — BAQSIMI ONE PACK 3 MG/DOSE NA POWD: 1.0000 | NASAL | 3 refills | Status: AC | PRN

## 2023-12-26 MED ORDER — FREESTYLE LIBRE 3 PLUS SENSOR MISC: 3 refills | Status: AC

## 2023-12-26 MED ORDER — TRESIBA FLEXTOUCH 100 UNIT/ML ~~LOC~~ SOPN: PEN_INJECTOR | SUBCUTANEOUS | 1 refills | Status: DC

## 2023-12-26 NOTE — Progress Notes (Addendum)
 Outpatient Endocrinology Note Austin Birmingham, MD  12/26/23   Austin Meadows 1960/05/05 [id number]  Referring Provider: Jason Leita Repine,* Primary Care Provider: Jason Leita Repine, FNP (Inactive) Reason for consultation: Subjective   Assessment & Plan  Diagnoses and all orders for this visit:  Uncontrolled type 2 diabetes mellitus with hyperglycemia (HCC) -     POCT glycosylated hemoglobin (Hb A1C) -     Microalbumin / creatinine urine ratio -     Lipid panel -     Ambulatory referral to diabetic education -     Comprehensive metabolic panel with GFR -     Ambulatory referral to Ophthalmology -     insulin  degludec (TRESIBA  FLEXTOUCH) 100 UNIT/ML FlexTouch Pen; Sliding scale, max dose 30 units/day  Long-term insulin  use (HCC) -     Ambulatory referral to diabetic education  Pure hypercholesterolemia  Other orders -     Continuous Glucose Sensor (FREESTYLE LIBRE 3 PLUS SENSOR) MISC; Change sensor every 15 days. -     Glucagon (BAQSIMI ONE PACK) 3 MG/DOSE POWD; Place 1 Device into the nose as needed (Low blood sugar with impaired consciousness).   Diabetes Type II complicated by hyperglycemia, neuropathy,  Lab Results  Component Value Date   GFR 87.87 09/03/2023   Hba1c goal less than 7, current Hba1c is  Lab Results  Component Value Date   HGBA1C 8.2 (A) 12/26/2023   Will recommend the following: Tresiba  20 units/day. Increase by 1 units every day until fasting blood sugar is less than 150. Stay on that dose.   Ordered DM education Ordered libre 3+, previously used  No known contraindications/side effects to any of above medications Glucagon discussed and prescribed with refills on 12/26/23   -Last LD and Tg are as follows: Lab Results  Component Value Date   LDLCALC 161 (H) 10/26/2022    Lab Results  Component Value Date   TRIG 88.0 10/26/2022   -On atorvastatin  20 mg QD -Follow low fat diet and exercise   -Blood pressure goal <140/90 -  Microalbumin/creatinine goal is < 30 -Last MA/Cr is as follows: No results found for: MICROALBUR, MALB24HUR -not on ACE/ARB  -diet changes including salt restriction -limit eating outside -counseled BP targets per standards of diabetes care -uncontrolled blood pressure can lead to retinopathy, nephropathy and cardiovascular and atherosclerotic heart disease  Reviewed and counseled on: -A1C target -Blood sugar targets -Complications of uncontrolled diabetes  -Checking blood sugar before meals and bedtime and bring log next visit -All medications with mechanism of action and side effects -Hypoglycemia management: rule of 15's, Glucagon Emergency Kit and medical alert ID -low-carb low-fat plate-method diet -At least 20 minutes of physical activity per day -Annual dilated retinal eye exam and foot exam -compliance and follow up needs -follow up as scheduled or earlier if problem gets worse  Call if blood sugar is less than 70 or consistently above 250    Take a 15 gm snack of carbohydrate at bedtime before you go to sleep if your blood sugar is less than 100.    If you are going to fast after midnight for a test or procedure, ask your physician for instructions on how to reduce/decrease your insulin  dose.    Call if blood sugar is less than 70 or consistently above 250  -Treating a low sugar by rule of 15  (15 gms of sugar every 15 min until sugar is more than 70) If you feel your sugar is low, test your sugar  to be sure If your sugar is low (less than 70), then take 15 grams of a fast acting Carbohydrate (3-4 glucose tablets or glucose gel or 4 ounces of juice or regular soda) Recheck your sugar 15 min after treating low to make sure it is more than 70 If sugar is still less than 70, treat again with 15 grams of carbohydrate          Don't drive the hour of hypoglycemia  If unconscious/unable to eat or drink by mouth, use glucagon injection or nasal spray baqsimi and call 911.  Can repeat again in 15 min if still unconscious.  Return in about 4 weeks (around 01/23/2024).   I have reviewed current medications, nurse's notes, allergies, vital signs, past medical and surgical history, family medical history, and social history for this encounter. Counseled patient on symptoms, examination findings, lab findings, imaging results, treatment decisions and monitoring and prognosis. The patient understood the recommendations and agrees with the treatment plan. All questions regarding treatment plan were fully answered.  Austin Birmingham, MD  12/26/23    History of Present Illness Austin Meadows is a 63 y.o. year old male who presents for evaluation of Type II diabetes mellitus.  Austin Meadows was first diagnosed in 2023.   Diabetes education -  Home diabetes regimen: Tresiba  20 units/day  COMPLICATIONS -  MI/Stroke -  retinopathy +  neuropathy -  nephropathy  SYMPTOMS REVIEWED - Polyuria - Weight loss - Blurred vision  BLOOD SUGAR DATA Not checking BG Did not bring meter  Physical Exam  BP 136/80   Pulse 86   Ht 6' (1.829 m)   Wt 214 lb (97.1 kg)   SpO2 98%   BMI 29.02 kg/m    Constitutional: well developed, well nourished Head: normocephalic, atraumatic Eyes: sclera anicteric, no redness Neck: supple Lungs: normal respiratory effort Neurology: alert and oriented Skin: dry, no appreciable rashes Musculoskeletal: no appreciable defects Psychiatric: normal mood and affect Diabetic Foot Exam - Simple   Simple Foot Form Diabetic Foot exam was performed with the following findings: Yes 12/26/2023  8:41 AM  Visual Inspection No deformities, no ulcerations, no other skin breakdown bilaterally: Yes Sensation Testing Intact to touch and monofilament testing bilaterally: Yes Pulse Check Posterior Tibialis and Dorsalis pulse intact bilaterally: Yes Comments B/L onychomycosis      Current Medications Patient's Medications  New Prescriptions    CONTINUOUS GLUCOSE SENSOR (FREESTYLE LIBRE 3 PLUS SENSOR) MISC    Change sensor every 15 days.   GLUCAGON (BAQSIMI ONE PACK) 3 MG/DOSE POWD    Place 1 Device into the nose as needed (Low blood sugar with impaired consciousness).  Previous Medications   ATORVASTATIN  (LIPITOR) 20 MG TABLET    Take 1 tablet by mouth once daily   BD PEN NEEDLE MINI ULTRAFINE 31G X 5 MM MISC    USE AS DIRECTED   BENZONATATE  (TESSALON ) 200 MG CAPSULE    Take 1 capsule (200 mg total) by mouth 3 (three) times daily as needed for cough.   FLUTICASONE  (FLONASE ) 50 MCG/ACT NASAL SPRAY    Place 2 sprays into both nostrils daily.   HYDROCODONE -ACETAMINOPHEN  (NORCO/VICODIN) 5-325 MG TABLET    Take 1 tablet by mouth 3 (three) times daily as needed for moderate pain (pain score 4-6).   HYDROCODONE -ACETAMINOPHEN  (NORCO/VICODIN) 5-325 MG TABLET    Take 1 tablet by mouth every 6 (six) hours as needed for moderate pain (pain score 4-6).   IBUPROFEN  (ADVIL ) 600 MG TABLET  TAKE 1 TABLET BY MOUTH EVERY 6 HOURS AS NEEDED   LIDOCAINE  (LIDODERM ) 5 %    Place 1 patch onto the skin daily. Remove & Discard patch within 12 hours or as directed by MD   METHOCARBAMOL  (ROBAXIN ) 500 MG TABLET    Take 1 tablet (500 mg total) by mouth 2 (two) times daily as needed.   METHYLPREDNISOLONE  (MEDROL  DOSEPAK) 4 MG TBPK TABLET    Take as directed on the package   ONDANSETRON  (ZOFRAN ) 4 MG TABLET    Take 1 tablet (4 mg total) by mouth every 8 (eight) hours as needed for nausea or vomiting.   ONDANSETRON  (ZOFRAN ) 4 MG TABLET    Take 1 tablet (4 mg total) by mouth every 8 (eight) hours as needed for nausea or vomiting.   PANTOPRAZOLE  (PROTONIX ) 40 MG TABLET    Take 1 tablet (40 mg total) by mouth daily.   PROCTOFOAM  HC RECTAL FOAM    PLACE 1 APPLICATORFUL RECTALLY  THREE TIMES DAILY   TERBINAFINE  (LAMISIL ) 250 MG TABLET    Take 1 tablet (250 mg total) by mouth daily.  Modified Medications   Modified Medication Previous Medication   INSULIN  DEGLUDEC (TRESIBA   FLEXTOUCH) 100 UNIT/ML FLEXTOUCH PEN insulin  degludec (TRESIBA  FLEXTOUCH) 100 UNIT/ML FlexTouch Pen      Sliding scale, max dose 30 units/day    Start with 10 units as directed; taper up to 20 units per night as directed  Discontinued Medications   No medications on file    Allergies Allergies  Allergen Reactions   Influenza Vaccines Other (See Comments)    Guillain barre syndrome   Metformin  And Related Diarrhea   Penicillins Other (See Comments)    Pt can't remember reaction.    Past Medical History Past Medical History:  Diagnosis Date   Chicken pox    Diabetes mellitus without complication (HCC)    type 2   Hemorrhoid    Hyperlipidemia    Jaundice of newborn    Rheumatic fever    UTI (lower urinary tract infection)     Past Surgical History Past Surgical History:  Procedure Laterality Date   COLONOSCOPY  ages [age]   [facility] unable to get records)    Family History family history includes Arthritis in his mother; Cancer in his brother and mother; Colon cancer (age of onset: 72) in an other family member; Colon cancer (age of onset: 75) in his brother; Colon cancer (age of onset: 68) in his brother; Colon cancer (age of onset: 78) in his father; Diabetes in his father; Healthy in his daughter and son; Heart disease in his father; Hypertension in his father; Prostate cancer in his father; Throat cancer in his father.  Social History Social History   Socioeconomic History   Marital status: Married    Spouse name: Not on file   Number of children: 5   Years of education: 14   Highest education level: Not on file  Occupational History   Occupation: Child psychotherapist  Tobacco Use   Smoking status: Former    Current packs/day: 0.00    Types: Cigarettes    Quit date: 06/04/1994    Years since quitting: 29.5   Smokeless tobacco: Never  Vaping Use   Vaping status: Never Used  Substance and Sexual Activity   Alcohol use: Not  Currently    Alcohol/week: 0.0 standard drinks of alcohol    Comment: Occasionally (one drink per week).  Heavier drinking in  the [employer] for ~ 8 years.    Drug use: No   Sexual activity: Not on file  Other Topics Concern   Not on file  Social History Narrative   Currently has 5 children; wife has 3 children.  Lives with his wife; Fun: rides a motorcycle.   Denies any religious beliefs that would effect health care.    Lives in a 2 story home.     Works as a Child psychotherapist.  Education: associate degree.   Right handed   Social Drivers of Corporate investment banker Strain: Not on file  Food Insecurity: Not on file  Transportation Needs: Not on file  Physical Activity: Not on file  Stress: Not on file  Social Connections: Not on file  Intimate Partner Violence: Not on file    Lab Results  Component Value Date   HGBA1C 8.2 (A) 12/26/2023   HGBA1C 14.3 Repeated and verified X2. (H) 09/03/2023   HGBA1C 7.2 (H) 01/30/2023   Lab Results  Component Value Date   CHOL 226 (H) 10/26/2022   Lab Results  Component Value Date   HDL 47.00 10/26/2022   Lab Results  Component Value Date   LDLCALC 161 (H) 10/26/2022   Lab Results  Component Value Date   TRIG 88.0 10/26/2022   Lab Results  Component Value Date   CHOLHDL 5 10/26/2022   Lab Results  Component Value Date   CREATININE 0.94 09/03/2023   Lab Results  Component Value Date   GFR 87.87 09/03/2023   No results found for: MACKEY CURRENT    Component Value Date/Time   NA 132 (L) 09/03/2023 0948   K 4.5 09/03/2023 0948   CL 96 09/03/2023 0948   CO2 29 09/03/2023 0948   GLUCOSE 419 (H) 09/03/2023 0948   BUN 22 09/03/2023 0948   CREATININE 0.94 09/03/2023 0948   CREATININE 0.91 10/28/2020 1434   CALCIUM  9.4 09/03/2023 0948   PROT 6.8 09/03/2023 0948   ALBUMIN 4.4 09/03/2023 0948   AST 11 09/03/2023 0948   ALT 17 09/03/2023 0948   ALKPHOS 74 09/03/2023 0948   BILITOT 0.6 09/03/2023 0948    GFRNONAA >60 09/26/2021 0710      Latest Ref Rng & Units 09/03/2023    9:48 AM 01/30/2023    9:08 AM 10/26/2022   10:06 AM  BMP  Glucose 70 - 99 mg/dL 580  875  865   BUN 6 - 23 mg/dL 22  19  21    Creatinine 0.40 - 1.50 mg/dL 9.05  9.07  9.09   Sodium 135 - 145 mEq/L 132  139  139   Potassium 3.5 - 5.1 mEq/L 4.5  4.5  4.4   Chloride 96 - 112 mEq/L 96  105  103   CO2 19 - 32 mEq/L 29  27  29    Calcium  8.4 - 10.5 mg/dL 9.4  9.1  9.3        Component Value Date/Time   WBC 5.8 09/03/2023 0948   RBC 5.59 09/03/2023 0948   HGB 15.3 09/03/2023 0948   HCT 46.3 09/03/2023 0948   PLT 218.0 09/03/2023 0948   MCV 82.9 09/03/2023 0948   MCH 28.0 09/26/2021 0710   MCHC 33.0 09/03/2023 0948   RDW 13.3 09/03/2023 0948   LYMPHSABS 1.3 09/03/2023 0948   MONOABS 0.6 09/03/2023 0948   EOSABS 0.2 09/03/2023 0948   BASOSABS 0.1 09/03/2023 0948     Parts of this note may have been dictated using  voice recognition software. There may be variances in spelling and vocabulary which are unintentional. Not all errors are proofread. Please notify the dino if any discrepancies are noted or if the meaning of any statement is not clear.

## 2023-12-26 NOTE — Patient Instructions (Addendum)
 Will recommend the following: Tresiba  20 units/day. Increase by 1 units every day until fasting blood sugar is less than 150. Stay on that dose.    ________   Goals of DM therapy:  Morning Fasting blood sugar: 80-140  Blood sugar before meals: 80-140 Bed time blood sugar: 100-150  A1C <7%, limited only by hypoglycemia  1.Diabetes medications and their side effects discussed, including hypoglycemia    2. Check blood glucose:  a) Always check blood sugars before driving. Please see below (under hypoglycemia) on how to manage b) Check a minimum of 3 times/day or more as needed when having symptoms of hypoglycemia.   c) Try to check blood glucose before sleeping/in the middle of the night to ensure that it is remaining stable and not dropping less than 100 d) Check blood glucose more often if sick  3. Diet: a) 3 meals per day schedule b: Restrict carbs to 60-70 grams (4 servings) per meal c) Colorful vegetables - 3 servings a day, and low sugar fruit 2 servings/day Plate control method: 1/4 plate protein, 1/4 starch, 1/2 green, yellow, or red vegetables d) Avoid carbohydrate snacks unless hypoglycemic episode, or increased physical activity  4. Regular exercise as tolerated, preferably 3 or more hours a week  5. Hypoglycemia: a)  Do not drive or operate machinery without first testing blood glucose to assure it is over 90 mg%, or if dizzy, lightheaded, not feeling normal, etc, or  if foot or leg is numb or weak. b)  If blood glucose less than 70, take four 5gm Glucose tabs or 15-30 gm Glucose gel.  Repeat every 15 min as needed until blood sugar is >100 mg/dl. If hypoglycemia persists then call 911.   6. Sick day management: a) Check blood glucose more often b) Continue usual therapy if blood sugars are elevated.   7. Contact the doctor immediately if blood glucose is frequently <60 mg/dl, or an episode of severe hypoglycemia occurs (where someone had to give you glucose/   glucagon or if you passed out from a low blood glucose), or if blood glucose is persistently >350 mg/dl, for further management  8. A change in level of physical activity or exercise and a change in diet may also affect your blood sugar. Check blood sugars more often and call if needed.  Instructions: 1. Bring glucose meter, blood glucose records on every visit for review 2. Continue to follow up with primary care physician and other providers for medical care 3. Yearly eye  and foot exam 4. Please get blood work done prior to the next appointment

## 2023-12-27 LAB — COMPREHENSIVE METABOLIC PANEL WITH GFR
AG Ratio: 2.1 (calc) (ref 1.0–2.5)
ALT: 33 U/L (ref 9–46)
AST: 20 U/L (ref 10–35)
Albumin: 4.8 g/dL (ref 3.6–5.1)
Alkaline phosphatase (APISO): 56 U/L (ref 35–144)
BUN: 15 mg/dL (ref 7–25)
CO2: 30 mmol/L (ref 20–32)
Calcium: 9.6 mg/dL (ref 8.6–10.3)
Chloride: 103 mmol/L (ref 98–110)
Creat: 0.87 mg/dL (ref 0.70–1.35)
Globulin: 2.3 g/dL (ref 1.9–3.7)
Glucose, Bld: 87 mg/dL (ref 65–99)
Potassium: 4.2 mmol/L (ref 3.5–5.3)
Sodium: 139 mmol/L (ref 135–146)
Total Bilirubin: 0.4 mg/dL (ref 0.2–1.2)
Total Protein: 7.1 g/dL (ref 6.1–8.1)
eGFR: 98 mL/min/1.73m2 (ref >=60)

## 2023-12-27 LAB — MICROALBUMIN / CREATININE URINE RATIO
Creatinine, Urine: 53 mg/dL (ref 20–320)
Microalb, Ur: <0.2 mg/dL

## 2023-12-27 LAB — LIPID PANEL
Cholesterol: 184 mg/dL (ref <200)
HDL: 55 mg/dL (ref >=40)
LDL Cholesterol (Calc): 112 mg/dL — ABNORMAL HIGH
Non-HDL Cholesterol (Calc): 129 mg/dL (ref <130)
Total CHOL/HDL Ratio: 3.3 (calc) (ref <5.0)
Triglycerides: 79 mg/dL (ref <150)

## 2024-01-06 ENCOUNTER — Encounter: Payer: Self-pay | Admitting: Radiology

## 2024-01-13 ENCOUNTER — Encounter: Payer: Self-pay | Admitting: Family Medicine

## 2024-01-13 ENCOUNTER — Ambulatory Visit (INDEPENDENT_AMBULATORY_CARE_PROVIDER_SITE_OTHER): Admitting: Family Medicine

## 2024-01-13 VITALS — BP 132/74 | HR 76 | Temp 97.6°F | Ht 72.0 in | Wt 217.6 lb

## 2024-01-13 DIAGNOSIS — B351 Tinea unguium: Secondary | ICD-10-CM

## 2024-01-13 DIAGNOSIS — E1165 Type 2 diabetes mellitus with hyperglycemia: Secondary | ICD-10-CM

## 2024-01-13 DIAGNOSIS — Z794 Long term (current) use of insulin: Secondary | ICD-10-CM

## 2024-01-13 DIAGNOSIS — J309 Allergic rhinitis, unspecified: Secondary | ICD-10-CM | POA: Insufficient documentation

## 2024-01-13 DIAGNOSIS — E782 Mixed hyperlipidemia: Secondary | ICD-10-CM

## 2024-01-13 MED ORDER — TERBINAFINE HCL 250 MG PO TABS: 250.0000 mg | ORAL_TABLET | Freq: Every day | ORAL | 2 refills | Status: AC

## 2024-01-13 MED ORDER — ATORVASTATIN CALCIUM 20 MG PO TABS: 20.0000 mg | ORAL_TABLET | Freq: Every day | ORAL | 3 refills | Status: DC

## 2024-01-13 NOTE — Assessment & Plan Note (Signed)
 Improved on insulin  therapy. Continue insulin  degludec 22 units daily. He will continue to follow with Dr. Dartha.

## 2024-01-13 NOTE — Progress Notes (Signed)
 [facility] [address] [place] [address] Dept: [phone] Dept Fax: [phone]  Transfer of Care Office Visit  Subjective:    [name], male    DOB: 1960/05/05, 63 y.o..   MRN: [id number]  Chief Complaint  Patient presents with   Establish Care    [facility]- establish care.  C/o cough/tickle in throat x 2 months.    History of Present Illness:  Patient is in today to establish care.Austin Meadows was born in [place], [place]. He entered the [place]  [employer] after high school. He was stationed in [place]. [place], [place] and [name]. He served as a solicitor. 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 He has been married for 13 years (2nd marriage). He has 5 children (29-42) and 3 step-children. He has 4 grandchildren and 1 great grandchild. He denies use of tobacco, alcohol, or drugs.  Austin Meadows has a history of hyperlipidemia. He is managed on atorvastatin  20 mg daily.   Austin Meadows has a history of Type 2 diabetes. He is managed on insulin  degludec 22 units q am. Austin Meadows was noted in July to have an A1c= 14.3%. He had been off of his usual exercise routine related to knee surgery he had earlier in the year. He was started on insulin  at that point. His last A1c was improved to 8.2%.  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   Uncontrolled type 2 diabetes mellitus with hyperglycemia (HCC) 06/24/2014   Family history of colon cancer 06/09/2014   Past Surgical History:  Procedure Laterality Date   COLONOSCOPY  ages [age]   [facility] and [facility](pt unable to get records)   Family History  Problem Relation Age of Onset   Arthritis Mother    Cancer Mother        bone cancer   Colon cancer Father 14   Prostate cancer Father    Heart disease Father    Hypertension Father    Diabetes Father    Throat cancer Father    Colon cancer Brother 38       late 32's when dx   Cancer Brother        another cancer METS to brain   Colon cancer Brother 16   Colon cancer Other 34       Bill's daughter   Healthy Son        x 4   Healthy Daughter        x 1   Rectal cancer Neg Hx    Stomach cancer Neg Hx    Esophageal cancer Neg Hx    Outpatient Medications Prior to Visit  Medication Sig Dispense Refill   atorvastatin  (LIPITOR) 20 MG tablet Take 1 tablet by mouth once daily 30 tablet 0   BD PEN NEEDLE MINI ULTRAFINE 31G X 5 MM MISC USE AS DIRECTED  100 each 0   benzonatate  (TESSALON ) 200 MG capsule Take 1 capsule (200 mg total) by mouth 3 (three) times daily as needed for cough. 20 capsule 0   Continuous Glucose Sensor (FREESTYLE LIBRE 3 PLUS SENSOR) MISC Change sensor every 15 days. 6 each 3   fluticasone  (FLONASE ) 50 MCG/ACT nasal spray Place 2 sprays into both nostrils daily. 16 g 6   Glucagon (BAQSIMI ONE PACK) 3 MG/DOSE POWD Place 1 Device into the nose as needed (Low blood sugar with impaired consciousness). 2 each 3   HYDROcodone -acetaminophen  (NORCO/VICODIN) 5-325 MG tablet Take 1 tablet by mouth 3 (three) times daily as needed for moderate pain (pain score 4-6). 30 tablet 0   HYDROcodone -acetaminophen  (NORCO/VICODIN) 5-325 MG tablet Take 1 tablet by mouth every 6 (six) hours as needed for moderate pain (pain score 4-6). 20 tablet 0   ibuprofen  (ADVIL ) 600 MG tablet TAKE 1  TABLET BY MOUTH EVERY 6 HOURS AS NEEDED 60 tablet 0   insulin  degludec (TRESIBA  FLEXTOUCH) 100 UNIT/ML FlexTouch Pen Sliding scale, max dose 30 units/day 15 mL 1   lidocaine  (LIDODERM ) 5 % Place 1 patch onto the skin daily. Remove & Discard patch within 12 hours or as directed by MD 30 patch 0   methocarbamol  (ROBAXIN ) 500 MG tablet Take 1 tablet (500 mg total) by mouth 2 (two) times daily as needed. 20 tablet 0   methylPREDNISolone  (MEDROL  DOSEPAK) 4 MG TBPK tablet Take as directed on the package (Patient not taking: Reported on 12/26/2023) 21 tablet 0   ondansetron  (ZOFRAN ) 4 MG tablet Take 1 tablet (4 mg total) by mouth every 8 (eight) hours as needed for nausea or vomiting. 40 tablet 0   ondansetron  (ZOFRAN ) 4 MG tablet Take 1 tablet (4 mg total) by mouth every 8 (eight) hours as needed for nausea or vomiting. 40 tablet 0   pantoprazole  (PROTONIX ) 40 MG tablet Take 1 tablet (40 mg total) by mouth daily. 90 tablet 3   PROCTOFOAM  HC rectal foam PLACE 1 APPLICATORFUL RECTALLY  THREE TIMES DAILY 10 g 0   terbinafine  (LAMISIL ) 250 MG tablet Take 1 tablet (250 mg total) by mouth daily. 30 tablet 0   No facility-administered medications prior to visit.   Allergies  Allergen Reactions   Influenza Vaccines Other (See Comments)    Guillain barre syndrome   Metformin  And Related Diarrhea   Penicillins Other (See Comments)    Pt can't remember reaction.   Objective:   Today's Vitals   01/13/24 1331  BP: 132/74  Pulse: 76  Temp: 97.6 F (36.4 C)  TempSrc: Temporal  SpO2: 98%  Weight: 217 lb 9.6 oz (98.7 kg)  Height: 6' (1.829 m)   Body mass index is 29.51 kg/m.   General: Well developed, well nourished. No acute distress. Psych: Alert and oriented. Normal mood and affect.  Health Maintenance Due  Topic Date Due   Zoster Vaccines- Shingrix (1 of 2) Never done   Pneumococcal Vaccine: 50+ Years (2 of 2 - PCV) 02/04/2018   OPHTHALMOLOGY EXAM  06/20/2022   Lab Results Lab Results   Component Value Date   HGBA1C 8.2 (A) 12/26/2023   HGBA1C 14.3 Repeated and verified X2. (H) 09/03/2023   HGBA1C 7.2 (H) 01/30/2023   Lab Results  Component Value Date   CHOL 184 12/26/2023   HDL 55 12/26/2023   LDLCALC 112 (H) 12/26/2023   LDLDIRECT 107.0 09/27/2020   TRIG 79 12/26/2023   CHOLHDL 3.3 12/26/2023  Latest Ref Rng & Units 12/26/2023    9:00 AM 09/03/2023    9:48 AM 01/30/2023    9:08 AM  BMP  Glucose 65 - 99 mg/dL 87  580  875   BUN 7 - 25 mg/dL 15  22  19    Creatinine 0.70 - 1.35 mg/dL 9.12  9.05  9.07   BUN/Creat Ratio 6 - 22 (calc) SEE NOTE:     Sodium 135 - 146 mmol/L 139  132  139   Potassium 3.5 - 5.3 mmol/L 4.2  4.5  4.5   Chloride 98 - 110 mmol/L 103  96  105   CO2 20 - 32 mmol/L 30  29  27    Calcium  8.6 - 89.6 mg/dL 9.6  9.4  9.1      Assessment & Plan:   Problem List Items Addressed This Visit       Endocrine   Uncontrolled type 2 diabetes mellitus with hyperglycemia (HCC)   Improved on insulin  therapy. Continue insulin  degludec 22 units daily. He will continue to follow with Dr. Dartha.      Relevant Medications   atorvastatin  (LIPITOR) 20 MG tablet     Musculoskeletal and Integument   Onychomycosis   History of onychomycosis. Currently on 3rd month of terbinafine . Recent LFTs normal. I will continue this for a total of 6 months of therapy.      Relevant Medications   terbinafine  (LAMISIL ) 250 MG tablet     Other   Hyperlipidemia - Primary   Continue atorvastatin  20 mg daily.      Relevant Medications   atorvastatin  (LIPITOR) 20 MG tablet    Return in about 6 months (around 07/12/2024) for Reassessment.   Garnette CHRISTELLA Simpler, MD  I,Emily Lagle,acting as a scribe for Garnette CHRISTELLA Simpler, MD.,have documented all relevant documentation on the behalf of Garnette CHRISTELLA Simpler, MD.  I, Garnette CHRISTELLA Simpler, MD, have reviewed all documentation for this visit. The documentation on 01/13/2024 for the exam, diagnosis, procedures, and orders are all  accurate and complete.

## 2024-01-13 NOTE — Assessment & Plan Note (Signed)
 History of onychomycosis. Currently on 3rd month of terbinafine . Recent LFTs normal. I will continue this for a total of 6 months of therapy.

## 2024-01-13 NOTE — Assessment & Plan Note (Signed)
 Continue atorvastatin 20mg  daily

## 2024-01-23 ENCOUNTER — Ambulatory Visit: Admitting: Orthopaedic Surgery

## 2024-01-23 ENCOUNTER — Encounter: Admitting: Dietician

## 2024-01-27 ENCOUNTER — Ambulatory Visit: Admitting: "Endocrinology

## 2024-02-17 ENCOUNTER — Ambulatory Visit (INDEPENDENT_AMBULATORY_CARE_PROVIDER_SITE_OTHER): Admitting: Emergency Medicine

## 2024-02-17 ENCOUNTER — Encounter: Payer: Self-pay | Admitting: Emergency Medicine

## 2024-02-17 VITALS — BP 130/82 | HR 80 | Resp 16 | Ht 72.0 in | Wt 220.0 lb

## 2024-02-17 DIAGNOSIS — H6993 Unspecified Eustachian tube disorder, bilateral: Secondary | ICD-10-CM

## 2024-02-17 DIAGNOSIS — J019 Acute sinusitis, unspecified: Secondary | ICD-10-CM

## 2024-02-17 MED ORDER — FLUTICASONE PROPIONATE 50 MCG/ACT NA SUSP: 1.0000 | Freq: Every day | NASAL | 6 refills | Status: AC

## 2024-02-17 MED ORDER — DOXYCYCLINE HYCLATE 100 MG PO TABS: 100.0000 mg | ORAL_TABLET | Freq: Two times a day (BID) | ORAL | 0 refills | Status: AC

## 2024-02-17 NOTE — Progress Notes (Signed)
 Assessment & Plan:   Assessment & Plan ETD (Eustachian tube dysfunction), bilateral  Orders:   fluticasone  (FLONASE ) 50 MCG/ACT nasal spray; Place 1 spray into both nostrils daily.  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 Differential includes bacterial infection, allergies, silent reflux. - Prescribed doxycycline  BID for 7 days for bacterial coverage. - Reordered Flonase  nasal spray for inflammation and drainage. - Advised to avoid taking doxycycline  with calcium -rich foods or supplements.  Suspected gastroesophageal reflux disease Suspected silent reflux contributing to chronic cough and mucus production. No classic reflux symptoms, but recent reflux episode noted. - Monitor for reflux symptoms post-treatment for upper airway inflammation. - Consider reflux medication if symptoms persist. Orders:   doxycycline  (VIBRA -TABS) 100 MG tablet; Take 1 tablet (100 mg total) by mouth 2 (two) times daily for 7 days. Take with a full glass of water. Avoid direct sunlight while taking.      Corean Geralds, MSPAS, PA-C    Subjective:   Chief Complaint  Patient presents with   Cough    Inconsistent cough, sneezing and congestion x 2 months.      HPI:  Discussed the use of AI scribe software for clinical note transcription with the patient, who gave verbal consent to proceed.  History of Present Illness Austin Meadows is a 63 year old male who presents with chronic cough and mucus production.  He has had intermittent congestion, cough, sneezing, and thick phlegm for the past couple of months. Cough begins with a throat tickle, then becomes heavy with mucus and eye watering. Episodes occur three to four times daily, worse when lying on his back at night. He denies fever, dyspnea, or chest pain. He had one episode of acid reflux a few days  ago that improved with pantoprazole , which he has used before. He is not currently using allergy pills or nasal sprays, though he has used Flonase  in the past. He is allergic to penicillin.     ROS: Negative unless specifically indicated above in HPI.   Relevant past medical history reviewed and updated as indicated.   Allergies and medications reviewed and updated.  Current Medications[1]  Allergies[2]  Social History[3]   Objective:   Vitals:   02/17/24 1018  BP: 130/82  Pulse: 80  Resp: 16  Height: 6' (1.829 m)  Weight: 220 lb (99.8 kg)  SpO2: 99%  BMI (Calculated): 29.83     Appears well, INAD TM retracted serous effusion bilaterally, canals clear. Nares mucosal edema noted. Cobblestone OP, no active drainage.  Neck supple no LAD Heart RRR Normal resp effort and excursion. CTAB      [1]  Current Outpatient Medications:    atorvastatin  (LIPITOR) 20 MG tablet, Take 1 tablet (20 mg total) by mouth daily., Disp: 90 tablet, Rfl: 3   BD PEN NEEDLE MINI ULTRAFINE 31G X 5 MM MISC, USE AS DIRECTED, Disp: 100 each, Rfl: 0   fluticasone  (FLONASE ) 50 MCG/ACT nasal spray, Place 2 sprays into both nostrils daily., Disp: 16 g, Rfl: 6   Glucagon  (BAQSIMI  ONE PACK) 3 MG/DOSE POWD, Place 1 Device into the nose as needed (Low blood sugar with impaired consciousness)., Disp: 2 each, Rfl: 3   ibuprofen  (ADVIL ) 600 MG tablet, TAKE 1 TABLET BY MOUTH EVERY 6 HOURS AS NEEDED, Disp: 60 tablet, Rfl: 0   insulin  degludec (TRESIBA  FLEXTOUCH) 100 UNIT/ML FlexTouch Pen, Sliding scale, max dose 30 units/day (Patient taking differently: 22  Units. Sliding scale, max dose 30 units/day), Disp: 15 mL, Rfl: 1   lidocaine  (LIDODERM ) 5 %, Place 1 patch onto the skin daily. Remove & Discard patch within 12 hours or as directed by MD, Disp: 30 patch, Rfl: 0   pantoprazole  (PROTONIX ) 40 MG tablet, Take 1 tablet (40 mg total) by mouth daily., Disp: 90 tablet, Rfl: 3   PROCTOFOAM  HC rectal foam, PLACE 1  APPLICATORFUL RECTALLY  THREE TIMES DAILY, Disp: 10 g, Rfl: 0   terbinafine  (LAMISIL ) 250 MG tablet, Take 1 tablet (250 mg total) by mouth daily., Disp: 30 tablet, Rfl: 2   Continuous Glucose Sensor (FREESTYLE LIBRE 3 PLUS SENSOR) MISC, Change sensor every 15 days. (Patient not taking: Reported on 02/17/2024), Disp: 6 each, Rfl: 3   HYDROcodone -acetaminophen  (NORCO/VICODIN) 5-325 MG tablet, Take 1 tablet by mouth 3 (three) times daily as needed for moderate pain (pain score 4-6). (Patient not taking: Reported on 01/13/2024), Disp: 30 tablet, Rfl: 0   methocarbamol  (ROBAXIN ) 500 MG tablet, Take 1 tablet (500 mg total) by mouth 2 (two) times daily as needed. (Patient not taking: Reported on 01/13/2024), Disp: 20 tablet, Rfl: 0   ondansetron  (ZOFRAN ) 4 MG tablet, Take 1 tablet (4 mg total) by mouth every 8 (eight) hours as needed for nausea or vomiting., Disp: 40 tablet, Rfl: 0 [2]  Allergies Allergen Reactions   Influenza Vaccines Other (See Comments)    Guillain barre syndrome   Metformin  And Related Diarrhea   Penicillins Other (See Comments)    Pt can't remember reaction.  [3]  Social History Tobacco Use   Smoking status: Former    Current packs/day: 0.00    Types: Cigarettes    Quit date: 06/04/1994    Years since quitting: 29.7   Smokeless tobacco: Never  Vaping Use   Vaping status: Never Used  Substance Use Topics   Alcohol use: Not Currently    Alcohol/week: 0.0 standard drinks of alcohol    Comment: Occasionally (one drink per week).  Heavier drinking in the [employer] for ~ 8 years.    Drug use: No

## 2024-03-03 ENCOUNTER — Telehealth: Admitting: "Endocrinology

## 2024-03-05 ENCOUNTER — Encounter: Payer: Self-pay | Admitting: Family Medicine

## 2024-03-06 ENCOUNTER — Encounter: Payer: Self-pay | Admitting: "Endocrinology

## 2024-03-06 ENCOUNTER — Telehealth: Admitting: "Endocrinology

## 2024-03-06 VITALS — Ht 72.0 in | Wt 220.0 lb

## 2024-03-06 DIAGNOSIS — Z794 Long term (current) use of insulin: Secondary | ICD-10-CM | POA: Diagnosis not present

## 2024-03-06 DIAGNOSIS — E1165 Type 2 diabetes mellitus with hyperglycemia: Secondary | ICD-10-CM

## 2024-03-06 DIAGNOSIS — E7800 Pure hypercholesterolemia, unspecified: Secondary | ICD-10-CM

## 2024-03-06 MED ORDER — TRESIBA FLEXTOUCH 100 UNIT/ML ~~LOC~~ SOPN: 22.0000 [IU] | PEN_INJECTOR | Freq: Every day | SUBCUTANEOUS | 3 refills | Status: AC

## 2024-03-06 MED ORDER — PEN NEEDLES 32G X 4 MM MISC: 1.0000 | Freq: Four times a day (QID) | 2 refills | Status: AC

## 2024-03-06 MED ORDER — LANCETS MISC: 1.0000 | 11 refills | Status: AC

## 2024-03-06 MED ORDER — ROSUVASTATIN CALCIUM 40 MG PO TABS: 40.0000 mg | ORAL_TABLET | Freq: Every day | ORAL | 1 refills | Status: AC

## 2024-03-06 NOTE — Progress Notes (Signed)
 "  The patient reports they are currently: [name]. I spent 7 minutes on the video with the patient on the date of service. I spent an additional 5 minutes on pre- and post-visit activities on the date of service.   The patient was physically located in [name]  or a state in which I am permitted to provide care. The patient and/or parent/guardian understood that s/he may incur co-pays and cost sharing, and agreed to the telemedicine visit. The visit was reasonable and appropriate under the circumstances given the patient's presentation at the time.  The patient and/or parent/guardian understands the potential risks and limitations of this mode of treatment (including, but not limited to, the absence of in-person examination) and has agreed to be treated using telemedicine. The patient's/patient's family's questions regarding telemedicine have been answered.   The patient and/or parent/guardian will contact their provider's office for worsening conditions, and seek emergency medical treatment and/or call 911 if the patient deems either necessary.    Outpatient Endocrinology Note Austin Birmingham, MD  03/06/2024   Austin Meadows 07/22/1960 [id number]  Referring Provider: Thedora Garnette HERO, MD Primary Care Provider: Thedora Garnette HERO, MD Reason for consultation: Subjective   Assessment & Plan  Diagnoses and all orders for this visit:  Uncontrolled type 2 diabetes mellitus with hyperglycemia (HCC) -     insulin  degludec (TRESIBA  FLEXTOUCH) 100 UNIT/ML FlexTouch Pen; Inject 22 Units into the skin daily. Sliding scale, max dose 30 units/day  Long-term insulin  use (HCC)  Pure hypercholesterolemia  Other orders -     Insulin  Pen Needle (PEN NEEDLES) 32G X 4 MM MISC; 1 Device by Does not apply route in the morning, at noon, in the evening, and at bedtime. -     rosuvastatin (CRESTOR) 40 MG tablet; Take 1 tablet (40 mg total) by mouth daily. -     Lancets MISC; 1 each by Does not apply route as  directed. Dispense based on patient and insurance preference. Use up to four times daily as directed. (FOR ICD-10 E10.9, E11.9).    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   Ordered DM education previously   [employer]+, previously used-not covered  No known contraindications/side effects to any of above medications Glucagon  discussed and prescribed with refills on 12/26/23   -Last LD and Tg are as follows: Lab Results  Component Value Date   LDLCALC 112 (H) 12/26/2023    Lab Results  Component Value Date   TRIG 79 12/26/2023   -On atorvastatin  20 mg every day: LDL 112 -03/07/23 Start rosuvastatin 40 mg every day  -Follow low fat diet and exercise   -Blood pressure goal <140/90 - Microalbumin/creatinine goal is < 30 -Last MA/Cr is as follows: Lab Results  Component Value Date   MICROALBUR <0.2 12/26/2023   -not on ACE/ARB  -diet changes including salt restriction -limit eating outside -counseled BP targets per standards of diabetes care -uncontrolled blood pressure can lead to retinopathy, nephropathy and cardiovascular and atherosclerotic heart disease  Reviewed and counseled on: -A1C target -Blood sugar targets -Complications of uncontrolled diabetes  -Checking blood sugar before meals and bedtime and bring log next visit -All medications with mechanism of action and side effects -Hypoglycemia management: rule of 15's, Glucagon   Emergency Kit and medical alert ID -low-carb low-fat plate-method diet -At least 20 minutes of physical activity per day -Annual dilated retinal eye exam and foot exam -compliance and follow up needs -follow up as scheduled or earlier if problem gets worse  Call if blood sugar is less than 70 or  consistently above 250    Take a 15 gm snack of carbohydrate at bedtime before you go to sleep if your blood sugar is less than 100.    If you are going to fast after midnight for a test or procedure, ask your physician for instructions on how to reduce/decrease your insulin  dose.    Call if blood sugar is less than 70 or consistently above 250  -Treating a low sugar by rule of 15  (15 gms of sugar every 15 min until sugar is more than 70) If you feel your sugar is low, test your sugar to be sure If your sugar is low (less than 70), then take 15 grams of a fast acting Carbohydrate (3-4 glucose tablets or glucose gel or 4 ounces of juice or regular soda) Recheck your sugar 15 min after treating low to make sure it is more than 70 If sugar is still less than 70, treat again with 15 grams of carbohydrate          Don't drive the hour of hypoglycemia  If unconscious/unable to eat or drink by mouth, use glucagon  injection or nasal spray baqsimi  and call 911. Can repeat again in 15 min if still unconscious.  No follow-ups on file.   I have reviewed current medications, nurse's notes, allergies, vital signs, past medical and surgical history, family medical history, and social history for this encounter. Counseled patient on symptoms, examination findings, lab findings, imaging results, treatment decisions and monitoring and prognosis. The patient understood the recommendations and agrees with the treatment plan. All questions regarding treatment plan were fully answered.  Austin Birmingham, MD  03/06/2024    History of Present Illness Austin Meadows is a 64 y.o. year old male who presents for evaluation of Type II diabetes mellitus.  Austin Meadows was first diagnosed in 2023.   Diabetes education -  Home diabetes regimen: Tresiba  22 units/day  COMPLICATIONS -  MI/Stroke -  retinopathy +  neuropathy -  nephropathy  BLOOD SUGAR DATA Checking 5 times a week Per recall: 94-112  Physical  Exam  Ht 6' (1.829 m)   Wt 220 lb (99.8 kg)   BMI 29.84 kg/m    Constitutional: well developed, well nourished Head: normocephalic, atraumatic Eyes: sclera anicteric, no redness Neck: supple Lungs: normal respiratory effort Neurology: alert and oriented Skin: dry, no appreciable rashes Musculoskeletal: no appreciable defects Psychiatric: normal mood and affect Diabetic Foot Exam - Simple   No data filed      Current Medications Patient's Medications  New Prescriptions   INSULIN  PEN NEEDLE (PEN NEEDLES) 32G X 4 MM MISC    1 Device by Does not apply route in the morning, at noon, in the evening, and at bedtime.   LANCETS MISC    1 each by Does not apply route as directed. Dispense based on patient and insurance preference. Use up to four times daily as directed. (FOR ICD-10 E10.9, E11.9).   ROSUVASTATIN (CRESTOR) 40 MG TABLET    Take 1 tablet (40 mg total) by mouth daily.  Previous Medications   CONTINUOUS GLUCOSE SENSOR (FREESTYLE LIBRE 3 PLUS SENSOR) MISC    Change sensor every 15 days.  FLUTICASONE  (FLONASE ) 50 MCG/ACT NASAL SPRAY    Place 1 spray into both nostrils daily.   GLUCAGON  (BAQSIMI  ONE PACK) 3 MG/DOSE POWD    Place 1 Device into the nose as needed (Low blood sugar with impaired consciousness).   HYDROCODONE -ACETAMINOPHEN  (NORCO/VICODIN) 5-325 MG TABLET    Take 1 tablet by mouth 3 (three) times daily as needed for moderate pain (pain score 4-6).   IBUPROFEN  (ADVIL ) 600 MG TABLET    TAKE 1 TABLET BY MOUTH EVERY 6 HOURS AS NEEDED   LIDOCAINE  (LIDODERM ) 5 %    Place 1 patch onto the skin daily. Remove & Discard patch within 12 hours or as directed by MD   METHOCARBAMOL  (ROBAXIN ) 500 MG TABLET    Take 1 tablet (500 mg total) by mouth 2 (two) times daily as needed.   ONDANSETRON  (ZOFRAN ) 4 MG TABLET    Take 1 tablet (4 mg total) by mouth every 8 (eight) hours as needed for nausea or vomiting.   PANTOPRAZOLE  (PROTONIX ) 40 MG TABLET    Take 1 tablet (40 mg total) by mouth  daily.   PROCTOFOAM  HC RECTAL FOAM    PLACE 1 APPLICATORFUL RECTALLY  THREE TIMES DAILY   TERBINAFINE  (LAMISIL ) 250 MG TABLET    Take 1 tablet (250 mg total) by mouth daily.  Modified Medications   Modified Medication Previous Medication   INSULIN  DEGLUDEC (TRESIBA  FLEXTOUCH) 100 UNIT/ML FLEXTOUCH PEN insulin  degludec (TRESIBA  FLEXTOUCH) 100 UNIT/ML FlexTouch Pen      Inject 22 Units into the skin daily. Sliding scale, max dose 30 units/day    Sliding scale, max dose 30 units/day  Discontinued Medications   ATORVASTATIN  (LIPITOR) 20 MG TABLET    Take 1 tablet (20 mg total) by mouth daily.   BD PEN NEEDLE MINI ULTRAFINE 31G X 5 MM MISC    USE AS DIRECTED    Allergies Allergies  Allergen Reactions   Influenza Vaccines Other (See Comments)    Guillain barre syndrome   Metformin  And Related Diarrhea   Penicillins Other (See Comments)    Pt can't remember reaction.    Past Medical History Past Medical History:  Diagnosis Date   Chicken pox    Diabetes mellitus without complication (HCC)    type 2   Hemorrhoid    Hyperlipidemia    Jaundice of newborn    Rheumatic fever    UTI (lower urinary tract infection)     Past Surgical History Past Surgical History:  Procedure Laterality Date   COLONOSCOPY  ages [age]   done at [facility] and [facility](pt unable to get records)    Family History family history includes Arthritis in his mother; Cancer in his brother, brother, brother, father, mother, and sister; Colon cancer (age of onset: 19) in an other family member; Colon cancer (age of onset: 78) in his brother; Colon cancer (age of onset: 53) in his brother; Diabetes in his brother, brother, father, mother, and sister; Healthy in his daughter and son; Heart disease in his brother and father; Hypertension in his father; Parkinson's disease in his brother; Stroke in his brother and sister.  Social History Social History   Socioeconomic History   Marital status: Married     Spouse name: Not on file   Number of children: 5   Years of education: 14   Highest education level: Not on file  Occupational History   Occupation: Child Psychotherapist    Comment: [employer]  Tobacco Use   Smoking status: Former  Current packs/day: 0.00    Types: Cigarettes    Quit date: 06/04/1994    Years since quitting: 29.7   Smokeless tobacco: Never  Vaping Use   Vaping status: Never Used  Substance and Sexual Activity   Alcohol use: Not Currently    Alcohol/week: 0.0 standard drinks of alcohol    Comment: Occasionally (one drink per week).  Heavier drinking in the [employer] for ~ 8 years.    Drug use: No   Sexual activity: Yes  Other Topics Concern   Not on file  Social History Narrative   Currently has 5 children; wife has 3 children.  Lives with his wife; Fun: rides a motorcycle.   Denies any religious beliefs that would effect health care.    Lives in a 2 story home.     Works as a child psychotherapist.  Education: associate degree.   Right handed   Social Drivers of Health   Tobacco Use: Medium Risk (03/06/2024)   Patient History    Smoking Tobacco Use: Former    Smokeless Tobacco Use: Never    Passive Exposure: Not on [place] Strain: Not on file  Food Insecurity: Not on file  Transportation Needs: Not on file  Physical Activity: Not on file  Stress: Not on file  Social Connections: Not on file  Intimate Partner Violence: Not on file  Depression (PHQ2-9): Low Risk (01/13/2024)   Depression (PHQ2-9)    PHQ-2 Score: 0  Alcohol Screen: Not on file  Housing: Not on file  Utilities: Not on file  Health Literacy: Not on file    Lab Results  Component Value Date   HGBA1C 8.2 (A) 12/26/2023   HGBA1C 14.3 Repeated and verified X2. (H) 09/03/2023   HGBA1C 7.2 (H) 01/30/2023   Lab Results  Component Value Date   CHOL 184 12/26/2023   Lab Results  Component Value Date   HDL 55 12/26/2023   Lab Results  Component Value Date   LDLCALC 112  (H) 12/26/2023   Lab Results  Component Value Date   TRIG 79 12/26/2023   Lab Results  Component Value Date   CHOLHDL 3.3 12/26/2023   Lab Results  Component Value Date   CREATININE 0.87 12/26/2023   Lab Results  Component Value Date   GFR 87.87 09/03/2023   Lab Results  Component Value Date   MICROALBUR <0.2 12/26/2023      Component Value Date/Time   NA 139 12/26/2023 0900   K 4.2 12/26/2023 0900   CL 103 12/26/2023 0900   CO2 30 12/26/2023 0900   GLUCOSE 87 12/26/2023 0900   BUN 15 12/26/2023 0900   CREATININE 0.87 12/26/2023 0900   CALCIUM  9.6 12/26/2023 0900   PROT 7.1 12/26/2023 0900   ALBUMIN 4.4 09/03/2023 0948   AST 20 12/26/2023 0900   ALT 33 12/26/2023 0900   ALKPHOS 74 09/03/2023 0948   BILITOT 0.4 12/26/2023 0900   GFRNONAA >60 09/26/2021 0710      Latest Ref Rng & Units 12/26/2023    9:00 AM 09/03/2023    9:48 AM 01/30/2023    9:08 AM  BMP  Glucose 65 - 99 mg/dL 87  580  875   BUN 7 - 25 mg/dL 15  22  19    Creatinine 0.70 - 1.35 mg/dL 9.12  9.05  9.07   BUN/Creat Ratio 6 - 22 (calc) SEE NOTE:     Sodium 135 - 146 mmol/L 139  132  139  Potassium 3.5 - 5.3 mmol/L 4.2  4.5  4.5   Chloride 98 - 110 mmol/L 103  96  105   CO2 20 - 32 mmol/L 30  29  27    Calcium  8.6 - 10.3 mg/dL 9.6  9.4  9.1        Component Value Date/Time   WBC 5.8 09/03/2023 0948   RBC 5.59 09/03/2023 0948   HGB 15.3 09/03/2023 0948   HCT 46.3 09/03/2023 0948   PLT 218.0 09/03/2023 0948   MCV 82.9 09/03/2023 0948   MCH 28.0 09/26/2021 0710   MCHC 33.0 09/03/2023 0948   RDW 13.3 09/03/2023 0948   LYMPHSABS 1.3 09/03/2023 0948   MONOABS 0.6 09/03/2023 0948   EOSABS 0.2 09/03/2023 0948   BASOSABS 0.1 09/03/2023 0948     Parts of this note may have been dictated using voice recognition software. There may be variances in spelling and vocabulary which are unintentional. Not all errors are proofread. Please notify the dino if any discrepancies are noted or if the meaning  of any statement is not clear.   "

## 2024-03-11 ENCOUNTER — Ambulatory Visit: Admitting: Internal Medicine

## 2024-03-11 ENCOUNTER — Encounter: Payer: Self-pay | Admitting: Internal Medicine

## 2024-03-11 VITALS — BP 130/82 | HR 69 | Temp 97.4°F | Ht 73.0 in | Wt 222.2 lb

## 2024-03-11 DIAGNOSIS — R0982 Postnasal drip: Secondary | ICD-10-CM

## 2024-03-11 DIAGNOSIS — K219 Gastro-esophageal reflux disease without esophagitis: Secondary | ICD-10-CM

## 2024-03-11 MED ORDER — AZELASTINE HCL 0.1 % NA SOLN: 1.0000 | Freq: Two times a day (BID) | NASAL | 5 refills | Status: AC

## 2024-03-11 MED ORDER — PANTOPRAZOLE SODIUM 40 MG PO TBEC: 40.0000 mg | DELAYED_RELEASE_TABLET | Freq: Every day | ORAL | 0 refills | Status: AC

## 2024-03-11 NOTE — Progress Notes (Signed)
 " [facility] [address] [place] [address] Dept: [phone] Dept Fax: [phone]  Acute Care Office Visit  Subjective:   Austin Meadows [birth year] 03/11/2024  Chief Complaint  Patient presents with   Cough    2 month cough, sore throat, headache    HPI:  Discussed the use of AI scribe software for clinical note transcription with the patient, who gave verbal consent to proceed.  History of Present Illness   Austin Meadows is a 64 year old male who presents with a persistent cough.  He has been experiencing a persistent cough for approximately two months, described as being triggered by a 'tickle' in his throat, leading to coughing and production of phlegm. The cough worsens when he lies down. He also experiences sneezing and a runny nose.  He was previously seen by a primary care provider on December 15th, 2025, and was diagnosed with subacute sinusitis and Eustachian tube dysfunction. He was prescribed doxycycline  and a Flonase  nasal spray, but these treatments did not alleviate his symptoms. He has also tried over-the-counter medications such as Mucinex and Delsym. Delsym was effective in suppressing the cough for about 12 hours, but it caused grogginess.  He reports occasional sore throat, particularly at night, and hoarseness in the morning that improves throughout the day. He has a history of allergies and has used Flonase  in the past, which he found ineffective. He occasionally takes pantoprazole  for acid reflux, which he has used twice since the onset of his current symptoms, with noting a potential improvement impact in cough but he is not for sure.      The following portions of the patient's history were reviewed and updated as appropriate: past medical history, past surgical history, family history, social history, allergies, medications, and problem list.   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   Uncontrolled type 2 diabetes mellitus with hyperglycemia (HCC) 06/24/2014   Past Medical History:  Diagnosis Date   Chicken pox    Diabetes mellitus without complication (HCC)    type 2   Hemorrhoid    Hyperlipidemia    Jaundice of newborn    Rheumatic fever    UTI (lower urinary tract infection)    Past Surgical History:  Procedure Laterality Date   COLONOSCOPY  ages [age]   done at [facility](pt unable to get records)   Family History  Problem Relation Age of Onset   Diabetes Mother    Arthritis Mother    Cancer Mother        Bone   Cancer Father        throat, esophageal, stomach   Heart disease Father    Hypertension Father    Diabetes Father    Stroke Sister    Diabetes Sister    Cancer Sister        Pancreatic   Colon cancer Brother 44       late 4's when dx   Cancer Brother        Stomach   Cancer Brother        Pancreatic   Diabetes Brother    Heart disease Brother    Colon cancer Brother 25   Stroke Brother    Diabetes Brother  Cancer Brother        Prostate   Parkinson's disease Brother    Healthy Daughter        x 1   Healthy Son        x 4   Colon cancer Other 28       Bill's daughter   Rectal cancer Neg Hx    Stomach cancer Neg Hx    Esophageal cancer Neg Hx    Current Medications[1] Allergies[2]   ROS: A complete ROS was performed with pertinent positives/negatives noted in the HPI. The remainder of the ROS are negative.    Objective:   Today's Vitals   03/11/24 0941  BP: 130/82  Pulse: 69  Temp: (!) 97.4 F (36.3 C)  TempSrc: Oral  SpO2: 98%  Weight: 222 lb 3.2 oz (100.8 kg)  Height: 6' 1 (1.854 m)    GENERAL: Well-appearing, in NAD. Well nourished.   SKIN: Pink, warm and dry. No rash, lesion, ulceration, or ecchymoses.  HEENT:    HEAD: Normocephalic, non-traumatic.  EYES: Conjunctive pink without exudate.  EARS: External ear w/o redness, swelling, masses, or lesions. EAC clear. TM's intact, translucent w/o bulging, appropriate landmarks visualized.  NOSE: Septum midline w/o deformity. Nares patent, mucosa pink and inflamed with clear drainage. No sinus tenderness.  THROAT: Uvula midline. Oropharynx with clear PND. Tonsils non-inflamed w/o exudate. Mucus membranes pink and moist.  NECK: Trachea midline. Full ROM w/o pain or tenderness. No lymphadenopathy.  RESPIRATORY: Chest wall symmetrical. Respirations even and non-labored. Breath sounds clear to auscultation bilaterally.  CARDIAC: S1, S2 present, regular rate and rhythm. Peripheral pulses 2+ bilaterally.  EXTREMITIES: Without clubbing, cyanosis, or edema.  PSYCH/MENTAL STATUS: Alert, oriented x 3. Cooperative, appropriate mood and affect.    No results found for any visits on 03/11/24.    Assessment & Plan:  Assessment and Plan    Postnasal drip  - Discontinued Flonase . - Prescribed Azelastine  nasal spray 1 spray each nostril BID  Suspected gastroesophageal reflux disease Possible silent acid reflux contributing to cough.  - Prescribed pantoprazole  40mg  once daily      Meds ordered this encounter  Medications   azelastine  (ASTELIN ) 0.1 % nasal spray    Sig: Place 1 spray into both nostrils 2 (two) times daily.    Dispense:  30 mL    Refill:  5    Supervising Provider:   THOMPSON, AARON B [[id number]]   pantoprazole  (PROTONIX ) 40 MG tablet    Sig: Take 1 tablet (40 mg total) by mouth daily.    Dispense:  90 tablet    Refill:  0    Supervising Provider:   THOMPSON, AARON B [[id number]]   No orders of the defined types were placed in this encounter.  Lab Orders  No laboratory test(s) ordered today   No images are attached to the encounter or orders placed in the  encounter.  Return if symptoms worsen or fail to improve.   Rosina Senters, FNP     [1]  Current Outpatient Medications:    azelastine  (ASTELIN ) 0.1 % nasal spray, Place 1 spray into both nostrils 2 (two) times daily., Disp: 30 mL, Rfl: 5   Continuous Glucose Sensor (FREESTYLE LIBRE 3 PLUS SENSOR) MISC, Change sensor every 15 days., Disp: 6 each, Rfl: 3   EMBECTA PEN NEEDLE ULTRAFINE 31G X 5 MM MISC, as directed., Disp: , Rfl:    fluticasone  (FLONASE ) 50 MCG/ACT nasal spray, Place 1 spray into both nostrils daily., Disp: 16 g,  Rfl: 6   Glucagon  (BAQSIMI  ONE PACK) 3 MG/DOSE POWD, Place 1 Device into the nose as needed (Low blood sugar with impaired consciousness)., Disp: 2 each, Rfl: 3   ibuprofen  (ADVIL ) 600 MG tablet, TAKE 1 TABLET BY MOUTH EVERY 6 HOURS AS NEEDED, Disp: 60 tablet, Rfl: 0   insulin  degludec (TRESIBA  FLEXTOUCH) 100 UNIT/ML FlexTouch Pen, Inject 22 Units into the skin daily. Sliding scale, max dose 30 units/day, Disp: 21 mL, Rfl: 3   Insulin  Pen Needle (PEN NEEDLES) 32G X 4 MM MISC, 1 Device by Does not apply route in the morning, at noon, in the evening, and at bedtime., Disp: 200 each, Rfl: 2   Lancets MISC, 1 each by Does not apply route as directed. Dispense based on patient and insurance preference. Use up to four times daily as directed. (FOR ICD-10 E10.9, E11.9)., Disp: 100 each, Rfl: 11   PROCTOFOAM  HC rectal foam, PLACE 1 APPLICATORFUL RECTALLY  THREE TIMES DAILY, Disp: 10 g, Rfl: 0   rosuvastatin  (CRESTOR ) 40 MG tablet, Take 1 tablet (40 mg total) by mouth daily., Disp: 90 tablet, Rfl: 1   terbinafine  (LAMISIL ) 250 MG tablet, Take 1 tablet (250 mg total) by mouth daily., Disp: 30 tablet, Rfl: 2   lidocaine  (LIDODERM ) 5 %, Place 1 patch onto the skin daily. Remove & Discard patch within 12 hours or as directed by MD (Patient not taking: Reported on 03/11/2024), Disp: 30 patch, Rfl: 0   pantoprazole  (PROTONIX ) 40 MG tablet, Take 1 tablet (40 mg total) by mouth daily., Disp:  90 tablet, Rfl: 0 [2]  Allergies Allergen Reactions   Influenza Vaccines Other (See Comments)    Guillain barre syndrome   Metformin  And Related Diarrhea   Penicillins Other (See Comments)    Pt can't remember reaction.   "

## 2024-04-03 ENCOUNTER — Telehealth: Payer: Self-pay | Admitting: Orthopaedic Surgery

## 2024-04-03 NOTE — Telephone Encounter (Signed)
 Slater from [place] [employer] called wanting to verify info on pt. Call back number is [phone].

## 2024-05-14 ENCOUNTER — Ambulatory Visit: Admitting: "Endocrinology

## 2024-05-28 ENCOUNTER — Encounter: Admitting: Family Medicine

## 2024-07-17 ENCOUNTER — Ambulatory Visit: Admitting: Family Medicine
